# Patient Record
Sex: Male | Born: 1940 | Race: White | Hispanic: No | Marital: Married | Smoking: Former smoker
Health system: Southern US, Community
[De-identification: ages and names within clinical notes are randomized; demographics above are authoritative.]

## PROBLEM LIST (undated history)

## (undated) DIAGNOSIS — E669 Obesity, unspecified: Secondary | ICD-10-CM

## (undated) DIAGNOSIS — B0053 Herpesviral conjunctivitis: Secondary | ICD-10-CM

## (undated) DIAGNOSIS — R0600 Dyspnea, unspecified: Secondary | ICD-10-CM

## (undated) DIAGNOSIS — I2510 Atherosclerotic heart disease of native coronary artery without angina pectoris: Secondary | ICD-10-CM

## (undated) DIAGNOSIS — K5900 Constipation, unspecified: Secondary | ICD-10-CM

## (undated) DIAGNOSIS — K219 Gastro-esophageal reflux disease without esophagitis: Secondary | ICD-10-CM

## (undated) DIAGNOSIS — E871 Hypo-osmolality and hyponatremia: Secondary | ICD-10-CM

## (undated) DIAGNOSIS — R634 Abnormal weight loss: Secondary | ICD-10-CM

## (undated) DIAGNOSIS — R0609 Other forms of dyspnea: Secondary | ICD-10-CM

## (undated) DIAGNOSIS — E785 Hyperlipidemia, unspecified: Secondary | ICD-10-CM

## (undated) DIAGNOSIS — M25511 Pain in right shoulder: Principal | ICD-10-CM

## (undated) DIAGNOSIS — I10 Essential (primary) hypertension: Secondary | ICD-10-CM

## (undated) DIAGNOSIS — M1990 Unspecified osteoarthritis, unspecified site: Secondary | ICD-10-CM

## (undated) DIAGNOSIS — D539 Nutritional anemia, unspecified: Principal | ICD-10-CM

## (undated) DIAGNOSIS — E119 Type 2 diabetes mellitus without complications: Secondary | ICD-10-CM

## (undated) HISTORY — DX: Nutritional anemia, unspecified: D53.9

## (undated) HISTORY — DX: Other forms of dyspnea: R06.09

## (undated) HISTORY — DX: Essential (primary) hypertension: I10

## (undated) HISTORY — DX: Hyperlipidemia, unspecified: E78.5

## (undated) HISTORY — DX: Herpesviral conjunctivitis: B00.53

## (undated) HISTORY — PX: HERNIA REPAIR: SHX51

## (undated) HISTORY — DX: Gastro-esophageal reflux disease without esophagitis: K21.9

## (undated) HISTORY — DX: Atherosclerotic heart disease of native coronary artery without angina pectoris: I25.10

## (undated) HISTORY — DX: Unspecified osteoarthritis, unspecified site: M19.90

## (undated) HISTORY — DX: Hypo-osmolality and hyponatremia: E87.1

## (undated) HISTORY — DX: Obesity, unspecified: E66.9

## (undated) HISTORY — DX: Constipation, unspecified: K59.00

## (undated) HISTORY — DX: Abnormal weight loss: R63.4

## (undated) HISTORY — DX: Dyspnea, unspecified: R06.00

## (undated) HISTORY — DX: Type 2 diabetes mellitus without complications: E11.9

## (undated) HISTORY — DX: Pain in right shoulder: M25.511

---
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
## 1986-05-29 HISTORY — PX: BACK SURGERY: SHX140

## 2008-05-29 HISTORY — PX: CARDIAC CATHETERIZATION: SHX172

## 2009-03-29 HISTORY — PX: TRANSTHORACIC ECHOCARDIOGRAM: SHX275

## 2009-04-07 ENCOUNTER — Ambulatory Visit (HOSPITAL_COMMUNITY): Admission: RE | Admit: 2009-04-07 | Discharge: 2009-04-07 | Payer: Self-pay | Admitting: Cardiology

## 2010-08-31 LAB — GLUCOSE, CAPILLARY: Glucose-Capillary: 128 mg/dL — ABNORMAL HIGH (ref 70–99)

## 2011-06-08 DIAGNOSIS — E11311 Type 2 diabetes mellitus with unspecified diabetic retinopathy with macular edema: Secondary | ICD-10-CM | POA: Diagnosis not present

## 2011-06-08 DIAGNOSIS — E1139 Type 2 diabetes mellitus with other diabetic ophthalmic complication: Secondary | ICD-10-CM | POA: Diagnosis not present

## 2011-07-04 DIAGNOSIS — E7800 Pure hypercholesterolemia, unspecified: Secondary | ICD-10-CM | POA: Diagnosis not present

## 2011-07-04 DIAGNOSIS — E119 Type 2 diabetes mellitus without complications: Secondary | ICD-10-CM | POA: Diagnosis not present

## 2011-07-06 DIAGNOSIS — I10 Essential (primary) hypertension: Secondary | ICD-10-CM | POA: Diagnosis not present

## 2011-07-06 DIAGNOSIS — E7800 Pure hypercholesterolemia, unspecified: Secondary | ICD-10-CM | POA: Diagnosis not present

## 2011-07-12 DIAGNOSIS — L820 Inflamed seborrheic keratosis: Secondary | ICD-10-CM | POA: Diagnosis not present

## 2011-07-12 DIAGNOSIS — L570 Actinic keratosis: Secondary | ICD-10-CM | POA: Diagnosis not present

## 2011-07-13 DIAGNOSIS — E1139 Type 2 diabetes mellitus with other diabetic ophthalmic complication: Secondary | ICD-10-CM | POA: Diagnosis not present

## 2011-09-12 DIAGNOSIS — H179 Unspecified corneal scar and opacity: Secondary | ICD-10-CM | POA: Diagnosis not present

## 2011-09-12 DIAGNOSIS — N401 Enlarged prostate with lower urinary tract symptoms: Secondary | ICD-10-CM | POA: Diagnosis not present

## 2011-10-16 DIAGNOSIS — E7800 Pure hypercholesterolemia, unspecified: Secondary | ICD-10-CM | POA: Diagnosis not present

## 2011-10-19 DIAGNOSIS — E1139 Type 2 diabetes mellitus with other diabetic ophthalmic complication: Secondary | ICD-10-CM | POA: Diagnosis not present

## 2011-10-19 DIAGNOSIS — E11319 Type 2 diabetes mellitus with unspecified diabetic retinopathy without macular edema: Secondary | ICD-10-CM | POA: Diagnosis not present

## 2011-10-19 DIAGNOSIS — H2510 Age-related nuclear cataract, unspecified eye: Secondary | ICD-10-CM | POA: Diagnosis not present

## 2011-10-19 DIAGNOSIS — E11311 Type 2 diabetes mellitus with unspecified diabetic retinopathy with macular edema: Secondary | ICD-10-CM | POA: Diagnosis not present

## 2011-12-28 DIAGNOSIS — E11311 Type 2 diabetes mellitus with unspecified diabetic retinopathy with macular edema: Secondary | ICD-10-CM | POA: Diagnosis not present

## 2011-12-28 DIAGNOSIS — E7800 Pure hypercholesterolemia, unspecified: Secondary | ICD-10-CM | POA: Diagnosis not present

## 2011-12-28 DIAGNOSIS — E11319 Type 2 diabetes mellitus with unspecified diabetic retinopathy without macular edema: Secondary | ICD-10-CM | POA: Diagnosis not present

## 2011-12-28 DIAGNOSIS — Z79899 Other long term (current) drug therapy: Secondary | ICD-10-CM | POA: Diagnosis not present

## 2011-12-28 DIAGNOSIS — E1139 Type 2 diabetes mellitus with other diabetic ophthalmic complication: Secondary | ICD-10-CM | POA: Diagnosis not present

## 2012-01-04 DIAGNOSIS — E7800 Pure hypercholesterolemia, unspecified: Secondary | ICD-10-CM | POA: Diagnosis not present

## 2012-01-04 DIAGNOSIS — I10 Essential (primary) hypertension: Secondary | ICD-10-CM | POA: Diagnosis not present

## 2012-01-10 DIAGNOSIS — Z0000 Encounter for general adult medical examination without abnormal findings: Secondary | ICD-10-CM | POA: Diagnosis not present

## 2012-01-10 DIAGNOSIS — E7800 Pure hypercholesterolemia, unspecified: Secondary | ICD-10-CM | POA: Diagnosis not present

## 2012-01-15 DIAGNOSIS — H5700 Unspecified anomaly of pupillary function: Secondary | ICD-10-CM | POA: Diagnosis not present

## 2012-01-15 DIAGNOSIS — E1139 Type 2 diabetes mellitus with other diabetic ophthalmic complication: Secondary | ICD-10-CM | POA: Diagnosis not present

## 2012-01-15 DIAGNOSIS — H179 Unspecified corneal scar and opacity: Secondary | ICD-10-CM | POA: Diagnosis not present

## 2012-01-15 DIAGNOSIS — H259 Unspecified age-related cataract: Secondary | ICD-10-CM | POA: Diagnosis not present

## 2012-01-16 DIAGNOSIS — L570 Actinic keratosis: Secondary | ICD-10-CM | POA: Diagnosis not present

## 2012-01-16 DIAGNOSIS — N401 Enlarged prostate with lower urinary tract symptoms: Secondary | ICD-10-CM | POA: Diagnosis not present

## 2012-01-16 DIAGNOSIS — Z0000 Encounter for general adult medical examination without abnormal findings: Secondary | ICD-10-CM | POA: Diagnosis not present

## 2012-01-16 DIAGNOSIS — E7800 Pure hypercholesterolemia, unspecified: Secondary | ICD-10-CM | POA: Diagnosis not present

## 2012-01-16 DIAGNOSIS — I10 Essential (primary) hypertension: Secondary | ICD-10-CM | POA: Diagnosis not present

## 2012-02-27 HISTORY — PX: OTHER SURGICAL HISTORY: SHX169

## 2012-03-11 DIAGNOSIS — R0602 Shortness of breath: Secondary | ICD-10-CM | POA: Diagnosis not present

## 2012-03-11 DIAGNOSIS — E119 Type 2 diabetes mellitus without complications: Secondary | ICD-10-CM | POA: Diagnosis not present

## 2012-03-11 DIAGNOSIS — I10 Essential (primary) hypertension: Secondary | ICD-10-CM | POA: Diagnosis not present

## 2012-03-12 DIAGNOSIS — R0602 Shortness of breath: Secondary | ICD-10-CM | POA: Diagnosis not present

## 2012-03-14 DIAGNOSIS — Z23 Encounter for immunization: Secondary | ICD-10-CM | POA: Diagnosis not present

## 2012-04-03 DIAGNOSIS — R0609 Other forms of dyspnea: Secondary | ICD-10-CM | POA: Diagnosis not present

## 2012-04-03 DIAGNOSIS — R0989 Other specified symptoms and signs involving the circulatory and respiratory systems: Secondary | ICD-10-CM | POA: Diagnosis not present

## 2012-04-03 DIAGNOSIS — E782 Mixed hyperlipidemia: Secondary | ICD-10-CM | POA: Diagnosis not present

## 2012-04-03 DIAGNOSIS — I10 Essential (primary) hypertension: Secondary | ICD-10-CM | POA: Diagnosis not present

## 2012-04-04 DIAGNOSIS — E7800 Pure hypercholesterolemia, unspecified: Secondary | ICD-10-CM | POA: Diagnosis not present

## 2012-04-08 DIAGNOSIS — E7800 Pure hypercholesterolemia, unspecified: Secondary | ICD-10-CM | POA: Diagnosis not present

## 2012-04-08 DIAGNOSIS — I10 Essential (primary) hypertension: Secondary | ICD-10-CM | POA: Diagnosis not present

## 2012-07-05 DIAGNOSIS — E7800 Pure hypercholesterolemia, unspecified: Secondary | ICD-10-CM | POA: Diagnosis not present

## 2012-07-09 DIAGNOSIS — E1139 Type 2 diabetes mellitus with other diabetic ophthalmic complication: Secondary | ICD-10-CM | POA: Diagnosis not present

## 2012-07-09 DIAGNOSIS — E7800 Pure hypercholesterolemia, unspecified: Secondary | ICD-10-CM | POA: Diagnosis not present

## 2012-07-09 DIAGNOSIS — I10 Essential (primary) hypertension: Secondary | ICD-10-CM | POA: Diagnosis not present

## 2012-09-03 DIAGNOSIS — E1139 Type 2 diabetes mellitus with other diabetic ophthalmic complication: Secondary | ICD-10-CM | POA: Diagnosis not present

## 2012-09-03 DIAGNOSIS — E11319 Type 2 diabetes mellitus with unspecified diabetic retinopathy without macular edema: Secondary | ICD-10-CM | POA: Diagnosis not present

## 2012-09-03 DIAGNOSIS — E11311 Type 2 diabetes mellitus with unspecified diabetic retinopathy with macular edema: Secondary | ICD-10-CM | POA: Diagnosis not present

## 2012-09-10 DIAGNOSIS — E11319 Type 2 diabetes mellitus with unspecified diabetic retinopathy without macular edema: Secondary | ICD-10-CM | POA: Diagnosis not present

## 2012-09-10 DIAGNOSIS — E1139 Type 2 diabetes mellitus with other diabetic ophthalmic complication: Secondary | ICD-10-CM | POA: Diagnosis not present

## 2012-09-10 DIAGNOSIS — E11311 Type 2 diabetes mellitus with unspecified diabetic retinopathy with macular edema: Secondary | ICD-10-CM | POA: Diagnosis not present

## 2012-09-16 DIAGNOSIS — N401 Enlarged prostate with lower urinary tract symptoms: Secondary | ICD-10-CM | POA: Diagnosis not present

## 2012-10-08 DIAGNOSIS — E11311 Type 2 diabetes mellitus with unspecified diabetic retinopathy with macular edema: Secondary | ICD-10-CM | POA: Diagnosis not present

## 2012-10-08 DIAGNOSIS — E11319 Type 2 diabetes mellitus with unspecified diabetic retinopathy without macular edema: Secondary | ICD-10-CM | POA: Diagnosis not present

## 2012-10-08 DIAGNOSIS — E1139 Type 2 diabetes mellitus with other diabetic ophthalmic complication: Secondary | ICD-10-CM | POA: Diagnosis not present

## 2012-10-17 DIAGNOSIS — E7800 Pure hypercholesterolemia, unspecified: Secondary | ICD-10-CM | POA: Diagnosis not present

## 2012-10-22 DIAGNOSIS — I10 Essential (primary) hypertension: Secondary | ICD-10-CM | POA: Diagnosis not present

## 2012-10-22 DIAGNOSIS — E1139 Type 2 diabetes mellitus with other diabetic ophthalmic complication: Secondary | ICD-10-CM | POA: Diagnosis not present

## 2012-10-22 DIAGNOSIS — E7800 Pure hypercholesterolemia, unspecified: Secondary | ICD-10-CM | POA: Diagnosis not present

## 2012-10-31 DIAGNOSIS — E11319 Type 2 diabetes mellitus with unspecified diabetic retinopathy without macular edema: Secondary | ICD-10-CM | POA: Diagnosis not present

## 2012-10-31 DIAGNOSIS — E11311 Type 2 diabetes mellitus with unspecified diabetic retinopathy with macular edema: Secondary | ICD-10-CM | POA: Diagnosis not present

## 2012-10-31 DIAGNOSIS — E1139 Type 2 diabetes mellitus with other diabetic ophthalmic complication: Secondary | ICD-10-CM | POA: Diagnosis not present

## 2012-12-05 DIAGNOSIS — E11311 Type 2 diabetes mellitus with unspecified diabetic retinopathy with macular edema: Secondary | ICD-10-CM | POA: Diagnosis not present

## 2012-12-05 DIAGNOSIS — E11319 Type 2 diabetes mellitus with unspecified diabetic retinopathy without macular edema: Secondary | ICD-10-CM | POA: Diagnosis not present

## 2012-12-05 DIAGNOSIS — E1139 Type 2 diabetes mellitus with other diabetic ophthalmic complication: Secondary | ICD-10-CM | POA: Diagnosis not present

## 2012-12-11 DIAGNOSIS — R234 Changes in skin texture: Secondary | ICD-10-CM | POA: Diagnosis not present

## 2012-12-11 DIAGNOSIS — E1139 Type 2 diabetes mellitus with other diabetic ophthalmic complication: Secondary | ICD-10-CM | POA: Diagnosis not present

## 2012-12-11 DIAGNOSIS — W57XXXA Bitten or stung by nonvenomous insect and other nonvenomous arthropods, initial encounter: Secondary | ICD-10-CM | POA: Diagnosis not present

## 2012-12-11 DIAGNOSIS — J40 Bronchitis, not specified as acute or chronic: Secondary | ICD-10-CM | POA: Diagnosis not present

## 2012-12-11 DIAGNOSIS — Z006 Encounter for examination for normal comparison and control in clinical research program: Secondary | ICD-10-CM | POA: Diagnosis not present

## 2012-12-11 DIAGNOSIS — I10 Essential (primary) hypertension: Secondary | ICD-10-CM | POA: Diagnosis not present

## 2012-12-11 DIAGNOSIS — E7800 Pure hypercholesterolemia, unspecified: Secondary | ICD-10-CM | POA: Diagnosis not present

## 2012-12-11 DIAGNOSIS — S80869A Insect bite (nonvenomous), unspecified lower leg, initial encounter: Secondary | ICD-10-CM | POA: Diagnosis not present

## 2012-12-24 DIAGNOSIS — C44721 Squamous cell carcinoma of skin of unspecified lower limb, including hip: Secondary | ICD-10-CM | POA: Diagnosis not present

## 2012-12-24 DIAGNOSIS — L820 Inflamed seborrheic keratosis: Secondary | ICD-10-CM | POA: Diagnosis not present

## 2013-01-02 DIAGNOSIS — E1139 Type 2 diabetes mellitus with other diabetic ophthalmic complication: Secondary | ICD-10-CM | POA: Diagnosis not present

## 2013-01-02 DIAGNOSIS — E11319 Type 2 diabetes mellitus with unspecified diabetic retinopathy without macular edema: Secondary | ICD-10-CM | POA: Diagnosis not present

## 2013-01-02 DIAGNOSIS — E11359 Type 2 diabetes mellitus with proliferative diabetic retinopathy without macular edema: Secondary | ICD-10-CM | POA: Diagnosis not present

## 2013-01-02 DIAGNOSIS — E11311 Type 2 diabetes mellitus with unspecified diabetic retinopathy with macular edema: Secondary | ICD-10-CM | POA: Diagnosis not present

## 2013-01-08 DIAGNOSIS — H179 Unspecified corneal scar and opacity: Secondary | ICD-10-CM | POA: Diagnosis not present

## 2013-01-08 DIAGNOSIS — H5700 Unspecified anomaly of pupillary function: Secondary | ICD-10-CM | POA: Diagnosis not present

## 2013-01-08 DIAGNOSIS — E1139 Type 2 diabetes mellitus with other diabetic ophthalmic complication: Secondary | ICD-10-CM | POA: Diagnosis not present

## 2013-01-08 DIAGNOSIS — H259 Unspecified age-related cataract: Secondary | ICD-10-CM | POA: Diagnosis not present

## 2013-01-09 DIAGNOSIS — Z125 Encounter for screening for malignant neoplasm of prostate: Secondary | ICD-10-CM | POA: Diagnosis not present

## 2013-01-09 DIAGNOSIS — Z0000 Encounter for general adult medical examination without abnormal findings: Secondary | ICD-10-CM | POA: Diagnosis not present

## 2013-01-09 DIAGNOSIS — E7800 Pure hypercholesterolemia, unspecified: Secondary | ICD-10-CM | POA: Diagnosis not present

## 2013-01-09 DIAGNOSIS — I10 Essential (primary) hypertension: Secondary | ICD-10-CM | POA: Diagnosis not present

## 2013-01-10 DIAGNOSIS — Z85828 Personal history of other malignant neoplasm of skin: Secondary | ICD-10-CM | POA: Diagnosis not present

## 2013-01-10 DIAGNOSIS — C44721 Squamous cell carcinoma of skin of unspecified lower limb, including hip: Secondary | ICD-10-CM | POA: Diagnosis not present

## 2013-01-16 DIAGNOSIS — I2510 Atherosclerotic heart disease of native coronary artery without angina pectoris: Secondary | ICD-10-CM | POA: Diagnosis not present

## 2013-01-16 DIAGNOSIS — I10 Essential (primary) hypertension: Secondary | ICD-10-CM | POA: Diagnosis not present

## 2013-01-16 DIAGNOSIS — E7800 Pure hypercholesterolemia, unspecified: Secondary | ICD-10-CM | POA: Diagnosis not present

## 2013-01-16 DIAGNOSIS — E1139 Type 2 diabetes mellitus with other diabetic ophthalmic complication: Secondary | ICD-10-CM | POA: Diagnosis not present

## 2013-01-20 DIAGNOSIS — H259 Unspecified age-related cataract: Secondary | ICD-10-CM | POA: Diagnosis not present

## 2013-01-20 DIAGNOSIS — H1030 Unspecified acute conjunctivitis, unspecified eye: Secondary | ICD-10-CM | POA: Diagnosis not present

## 2013-01-30 DIAGNOSIS — E11311 Type 2 diabetes mellitus with unspecified diabetic retinopathy with macular edema: Secondary | ICD-10-CM | POA: Diagnosis not present

## 2013-01-30 DIAGNOSIS — E11319 Type 2 diabetes mellitus with unspecified diabetic retinopathy without macular edema: Secondary | ICD-10-CM | POA: Diagnosis not present

## 2013-01-30 DIAGNOSIS — E1139 Type 2 diabetes mellitus with other diabetic ophthalmic complication: Secondary | ICD-10-CM | POA: Diagnosis not present

## 2013-03-07 ENCOUNTER — Ambulatory Visit: Payer: Self-pay | Admitting: Internal Medicine

## 2013-03-19 DIAGNOSIS — Z23 Encounter for immunization: Secondary | ICD-10-CM | POA: Diagnosis not present

## 2013-03-20 DIAGNOSIS — E11319 Type 2 diabetes mellitus with unspecified diabetic retinopathy without macular edema: Secondary | ICD-10-CM | POA: Diagnosis not present

## 2013-03-20 DIAGNOSIS — E11311 Type 2 diabetes mellitus with unspecified diabetic retinopathy with macular edema: Secondary | ICD-10-CM | POA: Diagnosis not present

## 2013-03-20 DIAGNOSIS — E1139 Type 2 diabetes mellitus with other diabetic ophthalmic complication: Secondary | ICD-10-CM | POA: Diagnosis not present

## 2013-03-27 DIAGNOSIS — E7800 Pure hypercholesterolemia, unspecified: Secondary | ICD-10-CM | POA: Diagnosis not present

## 2013-03-27 DIAGNOSIS — E1139 Type 2 diabetes mellitus with other diabetic ophthalmic complication: Secondary | ICD-10-CM | POA: Diagnosis not present

## 2013-03-30 ENCOUNTER — Encounter: Payer: Self-pay | Admitting: *Deleted

## 2013-03-31 ENCOUNTER — Encounter: Payer: Self-pay | Admitting: Internal Medicine

## 2013-03-31 DIAGNOSIS — E875 Hyperkalemia: Secondary | ICD-10-CM | POA: Diagnosis not present

## 2013-03-31 DIAGNOSIS — I10 Essential (primary) hypertension: Secondary | ICD-10-CM | POA: Diagnosis not present

## 2013-03-31 DIAGNOSIS — I259 Chronic ischemic heart disease, unspecified: Secondary | ICD-10-CM | POA: Diagnosis not present

## 2013-04-01 ENCOUNTER — Encounter: Payer: Self-pay | Admitting: Internal Medicine

## 2013-04-01 ENCOUNTER — Ambulatory Visit (INDEPENDENT_AMBULATORY_CARE_PROVIDER_SITE_OTHER): Payer: Medicare Other | Admitting: Internal Medicine

## 2013-04-01 VITALS — BP 150/80 | HR 69 | Ht 67.0 in | Wt 192.9 lb

## 2013-04-01 DIAGNOSIS — E785 Hyperlipidemia, unspecified: Secondary | ICD-10-CM

## 2013-04-01 DIAGNOSIS — I2510 Atherosclerotic heart disease of native coronary artery without angina pectoris: Secondary | ICD-10-CM | POA: Diagnosis not present

## 2013-04-01 DIAGNOSIS — I10 Essential (primary) hypertension: Secondary | ICD-10-CM

## 2013-04-01 DIAGNOSIS — E119 Type 2 diabetes mellitus without complications: Secondary | ICD-10-CM

## 2013-04-01 NOTE — Progress Notes (Signed)
OFFICE NOTE  Chief Complaint:  Routine follow-up  Primary Care Physician: Londell Moh, MD  HPI:  Dustin Mccarthy  Is a 72 year old gentleman who has diabetes type 2, hypertension, dyslipidemia, and obesity. He is on insulin therapy and had a heart catheterization in 2010, which was negative, because of an abnormal stress test. He has also recently had some worsening shortness of breath and difficulty with weight loss. I recommended a metabolic test to further evaluate his shortness of breath. That was performed on March 12, 2012. It showed an RER of 1.06, a peak VO2 of 84% predicted. Heart rate was 87% predicted and the heart rate and VO2 curve showed a good agreement until anaerobic threshold with some flattening of his VO2 curve suggestive of an ischemic response. The VO2, however, is high enough and greater than 60, typically a cutoff for underlying coronary disease. This is a low-risk study and suggests small vessel ischemia. I recommended aerobic exercise and adding l-arginine 3 g p.o. t.i.d. to the diet. He actually did both of these things and reported some marked improvement in his shortness of breath. He did gardening for about 6 months and discontinued it. His exercise is ongoing he is managed to lose 6-8 pounds. He says he feels better and is not bothered by shortness of breath. He been followed closely by Demetrius Charity (PharmD) at Dr. Lauree Chandler office. He reports that his diabetes is fairly well controlled with an A1c of 6.9. He is cholesterol is also at goal.  PMHx:  Past Medical History  Diagnosis Date  . Type 2 diabetes mellitus   . Hypertension   . Hyperlipidemia   . Obesity   . CAD (coronary artery disease)     mild (cath 2010)  . DOE (dyspnea on exertion)     Past Surgical History  Procedure Laterality Date  . Cardiac catheterization  2010    after abnormal stress test (03/2009) - mild coronary disease  . Cardiometabolic testing  02/2012    RER of 1.06, peak  VO2 84% predicted; HR 87% predicted  . Back surgery  1988  . Hernia repair    . Skull fracture surgery  1945  . Transthoracic echocardiogram  03/2009    EF 45-50%, mild conc LVH, mod septal hypokinesis, mod apical wall hypokinesis; RV systolic function borderline reduced; trace MR/TR    FAMHx:  Family History  Problem Relation Age of Onset  . Hypertension Mother   . Diabetes Mother   . Heart attack Mother   . Stroke Mother   . Parkinson's disease Father   . Heart disease Father   . Diabetes Child     SOCHx:   reports that he quit smoking about 34 years ago. He has never used smokeless tobacco. He reports that he drinks about 1.5 ounces of alcohol per week. He reports that he does not use illicit drugs.  ALLERGIES:  Allergies  Allergen Reactions  . Bee Venom   . Iodine     ROS: A comprehensive review of systems was negative except for: Respiratory: positive for dyspnea on exertion  HOME MEDS: Current Outpatient Prescriptions  Medication Sig Dispense Refill  . acyclovir (ZOVIRAX) 400 MG tablet Take 400 mg by mouth 2 (two) times daily.      Marland Kitchen amLODipine (NORVASC) 5 MG tablet Take 5 mg by mouth daily.      Marland Kitchen aspirin 81 MG tablet Take 81 mg by mouth daily.      Marland Kitchen atorvastatin (LIPITOR) 40 MG  tablet Take 40 mg by mouth daily.      . finasteride (PROSCAR) 5 MG tablet Take 5 mg by mouth daily.      . insulin glargine (LANTUS) 100 UNIT/ML injection Inject 20 Units into the skin at bedtime.       . insulin glulisine (APIDRA) 100 UNIT/ML injection Inject 8 Units into the skin 2 (two) times daily with a meal.       . lisinopril (PRINIVIL,ZESTRIL) 10 MG tablet Take 10 mg by mouth daily.      . metFORMIN (GLUCOPHAGE) 1000 MG tablet Take 1,000 mg by mouth 2 (two) times daily with a meal.      . ranitidine (ZANTAC) 150 MG capsule Take 150 mg by mouth every evening.      . tamsulosin (FLOMAX) 0.4 MG CAPS capsule Take 0.4 mg by mouth daily.      . Thiamine HCl (VITAMIN B-1 PO) Take by  mouth daily.       No current facility-administered medications for this visit.    LABS/IMAGING: No results found for this or any previous visit (from the past 48 hour(s)). No results found.  VITALS: BP 150/80  Pulse 69  Ht 5\' 7"  (1.702 m)  Wt 192 lb 14.4 oz (87.499 kg)  BMI 30.21 kg/m2  EXAM: General appearance: alert and no distress Neck: no carotid bruit and no JVD Lungs: clear to auscultation bilaterally Heart: regular rate and rhythm, S1, S2 normal, no murmur, click, rub or gallop Abdomen: soft, non-tender; bowel sounds normal; no masses,  no organomegaly Extremities: extremities normal, atraumatic, no cyanosis or edema Pulses: 2+ and symmetric Skin: Skin color, texture, turgor normal. No rashes or lesions Neurologic: Grossly normal Psych: Mood, affect normal  EKG: Sinus rhythm with first degree AV block at 69  ASSESSMENT: 1. Hypertension-controlled (repeat bp was 128/80) 2. Dyslipidemia on atorvastatin-at goal 3.  Insulin-dependent diabetes-A1c 6.9 4. Dyspnea-improved 5. Mild coronary artery disease by cath in 2010  PLAN: 1.   [name] is doing quite well and his dyspnea has improved. He continued exercise and has lost some weight which I think is overall beneficial for him. His blood sugars are well controlled his cholesterol is at goal. His blood pressures also at control. Overall his are well continued encouraged exercise and healthy eating habits. Plan to see him back annually or sooner as necessary.  Chrystie Nose, MD, [facility] Attending Cardiologist [facility]  HILTY,Kenneth C 04/01/2013, 6:23 PM

## 2013-04-01 NOTE — Patient Instructions (Signed)
Your physician wants you to follow-up in: 1 year. You will receive a reminder letter in the mail two months in advance. If you don't receive a letter, please call our office to schedule the follow-up appointment.  

## 2013-05-05 DIAGNOSIS — E785 Hyperlipidemia, unspecified: Secondary | ICD-10-CM | POA: Diagnosis not present

## 2013-05-05 DIAGNOSIS — J069 Acute upper respiratory infection, unspecified: Secondary | ICD-10-CM | POA: Diagnosis not present

## 2013-05-05 DIAGNOSIS — I10 Essential (primary) hypertension: Secondary | ICD-10-CM | POA: Diagnosis not present

## 2013-05-05 DIAGNOSIS — E119 Type 2 diabetes mellitus without complications: Secondary | ICD-10-CM | POA: Diagnosis not present

## 2013-05-05 DIAGNOSIS — N400 Enlarged prostate without lower urinary tract symptoms: Secondary | ICD-10-CM | POA: Diagnosis not present

## 2013-06-19 DIAGNOSIS — E11319 Type 2 diabetes mellitus with unspecified diabetic retinopathy without macular edema: Secondary | ICD-10-CM | POA: Diagnosis not present

## 2013-06-19 DIAGNOSIS — H2510 Age-related nuclear cataract, unspecified eye: Secondary | ICD-10-CM | POA: Diagnosis not present

## 2013-06-19 DIAGNOSIS — E11311 Type 2 diabetes mellitus with unspecified diabetic retinopathy with macular edema: Secondary | ICD-10-CM | POA: Diagnosis not present

## 2013-06-19 DIAGNOSIS — E1139 Type 2 diabetes mellitus with other diabetic ophthalmic complication: Secondary | ICD-10-CM | POA: Diagnosis not present

## 2013-06-26 DIAGNOSIS — IMO0001 Reserved for inherently not codable concepts without codable children: Secondary | ICD-10-CM | POA: Diagnosis not present

## 2013-06-26 DIAGNOSIS — E7800 Pure hypercholesterolemia, unspecified: Secondary | ICD-10-CM | POA: Diagnosis not present

## 2013-07-01 DIAGNOSIS — I2510 Atherosclerotic heart disease of native coronary artery without angina pectoris: Secondary | ICD-10-CM | POA: Diagnosis not present

## 2013-07-01 DIAGNOSIS — IMO0001 Reserved for inherently not codable concepts without codable children: Secondary | ICD-10-CM | POA: Diagnosis not present

## 2013-07-01 DIAGNOSIS — E7800 Pure hypercholesterolemia, unspecified: Secondary | ICD-10-CM | POA: Diagnosis not present

## 2013-07-01 DIAGNOSIS — I10 Essential (primary) hypertension: Secondary | ICD-10-CM | POA: Diagnosis not present

## 2013-07-07 DIAGNOSIS — E1139 Type 2 diabetes mellitus with other diabetic ophthalmic complication: Secondary | ICD-10-CM | POA: Diagnosis not present

## 2013-07-07 DIAGNOSIS — H3581 Retinal edema: Secondary | ICD-10-CM | POA: Diagnosis not present

## 2013-07-07 DIAGNOSIS — E11319 Type 2 diabetes mellitus with unspecified diabetic retinopathy without macular edema: Secondary | ICD-10-CM | POA: Diagnosis not present

## 2013-07-07 DIAGNOSIS — H259 Unspecified age-related cataract: Secondary | ICD-10-CM | POA: Diagnosis not present

## 2013-09-18 DIAGNOSIS — N139 Obstructive and reflux uropathy, unspecified: Secondary | ICD-10-CM | POA: Diagnosis not present

## 2013-09-18 DIAGNOSIS — E11311 Type 2 diabetes mellitus with unspecified diabetic retinopathy with macular edema: Secondary | ICD-10-CM | POA: Diagnosis not present

## 2013-09-18 DIAGNOSIS — E1139 Type 2 diabetes mellitus with other diabetic ophthalmic complication: Secondary | ICD-10-CM | POA: Diagnosis not present

## 2013-09-18 DIAGNOSIS — H2510 Age-related nuclear cataract, unspecified eye: Secondary | ICD-10-CM | POA: Diagnosis not present

## 2013-09-18 DIAGNOSIS — E11319 Type 2 diabetes mellitus with unspecified diabetic retinopathy without macular edema: Secondary | ICD-10-CM | POA: Diagnosis not present

## 2013-09-18 DIAGNOSIS — N401 Enlarged prostate with lower urinary tract symptoms: Secondary | ICD-10-CM | POA: Diagnosis not present

## 2013-09-23 DIAGNOSIS — Z85828 Personal history of other malignant neoplasm of skin: Secondary | ICD-10-CM | POA: Diagnosis not present

## 2013-09-23 DIAGNOSIS — L821 Other seborrheic keratosis: Secondary | ICD-10-CM | POA: Diagnosis not present

## 2013-09-23 DIAGNOSIS — D1801 Hemangioma of skin and subcutaneous tissue: Secondary | ICD-10-CM | POA: Diagnosis not present

## 2013-09-23 DIAGNOSIS — L570 Actinic keratosis: Secondary | ICD-10-CM | POA: Diagnosis not present

## 2013-09-23 DIAGNOSIS — L819 Disorder of pigmentation, unspecified: Secondary | ICD-10-CM | POA: Diagnosis not present

## 2013-09-26 DIAGNOSIS — IMO0001 Reserved for inherently not codable concepts without codable children: Secondary | ICD-10-CM | POA: Diagnosis not present

## 2013-09-26 DIAGNOSIS — E7800 Pure hypercholesterolemia, unspecified: Secondary | ICD-10-CM | POA: Diagnosis not present

## 2013-10-02 DIAGNOSIS — E7800 Pure hypercholesterolemia, unspecified: Secondary | ICD-10-CM | POA: Diagnosis not present

## 2013-10-02 DIAGNOSIS — IMO0001 Reserved for inherently not codable concepts without codable children: Secondary | ICD-10-CM | POA: Diagnosis not present

## 2013-10-02 DIAGNOSIS — I10 Essential (primary) hypertension: Secondary | ICD-10-CM | POA: Diagnosis not present

## 2014-01-12 DIAGNOSIS — M549 Dorsalgia, unspecified: Secondary | ICD-10-CM | POA: Diagnosis not present

## 2014-01-13 DIAGNOSIS — M999 Biomechanical lesion, unspecified: Secondary | ICD-10-CM | POA: Diagnosis not present

## 2014-01-13 DIAGNOSIS — M5137 Other intervertebral disc degeneration, lumbosacral region: Secondary | ICD-10-CM | POA: Diagnosis not present

## 2014-01-13 DIAGNOSIS — S139XXA Sprain of joints and ligaments of unspecified parts of neck, initial encounter: Secondary | ICD-10-CM | POA: Diagnosis not present

## 2014-01-13 DIAGNOSIS — M9981 Other biomechanical lesions of cervical region: Secondary | ICD-10-CM | POA: Diagnosis not present

## 2014-01-13 DIAGNOSIS — M47817 Spondylosis without myelopathy or radiculopathy, lumbosacral region: Secondary | ICD-10-CM | POA: Diagnosis not present

## 2014-01-13 DIAGNOSIS — S239XXA Sprain of unspecified parts of thorax, initial encounter: Secondary | ICD-10-CM | POA: Diagnosis not present

## 2014-01-15 DIAGNOSIS — M5137 Other intervertebral disc degeneration, lumbosacral region: Secondary | ICD-10-CM | POA: Diagnosis not present

## 2014-01-15 DIAGNOSIS — S139XXA Sprain of joints and ligaments of unspecified parts of neck, initial encounter: Secondary | ICD-10-CM | POA: Diagnosis not present

## 2014-01-15 DIAGNOSIS — S239XXA Sprain of unspecified parts of thorax, initial encounter: Secondary | ICD-10-CM | POA: Diagnosis not present

## 2014-01-15 DIAGNOSIS — M9981 Other biomechanical lesions of cervical region: Secondary | ICD-10-CM | POA: Diagnosis not present

## 2014-01-15 DIAGNOSIS — M999 Biomechanical lesion, unspecified: Secondary | ICD-10-CM | POA: Diagnosis not present

## 2014-01-20 DIAGNOSIS — H2510 Age-related nuclear cataract, unspecified eye: Secondary | ICD-10-CM | POA: Diagnosis not present

## 2014-01-20 DIAGNOSIS — E1039 Type 1 diabetes mellitus with other diabetic ophthalmic complication: Secondary | ICD-10-CM | POA: Diagnosis not present

## 2014-01-20 DIAGNOSIS — E1139 Type 2 diabetes mellitus with other diabetic ophthalmic complication: Secondary | ICD-10-CM | POA: Diagnosis not present

## 2014-01-21 DIAGNOSIS — M542 Cervicalgia: Secondary | ICD-10-CM | POA: Diagnosis not present

## 2014-01-21 DIAGNOSIS — I10 Essential (primary) hypertension: Secondary | ICD-10-CM | POA: Diagnosis not present

## 2014-01-21 DIAGNOSIS — Z125 Encounter for screening for malignant neoplasm of prostate: Secondary | ICD-10-CM | POA: Diagnosis not present

## 2014-01-21 DIAGNOSIS — Z0000 Encounter for general adult medical examination without abnormal findings: Secondary | ICD-10-CM | POA: Diagnosis not present

## 2014-01-21 DIAGNOSIS — IMO0001 Reserved for inherently not codable concepts without codable children: Secondary | ICD-10-CM | POA: Diagnosis not present

## 2014-01-21 DIAGNOSIS — S239XXA Sprain of unspecified parts of thorax, initial encounter: Secondary | ICD-10-CM | POA: Diagnosis not present

## 2014-01-21 DIAGNOSIS — E7800 Pure hypercholesterolemia, unspecified: Secondary | ICD-10-CM | POA: Diagnosis not present

## 2014-01-21 DIAGNOSIS — M47812 Spondylosis without myelopathy or radiculopathy, cervical region: Secondary | ICD-10-CM | POA: Diagnosis not present

## 2014-01-21 DIAGNOSIS — M9981 Other biomechanical lesions of cervical region: Secondary | ICD-10-CM | POA: Diagnosis not present

## 2014-01-21 DIAGNOSIS — M5137 Other intervertebral disc degeneration, lumbosacral region: Secondary | ICD-10-CM | POA: Diagnosis not present

## 2014-01-21 DIAGNOSIS — S139XXA Sprain of joints and ligaments of unspecified parts of neck, initial encounter: Secondary | ICD-10-CM | POA: Diagnosis not present

## 2014-01-21 DIAGNOSIS — M999 Biomechanical lesion, unspecified: Secondary | ICD-10-CM | POA: Diagnosis not present

## 2014-01-23 DIAGNOSIS — M999 Biomechanical lesion, unspecified: Secondary | ICD-10-CM | POA: Diagnosis not present

## 2014-01-23 DIAGNOSIS — M5137 Other intervertebral disc degeneration, lumbosacral region: Secondary | ICD-10-CM | POA: Diagnosis not present

## 2014-01-23 DIAGNOSIS — M4712 Other spondylosis with myelopathy, cervical region: Secondary | ICD-10-CM | POA: Diagnosis not present

## 2014-01-23 DIAGNOSIS — M9981 Other biomechanical lesions of cervical region: Secondary | ICD-10-CM | POA: Diagnosis not present

## 2014-01-23 DIAGNOSIS — S239XXA Sprain of unspecified parts of thorax, initial encounter: Secondary | ICD-10-CM | POA: Diagnosis not present

## 2014-01-26 ENCOUNTER — Other Ambulatory Visit: Payer: Self-pay | Admitting: Internal Medicine

## 2014-01-26 DIAGNOSIS — I6529 Occlusion and stenosis of unspecified carotid artery: Secondary | ICD-10-CM

## 2014-01-27 DIAGNOSIS — M999 Biomechanical lesion, unspecified: Secondary | ICD-10-CM | POA: Diagnosis not present

## 2014-01-27 DIAGNOSIS — M5137 Other intervertebral disc degeneration, lumbosacral region: Secondary | ICD-10-CM | POA: Diagnosis not present

## 2014-01-27 DIAGNOSIS — M9981 Other biomechanical lesions of cervical region: Secondary | ICD-10-CM | POA: Diagnosis not present

## 2014-01-27 DIAGNOSIS — S239XXA Sprain of unspecified parts of thorax, initial encounter: Secondary | ICD-10-CM | POA: Diagnosis not present

## 2014-01-27 DIAGNOSIS — M4712 Other spondylosis with myelopathy, cervical region: Secondary | ICD-10-CM | POA: Diagnosis not present

## 2014-01-29 DIAGNOSIS — S239XXA Sprain of unspecified parts of thorax, initial encounter: Secondary | ICD-10-CM | POA: Diagnosis not present

## 2014-01-29 DIAGNOSIS — M999 Biomechanical lesion, unspecified: Secondary | ICD-10-CM | POA: Diagnosis not present

## 2014-01-29 DIAGNOSIS — M9981 Other biomechanical lesions of cervical region: Secondary | ICD-10-CM | POA: Diagnosis not present

## 2014-01-29 DIAGNOSIS — M5137 Other intervertebral disc degeneration, lumbosacral region: Secondary | ICD-10-CM | POA: Diagnosis not present

## 2014-01-29 DIAGNOSIS — M4712 Other spondylosis with myelopathy, cervical region: Secondary | ICD-10-CM | POA: Diagnosis not present

## 2014-01-30 ENCOUNTER — Ambulatory Visit
Admission: RE | Admit: 2014-01-30 | Discharge: 2014-01-30 | Disposition: A | Discharge status: Home / Self Care | Payer: Medicare Other | Source: Ambulatory Visit | Attending: Internal Medicine | Admitting: Internal Medicine

## 2014-01-30 DIAGNOSIS — I6529 Occlusion and stenosis of unspecified carotid artery: Secondary | ICD-10-CM

## 2014-01-30 DIAGNOSIS — E7800 Pure hypercholesterolemia, unspecified: Secondary | ICD-10-CM | POA: Diagnosis not present

## 2014-01-30 DIAGNOSIS — D649 Anemia, unspecified: Secondary | ICD-10-CM | POA: Diagnosis not present

## 2014-01-30 DIAGNOSIS — I658 Occlusion and stenosis of other precerebral arteries: Secondary | ICD-10-CM | POA: Diagnosis not present

## 2014-01-30 DIAGNOSIS — E871 Hypo-osmolality and hyponatremia: Secondary | ICD-10-CM | POA: Diagnosis not present

## 2014-02-03 DIAGNOSIS — M4712 Other spondylosis with myelopathy, cervical region: Secondary | ICD-10-CM | POA: Diagnosis not present

## 2014-02-03 DIAGNOSIS — M5137 Other intervertebral disc degeneration, lumbosacral region: Secondary | ICD-10-CM | POA: Diagnosis not present

## 2014-02-03 DIAGNOSIS — M9981 Other biomechanical lesions of cervical region: Secondary | ICD-10-CM | POA: Diagnosis not present

## 2014-02-03 DIAGNOSIS — M999 Biomechanical lesion, unspecified: Secondary | ICD-10-CM | POA: Diagnosis not present

## 2014-02-03 DIAGNOSIS — S239XXA Sprain of unspecified parts of thorax, initial encounter: Secondary | ICD-10-CM | POA: Diagnosis not present

## 2014-02-04 ENCOUNTER — Telehealth: Payer: Self-pay | Admitting: Hematology and Oncology

## 2014-02-04 NOTE — Telephone Encounter (Signed)
LEFT MESSAGE FOR PATIENT AND GAVE NP APPT FOR 09/21 @ 10:45 W/DR. [place]. CONTACT INFORMATION LEFT FOR PATIENT TO RETURN CALL TO CONFIRM APPT.

## 2014-02-05 DIAGNOSIS — IMO0001 Reserved for inherently not codable concepts without codable children: Secondary | ICD-10-CM | POA: Diagnosis not present

## 2014-02-05 DIAGNOSIS — D649 Anemia, unspecified: Secondary | ICD-10-CM | POA: Diagnosis not present

## 2014-02-05 DIAGNOSIS — D72829 Elevated white blood cell count, unspecified: Secondary | ICD-10-CM | POA: Diagnosis not present

## 2014-02-05 DIAGNOSIS — M2550 Pain in unspecified joint: Secondary | ICD-10-CM | POA: Diagnosis not present

## 2014-02-05 DIAGNOSIS — E871 Hypo-osmolality and hyponatremia: Secondary | ICD-10-CM | POA: Diagnosis not present

## 2014-02-09 DIAGNOSIS — I10 Essential (primary) hypertension: Secondary | ICD-10-CM | POA: Diagnosis not present

## 2014-02-09 DIAGNOSIS — IMO0001 Reserved for inherently not codable concepts without codable children: Secondary | ICD-10-CM | POA: Diagnosis not present

## 2014-02-09 DIAGNOSIS — E7800 Pure hypercholesterolemia, unspecified: Secondary | ICD-10-CM | POA: Diagnosis not present

## 2014-02-11 DIAGNOSIS — E236 Other disorders of pituitary gland: Secondary | ICD-10-CM | POA: Diagnosis not present

## 2014-02-11 DIAGNOSIS — R0609 Other forms of dyspnea: Secondary | ICD-10-CM | POA: Diagnosis not present

## 2014-02-11 DIAGNOSIS — R5381 Other malaise: Secondary | ICD-10-CM | POA: Diagnosis not present

## 2014-02-11 DIAGNOSIS — Z888 Allergy status to other drugs, medicaments and biological substances status: Secondary | ICD-10-CM | POA: Diagnosis not present

## 2014-02-11 DIAGNOSIS — M9981 Other biomechanical lesions of cervical region: Secondary | ICD-10-CM | POA: Diagnosis not present

## 2014-02-11 DIAGNOSIS — M999 Biomechanical lesion, unspecified: Secondary | ICD-10-CM | POA: Diagnosis not present

## 2014-02-11 DIAGNOSIS — S239XXA Sprain of unspecified parts of thorax, initial encounter: Secondary | ICD-10-CM | POA: Diagnosis not present

## 2014-02-11 DIAGNOSIS — E7800 Pure hypercholesterolemia, unspecified: Secondary | ICD-10-CM | POA: Diagnosis not present

## 2014-02-11 DIAGNOSIS — R5383 Other fatigue: Secondary | ICD-10-CM | POA: Diagnosis not present

## 2014-02-11 DIAGNOSIS — R609 Edema, unspecified: Secondary | ICD-10-CM | POA: Diagnosis not present

## 2014-02-11 DIAGNOSIS — M4712 Other spondylosis with myelopathy, cervical region: Secondary | ICD-10-CM | POA: Diagnosis not present

## 2014-02-11 DIAGNOSIS — M5137 Other intervertebral disc degeneration, lumbosacral region: Secondary | ICD-10-CM | POA: Diagnosis not present

## 2014-02-16 ENCOUNTER — Encounter: Payer: Self-pay | Admitting: Hematology and Oncology

## 2014-02-16 ENCOUNTER — Ambulatory Visit (HOSPITAL_BASED_OUTPATIENT_CLINIC_OR_DEPARTMENT_OTHER): Payer: Medicare Other | Admitting: Hematology and Oncology

## 2014-02-16 ENCOUNTER — Ambulatory Visit: Payer: Medicare Other

## 2014-02-16 ENCOUNTER — Encounter (INDEPENDENT_AMBULATORY_CARE_PROVIDER_SITE_OTHER): Payer: Self-pay

## 2014-02-16 ENCOUNTER — Ambulatory Visit (HOSPITAL_BASED_OUTPATIENT_CLINIC_OR_DEPARTMENT_OTHER): Payer: Medicare Other

## 2014-02-16 ENCOUNTER — Telehealth: Payer: Self-pay | Admitting: Hematology and Oncology

## 2014-02-16 VITALS — BP 143/65 | HR 83 | Temp 98.6°F | Resp 20 | Ht 65.0 in | Wt 184.2 lb

## 2014-02-16 DIAGNOSIS — Z1212 Encounter for screening for malignant neoplasm of rectum: Secondary | ICD-10-CM | POA: Diagnosis not present

## 2014-02-16 DIAGNOSIS — R634 Abnormal weight loss: Secondary | ICD-10-CM | POA: Diagnosis not present

## 2014-02-16 DIAGNOSIS — D539 Nutritional anemia, unspecified: Secondary | ICD-10-CM

## 2014-02-16 DIAGNOSIS — D72829 Elevated white blood cell count, unspecified: Secondary | ICD-10-CM | POA: Diagnosis not present

## 2014-02-16 HISTORY — DX: Nutritional anemia, unspecified: D53.9

## 2014-02-16 HISTORY — DX: Abnormal weight loss: R63.4

## 2014-02-16 LAB — TSH CHCC: TSH: 1.824 m(IU)/L (ref 0.320–4.118)

## 2014-02-16 LAB — COMPREHENSIVE METABOLIC PANEL (CC13)
ALBUMIN: 3.5 g/dL (ref 3.5–5.0)
ALT: 12 U/L (ref 0–55)
ANION GAP: 12 meq/L — AB (ref 3–11)
AST: 15 U/L (ref 5–34)
Alkaline Phosphatase: 63 U/L (ref 40–150)
BUN: 17.9 mg/dL (ref 7.0–26.0)
CALCIUM: 9.2 mg/dL (ref 8.4–10.4)
CHLORIDE: 100 meq/L (ref 98–109)
CO2: 20 meq/L — AB (ref 22–29)
Creatinine: 0.9 mg/dL (ref 0.7–1.3)
Glucose: 146 mg/dl — ABNORMAL HIGH (ref 70–140)
POTASSIUM: 4.6 meq/L (ref 3.5–5.1)
Sodium: 132 mEq/L — ABNORMAL LOW (ref 136–145)
Total Bilirubin: 0.38 mg/dL (ref 0.20–1.20)
Total Protein: 7.2 g/dL (ref 6.4–8.3)

## 2014-02-16 LAB — CBC & DIFF AND RETIC
BASO%: 0.1 % (ref 0.0–2.0)
BASOS ABS: 0 10*3/uL (ref 0.0–0.1)
EOS ABS: 0 10*3/uL (ref 0.0–0.5)
EOS%: 0.4 % (ref 0.0–7.0)
HEMATOCRIT: 34.2 % — AB (ref 38.4–49.9)
HEMOGLOBIN: 11.3 g/dL — AB (ref 13.0–17.1)
IMMATURE RETIC FRACT: 3.3 % (ref 3.00–10.60)
LYMPH#: 1.6 10*3/uL (ref 0.9–3.3)
LYMPH%: 13.8 % — ABNORMAL LOW (ref 14.0–49.0)
MCH: 28.5 pg (ref 27.2–33.4)
MCHC: 33 g/dL (ref 32.0–36.0)
MCV: 86.1 fL (ref 79.3–98.0)
MONO#: 1 10*3/uL — ABNORMAL HIGH (ref 0.1–0.9)
MONO%: 8.8 % (ref 0.0–14.0)
NEUT%: 76.9 % — AB (ref 39.0–75.0)
NEUTROS ABS: 8.7 10*3/uL — AB (ref 1.5–6.5)
Platelets: 421 10*3/uL — ABNORMAL HIGH (ref 140–400)
RBC: 3.97 10*6/uL — ABNORMAL LOW (ref 4.20–5.82)
RDW: 13.9 % (ref 11.0–14.6)
RETIC CT ABS: 50.42 10*3/uL (ref 34.80–93.90)
Retic %: 1.27 % (ref 0.80–1.80)
WBC: 11.3 10*3/uL — ABNORMAL HIGH (ref 4.0–10.3)

## 2014-02-16 LAB — IRON AND TIBC CHCC
%SAT: 12 % — AB (ref 20–55)
Iron: 34 ug/dL — ABNORMAL LOW (ref 42–163)
TIBC: 291 ug/dL (ref 202–409)
UIBC: 257 ug/dL (ref 117–376)

## 2014-02-16 LAB — CHCC SMEAR

## 2014-02-16 LAB — LACTATE DEHYDROGENASE (CC13): LDH: 158 U/L (ref 125–245)

## 2014-02-16 LAB — FERRITIN CHCC: FERRITIN: 109 ng/mL (ref 22–316)

## 2014-02-16 NOTE — Assessment & Plan Note (Signed)
This is likely anemia of chronic disease. The patient denies recent history of bleeding such as epistaxis, hematuria or hematochezia. He is asymptomatic from the anemia. We will observe for now.  He does not require transfusion now. I will order additional workup.

## 2014-02-16 NOTE — Assessment & Plan Note (Signed)
I suspect this is reactive in nature, likely due to some form of unknown infection. He is improving. I recommend observation only for now.

## 2014-02-16 NOTE — Progress Notes (Signed)
Checked in new patient with no financial issues prior to seeing the dr. He has primary and secondary insurance. He has not been out of the country and he has his appt crd.

## 2014-02-16 NOTE — Telephone Encounter (Signed)
gv adn printed appt sched and avs for pt for Sept...sent pt to lab

## 2014-02-16 NOTE — Assessment & Plan Note (Signed)
The cause is unknown. I will order an additional workup including thyroid function tests.

## 2014-02-16 NOTE — Progress Notes (Signed)
[place] NOTE  Patient Care Team: Horatio Pel, MD as PCP - General (Internal Medicine) Heath Lark, MD as Consulting Physician (Hematology and Oncology)  CHIEF COMPLAINTS/PURPOSE OF CONSULTATION:  Leukocytosis, thrombocytosis and anemia  HISTORY OF PRESENTING ILLNESS:  Dustin Mccarthy 73 y.o. male is here because of elevated WBC.  He was found to have abnormal CBC from recent routine blood work. The patient is an excellent historian. He traveled to [place] for most part of June 2015; both his wife and daughter contracted pneumonia. The patient has been complaining of feeling sick since July with profound fatigue, nonproductive cough and low-grade fever. He also developed anorexia with 10 pound weight loss. Since August 2015, he complained of severe bone pain throughout, worse in his wrists, hands, neck, shoulder and with severe right hip pain. There is not reported symptoms of sinus congestion, urinary frequency/urgency or dysuria, diarrhea, or abnormal skin rash.  He had no prior history or diagnosis of cancer. His age appropriate screening programs are up-to-date. The patient has no prior diagnosis of autoimmune disease and was not prescribed corticosteroids related products. He had a chest x-ray performed recently which came back negative for pneumonia  MEDICAL HISTORY:  Past Medical History  Diagnosis Date  . Type 2 diabetes mellitus   . Hypertension   . Hyperlipidemia   . Obesity   . CAD (coronary artery disease)     mild (cath 2010)  . DOE (dyspnea on exertion)   . Herpes simplex conjunctivitis   . Arthritis   . Unspecified deficiency anemia 02/16/2014  . Weight loss 02/16/2014    SURGICAL HISTORY: Past Surgical History  Procedure Laterality Date  . Cardiac catheterization  2010    after abnormal stress test (03/2009) - mild coronary disease  . Cardiometabolic testing  19/4174    RER of 1.06, peak VO2 84% predicted; HR 87% predicted  .  Back surgery  1988  . Hernia repair    . Skull fracture surgery  1945  . Transthoracic echocardiogram  03/2009    EF 45-50%, mild conc LVH, mod septal hypokinesis, mod apical wall hypokinesis; RV systolic function borderline reduced; trace MR/TR    SOCIAL HISTORY: History   Social History  . Marital Status: Married    Spouse Name: N/A    Number of Children: 2  . Years of Education: 12   Occupational History  . retired from [employer]  . Smoking status: Former Smoker    Quit date: 04/02/1979  . Smokeless tobacco: Never Used  . Alcohol Use: 1.5 - 2.0 oz/week    3-4 drink(s) per week  . Drug Use: No  . Sexual Activity: Not on file   Other Topics Concern  . Not on file   Social History Narrative  . No narrative on file    FAMILY HISTORY: Family History  Problem Relation Age of Onset  . Hypertension Mother   . Diabetes Mother   . Heart attack Mother   . Stroke Mother   . Parkinson's disease Father   . Heart disease Father   . Diabetes Child   . Cancer Paternal Uncle     throat ca    ALLERGIES:  is allergic to bee venom and iodine.  MEDICATIONS:  Current Outpatient Prescriptions  Medication Sig Dispense Refill  . acyclovir (ZOVIRAX) 400 MG tablet Take 400 mg by mouth 2 (two) times daily.      Marland Kitchen amLODipine (NORVASC) 5 MG  tablet Take 5 mg by mouth daily.      Marland Kitchen aspirin 81 MG tablet Take 81 mg by mouth daily.      Marland Kitchen atorvastatin (LIPITOR) 40 MG tablet Take 40 mg by mouth daily.      . finasteride (PROSCAR) 5 MG tablet Take 5 mg by mouth daily.      . insulin glargine (LANTUS) 100 UNIT/ML injection Inject 20 Units into the skin at bedtime.       . insulin glulisine (APIDRA) 100 UNIT/ML injection Inject 8 Units into the skin 2 (two) times daily with a meal.       . lisinopril (PRINIVIL,ZESTRIL) 10 MG tablet Take 10 mg by mouth daily.      . metFORMIN (GLUCOPHAGE) 1000 MG tablet Take 1,000 mg by mouth 2 (two) times daily with a  meal.      . pantoprazole (PROTONIX) 40 MG tablet Take 40 mg by mouth daily.      . tamsulosin (FLOMAX) 0.4 MG CAPS capsule Take 0.4 mg by mouth daily.      . Thiamine HCl (VITAMIN B-1 PO) Take by mouth daily.       No current facility-administered medications for this visit.    REVIEW OF SYSTEMS:   Constitutional: Denies fevers, chills or abnormal night sweats Eyes: Denies blurriness of vision, double vision or watery eyes Ears, nose, mouth, throat, and face: Denies mucositis or sore throat Cardiovascular: Denies palpitation, chest discomfort or lower extremity swelling Gastrointestinal:  Denies nausea, heartburn or change in bowel habits Skin: Denies abnormal skin rashes Lymphatics: Denies new lymphadenopathy or easy bruising Neurological:Denies numbness, tingling or new weaknesses Behavioral/Psych: Mood is stable, no new changes  All other systems were reviewed with the patient and are negative.  PHYSICAL EXAMINATION: ECOG PERFORMANCE STATUS: 1 - Symptomatic but completely ambulatory  Filed Vitals:   02/16/14 1054  BP: 143/65  Pulse: 83  Temp: 98.6 F (37 C)  Resp: 20   Filed Weights   02/16/14 1054  Weight: 184 lb 3.2 oz (83.553 kg)    GENERAL:alert, no distress and comfortable SKIN: skin color, texture, turgor are normal, no rashes or significant lesions EYES: normal, conjunctiva are pink and non-injected, sclera clear OROPHARYNX:no exudate, no erythema and lips, buccal mucosa, and tongue normal  NECK: supple, thyroid normal size, non-tender, without nodularity LYMPH:  no palpable lymphadenopathy in the cervical, axillary or inguinal LUNGS: clear to auscultation and percussion with normal breathing effort HEART: regular rate & rhythm and no murmurs and no lower extremity edema ABDOMEN:abdomen soft, non-tender and normal bowel sounds Musculoskeletal:no cyanosis of digits and no clubbing  PSYCH: alert & oriented x 3 with fluent speech NEURO: no focal motor/sensory  deficits  LABORATORY DATA:  I have reviewed the data as listed Recent Results (from the past 2160 hour(s))  CBC & DIFF AND RETIC     Status: Abnormal   Collection Time    02/16/14 11:40 AM      Result Value Ref Range   WBC 11.3 (*) 4.0 - 10.3 10e3/uL   NEUT# 8.7 (*) 1.5 - 6.5 10e3/uL   HGB 11.3 (*) 13.0 - 17.1 g/dL   HCT 34.2 (*) 38.4 - 49.9 %   Platelets 421 (*) 140 - 400 10e3/uL   MCV 86.1  79.3 - 98.0 fL   MCH 28.5  27.2 - 33.4 pg   MCHC 33.0  32.0 - 36.0 g/dL   RBC 3.97 (*) 4.20 - 5.82 10e6/uL   RDW 13.9  11.0 -  14.6 %   lymph# 1.6  0.9 - 3.3 10e3/uL   MONO# 1.0 (*) 0.1 - 0.9 10e3/uL   Eosinophils Absolute 0.0  0.0 - 0.5 10e3/uL   Basophils Absolute 0.0  0.0 - 0.1 10e3/uL   NEUT% 76.9 (*) 39.0 - 75.0 %   LYMPH% 13.8 (*) 14.0 - 49.0 %   MONO% 8.8  0.0 - 14.0 %   EOS% 0.4  0.0 - 7.0 %   BASO% 0.1  0.0 - 2.0 %   Retic % 1.27  0.80 - 1.80 %   Retic Ct Abs 50.42  34.80 - 93.90 10e3/uL   Immature Retic Fract 3.30  3.00 - 10.60 %  COMPREHENSIVE METABOLIC PANEL (QI34)     Status: Abnormal   Collection Time    02/16/14 11:40 AM      Result Value Ref Range   Sodium 132 (*) 136 - 145 mEq/L   Potassium 4.6  3.5 - 5.1 mEq/L   Chloride 100  98 - 109 mEq/L   CO2 20 (*) 22 - 29 mEq/L   Glucose 146 (*) 70 - 140 mg/dl   BUN 17.9  7.0 - 26.0 mg/dL   Creatinine 0.9  0.7 - 1.3 mg/dL   Total Bilirubin 0.38  0.20 - 1.20 mg/dL   Alkaline Phosphatase 63  40 - 150 U/L   AST 15  5 - 34 U/L   ALT 12  0 - 55 U/L   Total Protein 7.2  6.4 - 8.3 g/dL   Albumin 3.5  3.5 - 5.0 g/dL   Calcium 9.2  8.4 - 10.4 mg/dL   Anion Gap 12 (*) 3 - 11 mEq/L  LACTATE DEHYDROGENASE (CC13)     Status: None   Collection Time    02/16/14 11:40 AM      Result Value Ref Range   LDH 158  125 - 245 U/L  [facility] SMEAR     Status: None   Collection Time    02/16/14 11:40 AM      Result Value Ref Range   Smear Result Smear Available    IRON AND TIBC [facility]     Status: Abnormal   Collection Time    02/16/14 11:40  AM      Result Value Ref Range   Iron 34 (*) 42 - 163 ug/dL   TIBC 291  202 - 409 ug/dL   UIBC 257  117 - 376 ug/dL   %SAT 12 (*) 20 - 55 %  FERRITIN [facility]     Status: None   Collection Time    02/16/14 11:40 AM      Result Value Ref Range   Ferritin 109  22 - 316 ng/ml  RHEUMATOID FACTOR     Status: None   Collection Time    02/16/14 11:40 AM      Result Value Ref Range   Rheumatoid Factor <10  <=14 IU/mL   Comment:                           Interpretive Table                    Low Positive: 15 - 41 IU/mL                    High Positive:  >= 42 IU/mL  In addition to the RF result, and clinical symptoms including joint involvement, the 2010 ACR Classification  Criteria for scoring/diagnosing Rheumatoid Arthritis include the results of the following tests:  CRP (81103), ESR (15010), and CCP (APCA) (15945). www.rheumatology.org/practice/clinical/classification/ra/ra_2010.asp  TSH [facility]     Status: None   Collection Time    02/16/14 11:40 AM      Result Value Ref Range   TSH 1.824  0.320 - 4.118 m(IU)/L  T4, FREE     Status: None   Collection Time    02/16/14 11:40 AM      Result Value Ref Range   Free T4 1.07  0.80 - 1.80 ng/dL   ASSESSMENT & PLAN Leukocytosis, unspecified I suspect this is reactive in nature, likely due to some form of unknown infection. He is improving. I recommend observation only for now.  Unspecified deficiency anemia This is likely anemia of chronic disease. The patient denies recent history of bleeding such as epistaxis, hematuria or hematochezia. He is asymptomatic from the anemia. We will observe for now.  He does not require transfusion now. I will order additional workup.   Weight loss The cause is unknown. I will order an additional workup including thyroid function tests.

## 2014-02-17 LAB — T4, FREE: FREE T4: 1.07 ng/dL (ref 0.80–1.80)

## 2014-02-17 LAB — ANA: Anti Nuclear Antibody(ANA): NEGATIVE

## 2014-02-17 LAB — RHEUMATOID FACTOR: Rhuematoid fact SerPl-aCnc: <10 IU/mL (ref <=14)

## 2014-02-17 LAB — SEDIMENTATION RATE: Sed Rate: 18 mm/hr — ABNORMAL HIGH (ref 0–16)

## 2014-02-18 DIAGNOSIS — R05 Cough: Secondary | ICD-10-CM | POA: Diagnosis not present

## 2014-02-18 DIAGNOSIS — F458 Other somatoform disorders: Secondary | ICD-10-CM | POA: Diagnosis not present

## 2014-02-18 DIAGNOSIS — M5137 Other intervertebral disc degeneration, lumbosacral region: Secondary | ICD-10-CM | POA: Diagnosis not present

## 2014-02-18 DIAGNOSIS — M4712 Other spondylosis with myelopathy, cervical region: Secondary | ICD-10-CM | POA: Diagnosis not present

## 2014-02-18 DIAGNOSIS — M9981 Other biomechanical lesions of cervical region: Secondary | ICD-10-CM | POA: Diagnosis not present

## 2014-02-18 DIAGNOSIS — M999 Biomechanical lesion, unspecified: Secondary | ICD-10-CM | POA: Diagnosis not present

## 2014-02-18 DIAGNOSIS — H9190 Unspecified hearing loss, unspecified ear: Secondary | ICD-10-CM | POA: Diagnosis not present

## 2014-02-18 DIAGNOSIS — R490 Dysphonia: Secondary | ICD-10-CM | POA: Diagnosis not present

## 2014-02-18 DIAGNOSIS — S239XXA Sprain of unspecified parts of thorax, initial encounter: Secondary | ICD-10-CM | POA: Diagnosis not present

## 2014-02-18 DIAGNOSIS — J342 Deviated nasal septum: Secondary | ICD-10-CM | POA: Diagnosis not present

## 2014-02-18 DIAGNOSIS — R059 Cough, unspecified: Secondary | ICD-10-CM | POA: Diagnosis not present

## 2014-02-24 ENCOUNTER — Ambulatory Visit (HOSPITAL_BASED_OUTPATIENT_CLINIC_OR_DEPARTMENT_OTHER): Payer: Medicare Other | Admitting: Hematology and Oncology

## 2014-02-24 ENCOUNTER — Encounter: Payer: Self-pay | Admitting: Hematology and Oncology

## 2014-02-24 VITALS — BP 133/66 | HR 66 | Temp 98.5°F | Resp 18 | Ht 65.0 in | Wt 186.7 lb

## 2014-02-24 DIAGNOSIS — M25519 Pain in unspecified shoulder: Secondary | ICD-10-CM

## 2014-02-24 DIAGNOSIS — R799 Abnormal finding of blood chemistry, unspecified: Secondary | ICD-10-CM | POA: Diagnosis not present

## 2014-02-24 DIAGNOSIS — R634 Abnormal weight loss: Secondary | ICD-10-CM

## 2014-02-24 DIAGNOSIS — M25511 Pain in right shoulder: Secondary | ICD-10-CM

## 2014-02-24 DIAGNOSIS — E871 Hypo-osmolality and hyponatremia: Secondary | ICD-10-CM | POA: Diagnosis not present

## 2014-02-24 DIAGNOSIS — D75838 Other thrombocytosis: Secondary | ICD-10-CM | POA: Insufficient documentation

## 2014-02-24 DIAGNOSIS — D539 Nutritional anemia, unspecified: Secondary | ICD-10-CM

## 2014-02-24 DIAGNOSIS — D72829 Elevated white blood cell count, unspecified: Secondary | ICD-10-CM

## 2014-02-24 DIAGNOSIS — K5909 Other constipation: Secondary | ICD-10-CM

## 2014-02-24 DIAGNOSIS — R7989 Other specified abnormal findings of blood chemistry: Secondary | ICD-10-CM

## 2014-02-24 DIAGNOSIS — K5900 Constipation, unspecified: Secondary | ICD-10-CM | POA: Diagnosis not present

## 2014-02-24 HISTORY — DX: Hypo-osmolality and hyponatremia: E87.1

## 2014-02-24 HISTORY — DX: Pain in right shoulder: M25.511

## 2014-02-24 HISTORY — DX: Constipation, unspecified: K59.00

## 2014-02-24 MED ORDER — SENNOSIDES-DOCUSATE SODIUM 8.6-50 MG PO TABS: 1.0000 | ORAL_TABLET | Freq: Two times a day (BID) | ORAL | Status: DC

## 2014-02-24 MED ORDER — OXYCODONE HCL 5 MG PO TABS: 5.0000 mg | ORAL_TABLET | ORAL | Status: DC | PRN

## 2014-02-24 NOTE — Assessment & Plan Note (Signed)
Cause is unknown. He is currently on free fluid restriction. I will recheck next month. Chest x-ray was reviewed and it was normal.

## 2014-02-24 NOTE — Assessment & Plan Note (Signed)
I recommend prescription laxatives.

## 2014-02-24 NOTE — Assessment & Plan Note (Signed)
Leukocytosis is improving. I do not feel strongly he needs to be placed on antimicrobial therapy. I recommend recheck blood work in a month. If he is not better, I would refer him to infectious disease for further workup.

## 2014-02-24 NOTE — Assessment & Plan Note (Signed)
There is a component of anemia of chronic disease. I recommend observation only for now.

## 2014-02-24 NOTE — Assessment & Plan Note (Addendum)
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 Screening tests for autoimmune conditions were negative. I recommend prescription pain medicine to treat this in a short term.

## 2014-02-24 NOTE — Progress Notes (Signed)
[facility] OFFICE PROGRESS NOTE  Dustin Pel, MD SUMMARY OF HEMATOLOGIC HISTORY:  He was found to have abnormal CBC from recent routine blood work. The patient is an excellent historian. He traveled to [place] for most part of June 2015; both his wife and daughter contracted pneumonia. The patient has been complaining of feeling sick since July with profound fatigue, nonproductive cough and low-grade fever. He also developed anorexia with 10 pound weight loss. Since August 2015, he complained of severe bone pain throughout, worse in his wrists, hands, neck, shoulder and with severe right hip pain. There is not reported symptoms of sinus congestion, urinary frequency/urgency or dysuria, diarrhea, or abnormal skin rash.  He had no prior history or diagnosis of cancer. His age appropriate screening programs are up-to-date. The patient has no prior diagnosis of autoimmune disease and was not prescribed corticosteroids related products. He had a chest x-ray performed recently which came back negative for pneumonia INTERVAL HISTORY: Dustin Mccarthy 73 y.o. male returns for further followup. He complained of severe pain on his right shoulder, interfering with his sleep. He complained of fatigue. Denies recent cough. The patient denies any recent signs or symptoms of bleeding such as spontaneous epistaxis, hematuria or hematochezia.  I have reviewed the past medical history, past surgical history, social history and family history with the patient and they are unchanged from previous note.  ALLERGIES:  is allergic to bee venom and iodine.  MEDICATIONS:  Current Outpatient Prescriptions  Medication Sig Dispense Refill  . acyclovir (ZOVIRAX) 400 MG tablet Take 400 mg by mouth 2 (two) times daily.      Marland Kitchen amLODipine (NORVASC) 5 MG tablet Take 5 mg by mouth daily.      Marland Kitchen aspirin 81 MG tablet Take 81 mg by mouth daily.      Marland Kitchen atorvastatin (LIPITOR) 40 MG tablet Take 40 mg by  mouth daily.      . finasteride (PROSCAR) 5 MG tablet Take 5 mg by mouth daily.      . insulin glargine (LANTUS) 100 UNIT/ML injection Inject 20 Units into the skin at bedtime.       . insulin glulisine (APIDRA) 100 UNIT/ML injection Inject 8 Units into the skin 2 (two) times daily with a meal.       . lisinopril (PRINIVIL,ZESTRIL) 10 MG tablet Take 10 mg by mouth daily.      . metFORMIN (GLUCOPHAGE) 1000 MG tablet Take 1,000 mg by mouth 2 (two) times daily with a meal.      . pantoprazole (PROTONIX) 40 MG tablet Take 40 mg by mouth daily.      . tamsulosin (FLOMAX) 0.4 MG CAPS capsule Take 0.4 mg by mouth daily.      . Thiamine HCl (VITAMIN B-1 PO) Take by mouth daily.      Marland Kitchen oxyCODONE (OXY IR/ROXICODONE) 5 MG immediate release tablet Take 1 tablet (5 mg total) by mouth every 4 (four) hours as needed for severe pain.  60 tablet  0  . senna-docusate (SENOKOT-S) 8.6-50 MG per tablet Take 1 tablet by mouth 2 (two) times daily.  60 tablet  3   No current facility-administered medications for this visit.     REVIEW OF SYSTEMS:   Constitutional: Denies fevers, chills or night sweats Eyes: Denies blurriness of vision Ears, nose, mouth, throat, and face: Denies mucositis or sore throat Respiratory: Denies cough, dyspnea or wheezes Cardiovascular: Denies palpitation, chest discomfort or lower extremity swelling Gastrointestinal:  Denies nausea, heartburn or  change in bowel habits Skin: Denies abnormal skin rashes Lymphatics: Denies new lymphadenopathy or easy bruising Neurological:Denies numbness, tingling or new weaknesses Behavioral/Psych: Mood is stable, no new changes  All other systems were reviewed with the patient and are negative.  PHYSICAL EXAMINATION: ECOG PERFORMANCE STATUS: 1 - Symptomatic but completely ambulatory  Filed Vitals:   02/24/14 1527  BP: 133/66  Pulse: 66  Temp: 98.5 F (36.9 C)  Resp: 18   Filed Weights   02/24/14 1527  Weight: 186 lb 11.2 oz (84.687 kg)     GENERAL:alert, no distress and comfortable SKIN: skin color, texture, turgor are normal, no rashes or significant lesions EYES: normal, Conjunctiva are pink and non-injected, sclera clear Musculoskeletal:no cyanosis of digits and no clubbing  NEURO: alert & oriented x 3 with fluent speech, no focal motor/sensory deficits  LABORATORY DATA:  I have reviewed the data as listed No results found for this or any previous visit (from the past 48 hour(s)).  Lab Results  Component Value Date   WBC 11.3* 02/16/2014   HGB 11.3* 02/16/2014   HCT 34.2* 02/16/2014   MCV 86.1 02/16/2014   PLT 421* 02/16/2014    RADIOGRAPHIC STUDIES: I reviewed his outside chest x-ray. I have personally reviewed the radiological images as listed and agreed with the findings in the report. ASSESSMENT & PLAN:  Leukocytosis, unspecified Leukocytosis is improving. I do not feel strongly he needs to be placed on antimicrobial therapy. I recommend recheck blood work in a month. If he is not better, I would refer him to infectious disease for further workup.  Unspecified deficiency anemia  There is a component of anemia of chronic disease. I recommend observation only for now.  Weight loss Thyroid function test is normal. I reassured the patient.  Hyponatremia Cause is unknown. He is currently on free fluid restriction. I will recheck next month. Chest x-ray was reviewed and it was normal.  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 Screening tests for autoimmune conditions were negative. I recommend prescription pain medicine to treat this in a short term.  Constipation I recommend prescription laxatives.  Reactive thrombocytosis This is reactive in nature and it is improving.     All questions were answered. The patient knows to call the clinic with any problems, questions or concerns. No barriers to learning was detected.  I spent 25 minutes  counseling the patient face to face. The total time spent in the appointment was 30 minutes and more than 50% was on counseling.     [facility], [place], MD 02/24/2014 3:55 PM

## 2014-02-24 NOTE — Assessment & Plan Note (Signed)
This is reactive in nature and it is improving.

## 2014-02-24 NOTE — Assessment & Plan Note (Signed)
Thyroid function test is normal. I reassured the patient.

## 2014-02-25 DIAGNOSIS — M999 Biomechanical lesion, unspecified: Secondary | ICD-10-CM | POA: Diagnosis not present

## 2014-02-25 DIAGNOSIS — M4712 Other spondylosis with myelopathy, cervical region: Secondary | ICD-10-CM | POA: Diagnosis not present

## 2014-02-25 DIAGNOSIS — M9981 Other biomechanical lesions of cervical region: Secondary | ICD-10-CM | POA: Diagnosis not present

## 2014-02-25 DIAGNOSIS — S239XXA Sprain of unspecified parts of thorax, initial encounter: Secondary | ICD-10-CM | POA: Diagnosis not present

## 2014-02-25 DIAGNOSIS — M5137 Other intervertebral disc degeneration, lumbosacral region: Secondary | ICD-10-CM | POA: Diagnosis not present

## 2014-03-31 ENCOUNTER — Telehealth: Payer: Self-pay | Admitting: Hematology and Oncology

## 2014-03-31 ENCOUNTER — Other Ambulatory Visit (HOSPITAL_BASED_OUTPATIENT_CLINIC_OR_DEPARTMENT_OTHER): Payer: Medicare Other

## 2014-03-31 ENCOUNTER — Ambulatory Visit (HOSPITAL_BASED_OUTPATIENT_CLINIC_OR_DEPARTMENT_OTHER): Payer: Medicare Other | Admitting: Hematology and Oncology

## 2014-03-31 ENCOUNTER — Encounter: Payer: Self-pay | Admitting: Hematology and Oncology

## 2014-03-31 VITALS — BP 130/74 | HR 79 | Temp 98.4°F | Resp 18 | Ht 65.0 in | Wt 186.9 lb

## 2014-03-31 DIAGNOSIS — M25511 Pain in right shoulder: Secondary | ICD-10-CM | POA: Diagnosis not present

## 2014-03-31 DIAGNOSIS — D72829 Elevated white blood cell count, unspecified: Secondary | ICD-10-CM

## 2014-03-31 DIAGNOSIS — D649 Anemia, unspecified: Secondary | ICD-10-CM

## 2014-03-31 DIAGNOSIS — R7989 Other specified abnormal findings of blood chemistry: Secondary | ICD-10-CM

## 2014-03-31 DIAGNOSIS — E871 Hypo-osmolality and hyponatremia: Secondary | ICD-10-CM

## 2014-03-31 DIAGNOSIS — D539 Nutritional anemia, unspecified: Secondary | ICD-10-CM

## 2014-03-31 DIAGNOSIS — D75838 Other thrombocytosis: Secondary | ICD-10-CM

## 2014-03-31 LAB — CBC & DIFF AND RETIC
BASO%: 0.3 % (ref 0.0–2.0)
Basophils Absolute: 0 10*3/uL (ref 0.0–0.1)
EOS ABS: 0.1 10*3/uL (ref 0.0–0.5)
EOS%: 1.1 % (ref 0.0–7.0)
HCT: 35.7 % — ABNORMAL LOW (ref 38.4–49.9)
HGB: 11.7 g/dL — ABNORMAL LOW (ref 13.0–17.1)
Immature Retic Fract: 9.1 % (ref 3.00–10.60)
LYMPH%: 31.2 % (ref 14.0–49.0)
MCH: 28.3 pg (ref 27.2–33.4)
MCHC: 32.8 g/dL (ref 32.0–36.0)
MCV: 86.4 fL (ref 79.3–98.0)
MONO#: 1 10*3/uL — AB (ref 0.1–0.9)
MONO%: 8.2 % (ref 0.0–14.0)
NEUT%: 59.2 % (ref 39.0–75.0)
NEUTROS ABS: 7 10*3/uL — AB (ref 1.5–6.5)
Platelets: 419 10*3/uL — ABNORMAL HIGH (ref 140–400)
RBC: 4.13 10*6/uL — AB (ref 4.20–5.82)
RDW: 15.5 % — ABNORMAL HIGH (ref 11.0–14.6)
RETIC %: 1.1 % (ref 0.80–1.80)
Retic Ct Abs: 45.43 10*3/uL (ref 34.80–93.90)
WBC: 11.9 10*3/uL — ABNORMAL HIGH (ref 4.0–10.3)
lymph#: 3.7 10*3/uL — ABNORMAL HIGH (ref 0.9–3.3)

## 2014-03-31 LAB — BASIC METABOLIC PANEL (CC13)
ANION GAP: 10 meq/L (ref 3–11)
BUN: 17.2 mg/dL (ref 7.0–26.0)
CALCIUM: 9.5 mg/dL (ref 8.4–10.4)
CO2: 23 mEq/L (ref 22–29)
CREATININE: 1 mg/dL (ref 0.7–1.3)
Chloride: 101 mEq/L (ref 98–109)
GLUCOSE: 167 mg/dL — AB (ref 70–140)
Potassium: 4.6 mEq/L (ref 3.5–5.1)
Sodium: 134 mEq/L — ABNORMAL LOW (ref 136–145)

## 2014-03-31 LAB — MORPHOLOGY: PLT EST: INCREASED

## 2014-03-31 LAB — CHCC SMEAR

## 2014-03-31 NOTE — Assessment & Plan Note (Signed)
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 Screening tests for autoimmune conditions were negative. I recommend prescription pain medicine to treat this in a short term. However, the patient has intolerance to oxycodone. I recommend rheumatology referral and he agreed to proceed.

## 2014-03-31 NOTE — Assessment & Plan Note (Signed)
This is reactive in nature and it is stable. I would not recommend bone marrow biopsy. Recommend infectious disease workup.

## 2014-03-31 NOTE — Progress Notes (Signed)
[facility] OFFICE PROGRESS NOTE  Dustin Pel, MD SUMMARY OF HEMATOLOGIC HISTORY: This patient was referred here because he was found to have abnormal CBC from recent routine blood work. The patient is an excellent historian. He traveled to [place] for most part of June 2015; both his wife and daughter contracted pneumonia. The patient has been complaining of feeling sick since July with profound fatigue, nonproductive cough and low-grade fever. He also developed anorexia with 10 pound weight loss. Since August 2015, he complained of severe bone pain throughout, worse in his wrists, hands, neck, shoulder and with severe right hip pain. There is not reported symptoms of sinus congestion, urinary frequency/urgency or dysuria, diarrhea, or abnormal skin rash.  The patient has no prior diagnosis of autoimmune disease and was not prescribed corticosteroids related products. He had a chest x-ray performed recently which came back negative for pneumonia  INTERVAL HISTORY: Dustin Mccarthy 73 y.o. male returns for futher follow-up. He is not feeling better. 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 Previously I give him prescription oxycodone and he only took 1 tablet. He developed some nausea and declined to take further pain medicine.  I have reviewed the past medical history, past surgical history, social history and family history with the patient and they are unchanged from previous note.  ALLERGIES:  is allergic to bee venom; iodine; and oxycodone.  MEDICATIONS:  Current Outpatient Prescriptions  Medication Sig Dispense Refill  . acyclovir (ZOVIRAX) 400 MG tablet Take 400 mg by mouth 2 (two) times daily.    Marland Kitchen amLODipine (NORVASC) 5 MG tablet Take 5 mg by mouth daily.    Marland Kitchen aspirin 81 MG tablet Take 81 mg by mouth daily.    Marland Kitchen atorvastatin (LIPITOR) 40 MG tablet Take 40 mg by mouth daily.    . finasteride (PROSCAR) 5 MG tablet Take 5 mg by mouth daily.     . insulin glargine (LANTUS) 100 UNIT/ML injection Inject 20 Units into the skin at bedtime.     . insulin glulisine (APIDRA) 100 UNIT/ML injection Inject 8 Units into the skin 2 (two) times daily with a meal.     . lisinopril (PRINIVIL,ZESTRIL) 10 MG tablet Take 10 mg by mouth daily.    . metFORMIN (GLUCOPHAGE) 1000 MG tablet Take 1,000 mg by mouth 2 (two) times daily with a meal.    . pantoprazole (PROTONIX) 40 MG tablet Take 40 mg by mouth daily.    Marland Kitchen senna-docusate (SENOKOT-S) 8.6-50 MG per tablet Take 1 tablet by mouth 2 (two) times daily. 60 tablet 3  . tamsulosin (FLOMAX) 0.4 MG CAPS capsule Take 0.4 mg by mouth daily.    . Thiamine HCl (VITAMIN B-1 PO) Take by mouth daily.     No current facility-administered medications for this visit.     REVIEW OF SYSTEMS:   Constitutional: Denies fevers, chills. He has occasional night sweats Eyes: Denies blurriness of vision Ears, nose, mouth, throat, and face: Denies mucositis or sore throat Respiratory: Denies cough, dyspnea or wheezes Cardiovascular: Denies palpitation, chest discomfort or lower extremity swelling Gastrointestinal:  Denies nausea, heartburn or change in bowel habits Skin: Denies abnormal skin rashes Lymphatics: Denies new lymphadenopathy or easy bruising Neurological:Denies numbness, tingling or new weaknesses Behavioral/Psych: Mood is stable, no new changes  All other systems were reviewed with the patient and are negative.  PHYSICAL EXAMINATION: ECOG PERFORMANCE STATUS: 1 - Symptomatic but completely ambulatory  Filed Vitals:   03/31/14 1441  BP: 130/74  Pulse: 79  Temp: 98.4 F (36.9 C)  Resp: 18   Filed Weights   03/31/14 1441  Weight: 186 lb 14.4 oz (84.777 kg)    GENERAL:alert, no distress and comfortable SKIN: skin color, texture, turgor are normal, no rashes or significant lesions EYES: normal, Conjunctiva are pink and non-injected, sclera clear Musculoskeletal:no cyanosis of digits and no clubbing   NEURO: alert & oriented x 3 with fluent speech, no focal motor/sensory deficits  LABORATORY DATA:  I have reviewed the data as listed Results for orders placed or performed in visit on 03/31/14 (from the past 48 hour(s))  CBC & Diff and Retic     Status: Abnormal   Collection Time: 03/31/14  2:32 PM  Result Value Ref Range   WBC 11.9 (H) 4.0 - 10.3 10e3/uL   NEUT# 7.0 (H) 1.5 - 6.5 10e3/uL   HGB 11.7 (L) 13.0 - 17.1 g/dL   HCT 35.7 (L) 38.4 - 49.9 %   Platelets 419 (H) 140 - 400 10e3/uL   MCV 86.4 79.3 - 98.0 fL   MCH 28.3 27.2 - 33.4 pg   MCHC 32.8 32.0 - 36.0 g/dL   RBC 4.13 (L) 4.20 - 5.82 10e6/uL   RDW 15.5 (H) 11.0 - 14.6 %   lymph# 3.7 (H) 0.9 - 3.3 10e3/uL   MONO# 1.0 (H) 0.1 - 0.9 10e3/uL   Eosinophils Absolute 0.1 0.0 - 0.5 10e3/uL   Basophils Absolute 0.0 0.0 - 0.1 10e3/uL   NEUT% 59.2 39.0 - 75.0 %   LYMPH% 31.2 14.0 - 49.0 %   MONO% 8.2 0.0 - 14.0 %   EOS% 1.1 0.0 - 7.0 %   BASO% 0.3 0.0 - 2.0 %   Retic % 1.10 0.80 - 1.80 %   Retic Ct Abs 45.43 34.80 - 93.90 10e3/uL   Immature Retic Fract 9.10 3.00 - 10.60 %  Smear     Status: None   Collection Time: 03/31/14  2:32 PM  Result Value Ref Range   Smear Result Smear Available   Morphology     Status: None   Collection Time: 03/31/14  2:32 PM  Result Value Ref Range   Ovalocytes Few Negative   Burr Cells Few Negative   Helmet Cell Few Negative   White Cell Comments C/W auto diff    PLT EST Increased Adequate  Basic metabolic panel     Status: Abnormal   Collection Time: 03/31/14  2:32 PM  Result Value Ref Range   Sodium 134 (L) 136 - 145 mEq/L   Potassium 4.6 3.5 - 5.1 mEq/L   Chloride 101 98 - 109 mEq/L   CO2 23 22 - 29 mEq/L   Glucose 167 (H) 70 - 140 mg/dl   BUN 17.2 7.0 - 26.0 mg/dL   Creatinine 1.0 0.7 - 1.3 mg/dL   Calcium 9.5 8.4 - 10.4 mg/dL   Anion Gap 10 3 - 11 mEq/L    Lab Results  Component Value Date   WBC 11.9* 03/31/2014   HGB 11.7* 03/31/2014   HCT 35.7* 03/31/2014   MCV 86.4  03/31/2014   PLT 419* 03/31/2014    ASSESSMENT & PLAN:  Leukocytosis Leukocytosis is stable. Overall, his clinical picture is consistent with reactive leukocytosis. I would not recommend bone marrow biopsy. I do not feel strongly he needs to be placed on antimicrobial therapy. I recommend referral to infectious disease for further workup.    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 I would  not recommend bone marrow biopsy. Recommend infectious disease workup.   Deficiency anemia There is a component of anemia of chronic disease. I recommend observation only for now. We complain of fatigue. With current level of hemoglobin, there is no indication to treat him with erythropoietin stimulating agent.    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 Screening tests for autoimmune conditions were negative. I recommend prescription pain medicine to treat this in a short term. However, the patient has intolerance to oxycodone. I recommend rheumatology referral and he agreed to proceed.     All questions were answered. The patient knows to call the clinic with any problems, questions or concerns. No barriers to learning was detected.  I spent 25 minutes counseling the patient face to face. The total time spent in the appointment was 30 minutes and more than 50% was on counseling.     [facility], [address], MD 03/31/2014 4:49 PM

## 2014-03-31 NOTE — Telephone Encounter (Signed)
Gave avs & cal for May 2016. ID will call PT with appt d/t. LVM w/ Dr. Yehuda Mao office to set up referral for Rheumatology @ 3:37pm 03/31/14.

## 2014-03-31 NOTE — Assessment & Plan Note (Signed)
There is a component of anemia of chronic disease. I recommend observation only for now. We complain of fatigue. With current level of hemoglobin, there is no indication to treat him with erythropoietin stimulating agent.

## 2014-03-31 NOTE — Assessment & Plan Note (Signed)
Leukocytosis is stable. Overall, his clinical picture is consistent with reactive leukocytosis. I would not recommend bone marrow biopsy. I do not feel strongly he needs to be placed on antimicrobial therapy. I recommend referral to infectious disease for further workup.

## 2014-04-01 DIAGNOSIS — Z23 Encounter for immunization: Secondary | ICD-10-CM | POA: Diagnosis not present

## 2014-04-01 LAB — SEDIMENTATION RATE: SED RATE: 14 mm/h (ref 0–16)

## 2014-04-03 DIAGNOSIS — H1789 Other corneal scars and opacities: Secondary | ICD-10-CM | POA: Diagnosis not present

## 2014-04-03 DIAGNOSIS — H2513 Age-related nuclear cataract, bilateral: Secondary | ICD-10-CM | POA: Diagnosis not present

## 2014-04-03 DIAGNOSIS — H01001 Unspecified blepharitis right upper eyelid: Secondary | ICD-10-CM | POA: Diagnosis not present

## 2014-04-03 DIAGNOSIS — E11331 Type 2 diabetes mellitus with moderate nonproliferative diabetic retinopathy with macular edema: Secondary | ICD-10-CM | POA: Diagnosis not present

## 2014-04-07 ENCOUNTER — Ambulatory Visit (INDEPENDENT_AMBULATORY_CARE_PROVIDER_SITE_OTHER): Payer: Medicare Other | Admitting: Internal Medicine

## 2014-04-07 ENCOUNTER — Telehealth: Payer: Self-pay | Admitting: Hematology and Oncology

## 2014-04-07 ENCOUNTER — Encounter: Payer: Self-pay | Admitting: Internal Medicine

## 2014-04-07 VITALS — BP 112/60 | HR 63 | Ht 67.0 in | Wt 186.5 lb

## 2014-04-07 DIAGNOSIS — I2583 Coronary atherosclerosis due to lipid rich plaque: Principal | ICD-10-CM

## 2014-04-07 DIAGNOSIS — E119 Type 2 diabetes mellitus without complications: Secondary | ICD-10-CM | POA: Diagnosis not present

## 2014-04-07 DIAGNOSIS — I10 Essential (primary) hypertension: Secondary | ICD-10-CM

## 2014-04-07 DIAGNOSIS — I2510 Atherosclerotic heart disease of native coronary artery without angina pectoris: Secondary | ICD-10-CM

## 2014-04-07 DIAGNOSIS — E785 Hyperlipidemia, unspecified: Secondary | ICD-10-CM

## 2014-04-07 DIAGNOSIS — I6529 Occlusion and stenosis of unspecified carotid artery: Secondary | ICD-10-CM | POA: Diagnosis not present

## 2014-04-07 NOTE — Progress Notes (Signed)
OFFICE NOTE  Chief Complaint:  Routine follow-up  Primary Care Physician: Horatio Pel, MD  HPI:  Dustin Mccarthy  Is a 73 year old gentleman who has diabetes type 2, hypertension, dyslipidemia, and obesity. He is on insulin therapy and had a heart catheterization in 2010, which was negative, because of an abnormal stress test. He has also recently had some worsening shortness of breath and difficulty with weight loss. I recommended a metabolic test to further evaluate his shortness of breath. That was performed on March 12, 2012. It showed an RER of 1.06, a peak VO2 of 84% predicted. Heart rate was 87% predicted and the heart rate and VO2 curve showed a good agreement until anaerobic threshold with some flattening of his VO2 curve suggestive of an ischemic response. The VO2, however, is high enough and greater than 60, typically a cutoff for underlying coronary disease. This is a low-risk study and suggests small vessel ischemia. I recommended aerobic exercise and adding l-arginine 3 g p.o. t.i.d. to the diet. He actually did both of these things and reported some marked improvement in his shortness of breath. He did gardening for about 6 months and discontinued it. His exercise is ongoing he is managed to lose 6-8 pounds. He says he feels better and is not bothered by shortness of breath. He been followed closely by Sandi Mariscal (PharmD) at Dr. Peterson [place] office. He reports that his diabetes is fairly well controlled with an A1c of 6.9. He is cholesterol is also at goal.  Dustin Mccarthy returns today for follow-up. Recently he's been having some issues after he returned from a long visit in [place]. He has been noted to have high platelets, and elevated white blood cell count and anemia. He's been seen by hematology and they are fairly convinced he does not have a hematologic malignancy. He is also being seen by infectious diseases for possible exposure related abnormalities in his blood work.  He denies any chest pain or worsening shortness of breath but does have significant fatigue. He denies any fevers, chills or constitutional symptoms. He did have a lipid profile in September which showed total cholesterol of 96, triglycerides 57, HDL 35 and LDL of 50.  PMHx:  Past Medical History  Diagnosis Date  . Type 2 diabetes mellitus   . Hypertension   . Hyperlipidemia   . Obesity   . CAD (coronary artery disease)     mild (cath 2010)  . DOE (dyspnea on exertion)   . Herpes simplex conjunctivitis   . Arthritis   . Unspecified deficiency anemia 02/16/2014  . Weight loss 02/16/2014  . Right shoulder pain 02/24/2014  . Constipation 02/24/2014  . Hyponatremia 02/24/2014    Past Surgical History  Procedure Laterality Date  . Cardiac catheterization  2010    after abnormal stress test (03/2009) - mild coronary disease  . Cardiometabolic testing  87/6811    RER of 1.06, peak VO2 84% predicted; HR 87% predicted  . Back surgery  1988  . Hernia repair    . Skull fracture surgery  1945  . Transthoracic echocardiogram  03/2009    EF 45-50%, mild conc LVH, mod septal hypokinesis, mod apical wall hypokinesis; RV systolic function borderline reduced; trace MR/TR    FAMHx:  Family History  Problem Relation Age of Onset  . Hypertension Mother   . Diabetes Mother   . Heart attack Mother   . Stroke Mother   . Parkinson's disease Father   . Heart disease Father   .  Diabetes Child   . Cancer Paternal Uncle     throat ca    SOCHx:   reports that he quit smoking about 35 years ago. He has never used smokeless tobacco. He reports that he drinks about 1.5 - 2.0 oz of alcohol per week. He reports that he does not use illicit drugs.  ALLERGIES:  Allergies  Allergen Reactions  . Bee Venom   . Iodine   . Oxycodone Nausea And Vomiting    Cold sweats, n/v and "out of it."    ROS: A comprehensive review of systems was negative except for: Constitutional: positive for  fatigue Hematologic/lymphatic: positive for hematologic abnormalities  HOME MEDS: Current Outpatient Prescriptions  Medication Sig Dispense Refill  . acyclovir (ZOVIRAX) 400 MG tablet Take 400 mg by mouth 2 (two) times daily.    Marland Kitchen amLODipine (NORVASC) 5 MG tablet Take 5 mg by mouth daily.    Marland Kitchen aspirin 81 MG tablet Take 81 mg by mouth daily.    Marland Kitchen atorvastatin (LIPITOR) 40 MG tablet Take 40 mg by mouth daily.    . finasteride (PROSCAR) 5 MG tablet Take 5 mg by mouth daily.    . insulin glargine (LANTUS) 100 UNIT/ML injection Inject 20 Units into the skin at bedtime.     . insulin glulisine (APIDRA) 100 UNIT/ML injection Inject 8 Units into the skin 2 (two) times daily with a meal.     . lisinopril (PRINIVIL,ZESTRIL) 10 MG tablet Take 10 mg by mouth daily.    . metFORMIN (GLUCOPHAGE) 1000 MG tablet Take 1,000 mg by mouth 2 (two) times daily with a meal.    . pantoprazole (PROTONIX) 40 MG tablet Take 40 mg by mouth daily.    . tamsulosin (FLOMAX) 0.4 MG CAPS capsule Take 0.4 mg by mouth daily.    . Thiamine HCl (VITAMIN B-1 PO) Take by mouth daily.     No current facility-administered medications for this visit.    LABS/IMAGING: No results found for this or any previous visit (from the past 48 hour(s)). No results found.  VITALS: BP 112/60 mmHg  Pulse 63  Ht 5\' 7"  (1.702 m)  Wt 186 lb 8 oz (84.596 kg)  BMI 29.20 kg/m2  EXAM: General appearance: alert and no distress Neck: no carotid bruit and no JVD Lungs: clear to auscultation bilaterally Heart: regular rate and rhythm, S1, S2 normal, no murmur, click, rub or gallop Abdomen: soft, non-tender; bowel sounds normal; no masses,  no organomegaly Extremities: extremities normal, atraumatic, no cyanosis or edema Pulses: 2+ and symmetric Skin: Skin color, texture, turgor normal. No rashes or lesions Neurologic: Grossly normal Psych: Mood, affect normal  EKG: Sinus rhythm with first degree AV block at  63  ASSESSMENT: 1. Hypertension-controlled 2. Dyslipidemia on atorvastatin-at goal 3. Insulin-dependent diabetes-A1c 6.7 4. Fatigue 5. Elevated platelets, anemia, leukocytosis 6. Mild coronary artery disease by cath in 2010  PLAN: 1.   Dustin Mccarthy is feeling somewhat fatigued but perhaps slowly improving. He's been seen by a number specialists recently for abnormal CBC but has no definitive diagnosis to this point. From a cardiac standpoint blood pressures well controlled, his cholesterol is at goal on current dose of Lipitor. He denies any cardiac symptoms. A1c is improved at 6.7. I would recommend continue his current medications and he will need continued workup of his abnormal CBC.  Pixie Casino, MD, [facility] Attending Cardiologist [facility]  HILTY,Kenneth C 04/07/2014, 2:01 PM

## 2014-04-07 NOTE — Telephone Encounter (Signed)
Faxed pt medical records to Dr. Lenna Gilford

## 2014-04-07 NOTE — Patient Instructions (Signed)
Your physician wants you to follow-up in: 1 year with Dr. Hilty. You will receive a reminder letter in the mail two months in advance. If you don't receive a letter, please call our office to schedule the follow-up appointment.  

## 2014-04-14 ENCOUNTER — Telehealth: Payer: Self-pay | Admitting: *Deleted

## 2014-04-14 NOTE — Telephone Encounter (Signed)
Received call from [place] at [facility] ph [phone], ext. [phone].  She asks about reason for referral to Rheumatology.  I called back and left VM informing that it is for right shoulder pain according to MD notes. Please call us back if any other questions or problems w/ referral.

## 2014-04-22 ENCOUNTER — Ambulatory Visit (INDEPENDENT_AMBULATORY_CARE_PROVIDER_SITE_OTHER): Payer: Medicare Other | Admitting: Infectious Disease

## 2014-04-22 ENCOUNTER — Encounter: Payer: Self-pay | Admitting: Infectious Disease

## 2014-04-22 ENCOUNTER — Other Ambulatory Visit: Payer: Self-pay | Admitting: Infectious Disease

## 2014-04-22 VITALS — BP 145/79 | HR 66 | Temp 98.4°F | Wt 187.0 lb

## 2014-04-22 DIAGNOSIS — E871 Hypo-osmolality and hyponatremia: Secondary | ICD-10-CM | POA: Diagnosis not present

## 2014-04-22 DIAGNOSIS — R1011 Right upper quadrant pain: Secondary | ICD-10-CM | POA: Diagnosis not present

## 2014-04-22 DIAGNOSIS — R7989 Other specified abnormal findings of blood chemistry: Secondary | ICD-10-CM | POA: Diagnosis not present

## 2014-04-22 DIAGNOSIS — M25519 Pain in unspecified shoulder: Secondary | ICD-10-CM | POA: Diagnosis not present

## 2014-04-22 DIAGNOSIS — I6529 Occlusion and stenosis of unspecified carotid artery: Secondary | ICD-10-CM

## 2014-04-22 DIAGNOSIS — R634 Abnormal weight loss: Secondary | ICD-10-CM

## 2014-04-22 DIAGNOSIS — D72829 Elevated white blood cell count, unspecified: Secondary | ICD-10-CM | POA: Diagnosis not present

## 2014-04-22 DIAGNOSIS — R799 Abnormal finding of blood chemistry, unspecified: Secondary | ICD-10-CM | POA: Diagnosis not present

## 2014-04-22 DIAGNOSIS — D75838 Other thrombocytosis: Secondary | ICD-10-CM

## 2014-04-22 DIAGNOSIS — R509 Fever, unspecified: Secondary | ICD-10-CM

## 2014-04-22 DIAGNOSIS — M25511 Pain in right shoulder: Secondary | ICD-10-CM | POA: Diagnosis not present

## 2014-04-22 DIAGNOSIS — R945 Abnormal results of liver function studies: Secondary | ICD-10-CM

## 2014-04-22 NOTE — Progress Notes (Signed)
Subjective:    Patient ID: Dustin Mccarthy, male    DOB: 09/30/1940, 73 y.o.   MRN: [id number]  HPI  73 year old man with PMHX significant for CAD, DM who began to feel poorly in June in 2015. He and his wife were [place] in [place]. Wife and daughter were already suffering from lung symptoms after having been diagnosed with Pneumonia and were worked up for [employer] Fever." The patient started feeling bad, malaise, weak, exhausted, poor appetite. Does not recall a fever. Minimal cough.   In August made mis-step and fell and saw a Restaurant manager, fast food. Xrays done by Dr Shelia Media of [facility], shoulders. Post chiropractor manipulation has had severe shoulder pain.   On routine blood work in September found to have low sodium along with high wbc and high platelets. He was referred to Dr. Simeon Craft such with hematology oncology has repeated blood work. The patient has continue to have slightly elevated white blood cell count of 11,000 311,900. The predominant cell type is neutrophils with an absolute neutrophil count of 8700 on first blood work done at count and 7000 on subsequent blood work. Is also mildly anemic with a hemoglobin of 11.3-11.7 sedimentation rate has ranged pending 14 and 18 in the normal range. Chemistry panel was pertinent for mild hyponatremia but otherwise normal labs.  He reportedly had a chest x-ray with his primary care physician which was normal. Other not his other recent imaging includes an ultrasound of his carotid Dopplers as well as imaging of his spine done by the chiropractor.  We are consulted to workup the patient's elevated white blood cell count and platelet count which were thought to be reactive in nature. He does not have a measurable fever but we are treating this much as a fever of unknown origin for diagnostic purposes it is essential a leukocytosis of unknown origin.  He is a [place] who spent most of his time was [place] but also traveled to [employer] and his  travel to [place] including [place] he has never tested positive for tuberculosis to his knowledge   Review of Systems  Constitutional: Positive for activity change, appetite change, fatigue and unexpected weight change. Negative for fever, chills and diaphoresis.  HENT: Negative for congestion, rhinorrhea, sinus pressure, sneezing, sore throat and trouble swallowing.   Eyes: Negative for photophobia and visual disturbance.  Respiratory: Negative for cough, chest tightness, shortness of breath, wheezing and stridor.   Cardiovascular: Negative for chest pain, palpitations and leg swelling.  Gastrointestinal: Negative for nausea, vomiting, abdominal pain, diarrhea, constipation, blood in stool and abdominal distention.  Genitourinary: Negative for dysuria, hematuria, flank pain and difficulty urinating.  Musculoskeletal: Positive for arthralgias. Negative for myalgias, back pain, joint swelling and gait problem.  Skin: Negative for color change, pallor, rash and wound.  Neurological: Negative for dizziness, tremors, weakness and light-headedness.  Hematological: Negative for adenopathy. Does not bruise/bleed easily.  Psychiatric/Behavioral: Negative for behavioral problems, confusion, sleep disturbance, dysphoric mood, decreased concentration and agitation.       Objective:   Physical Exam  Constitutional: He is oriented to person, place, and time. He appears well-developed and well-nourished. No distress.  HENT:  Head: Normocephalic and atraumatic.  Mouth/Throat: Oropharynx is clear and moist. No oropharyngeal exudate.  Eyes: Conjunctivae and EOM are normal. Pupils are equal, round, and reactive to light. No scleral icterus.  Neck: Normal range of motion. Neck supple. No JVD present.  Cardiovascular: Normal rate, regular rhythm and normal heart  sounds.  Exam reveals no gallop and no friction rub.   No murmur heard. Pulmonary/Chest: Effort normal and breath sounds normal. No  respiratory distress. He has no wheezes. He has no rales. He exhibits no tenderness.  Abdominal: He exhibits no distension and no mass. There is no tenderness. There is no rebound and no guarding.  Musculoskeletal: He exhibits no edema.       Right shoulder: He exhibits decreased range of motion and tenderness.       Arms: Lymphadenopathy:    He has no cervical adenopathy.  Neurological: He is alert and oriented to person, place, and time. He exhibits normal muscle tone. Coordination normal.  Skin: Skin is warm and dry. He is not diaphoretic. No erythema. No pallor.  Psychiatric: He has a normal mood and affect. His behavior is normal. Judgment and thought content normal.  Nursing note and vitals reviewed.         Assessment & Plan:   Leukocytosis and thrombophilia of unknown origin:   I feel the shoulder may be potentially a clue and we will check an MRI with and without contrast of the right shoulder.  We will get further blood work today including an HIV test hepatitis panel Epstein-Barr virus and cytomegalovirus serologies along with blood cultures 2 sites, QFeron gold, SPEP. Serum antibodies for Coccidioides by Kandice Hams fixation an immune diffusion as well as antigen for Coccidioides in the urine and histoplasma and Blastomyces antigens in the urine.   IF the above workup is unrevealing can contemplate more aggressive workup for this condition with a CT of the chest abdomen pelvis with IV and oral contrast.  It will be very helpful to get labs on the primary care physician to see how far his current slightly abnormal white blood cell count with abnormal hemoglobin and platelets vary from his baseline labs.  I spent greater than 60 minutes with the patient including greater than 50% of time in face to face counsel of the patient and in coordination of their care.  Right shoulder pain: MRI as above

## 2014-04-23 LAB — HIV ANTIBODY (ROUTINE TESTING W REFLEX): HIV 1&2 Ab, 4th Generation: NONREACTIVE

## 2014-04-23 LAB — CK: CK TOTAL: 77 U/L (ref 7–232)

## 2014-04-23 LAB — HEPATITIS PANEL, ACUTE
HCV AB: NEGATIVE
HEP B S AG: NEGATIVE
Hep A IgM: NONREACTIVE
Hep B C IgM: NONREACTIVE

## 2014-04-23 LAB — FERRITIN: Ferritin: 52 ng/mL (ref 22–322)

## 2014-04-24 LAB — EPSTEIN-BARR VIRUS NUCLEAR ANTIGEN ANTIBODY, IGG: EBV NA IgG: 587 U/mL — ABNORMAL HIGH (ref <18.0)

## 2014-04-24 LAB — CYTOMEGALOVIRUS ANTIBODY, IGG: Cytomegalovirus Ab-IgG: 1.7 U/mL — ABNORMAL HIGH (ref <0.60)

## 2014-04-24 LAB — CMV IGM

## 2014-04-25 LAB — QUANTIFERON TB GOLD ASSAY (BLOOD)
INTERFERON GAMMA RELEASE ASSAY: NEGATIVE
Quantiferon Nil Value: 0.02 IU/mL
Quantiferon Tb Ag Minus Nil Value: 0 IU/mL
TB Ag value: 0.02 IU/mL

## 2014-04-27 LAB — PROTEIN ELECTROPHORESIS, SERUM
Albumin ELP: 56 % (ref 55.8–66.1)
Alpha-1-Globulin: 4.9 % (ref 2.9–4.9)
Alpha-2-Globulin: 12.8 % — ABNORMAL HIGH (ref 7.1–11.8)
BETA 2: 5.1 % (ref 3.2–6.5)
BETA GLOBULIN: 6.8 % (ref 4.7–7.2)
Gamma Globulin: 14.4 % (ref 11.1–18.8)
Total Protein, Serum Electrophoresis: 6.8 g/dL (ref 6.0–8.3)

## 2014-04-27 LAB — COCCIDIOIDES ANTIBODIES: Coccidioides Ab CF: <1:2 {titer}

## 2014-04-27 LAB — HISTOPLASMA ANTIGEN, URINE

## 2014-04-28 LAB — MVISTA BLASTOMYCES QNT AG, URINE

## 2014-04-28 LAB — CULTURE, BLOOD (SINGLE)
ORGANISM ID, BACTERIA: NO GROWTH
Organism ID, Bacteria: NO GROWTH

## 2014-04-30 DIAGNOSIS — E11329 Type 2 diabetes mellitus with mild nonproliferative diabetic retinopathy without macular edema: Secondary | ICD-10-CM | POA: Diagnosis not present

## 2014-04-30 DIAGNOSIS — E11321 Type 2 diabetes mellitus with mild nonproliferative diabetic retinopathy with macular edema: Secondary | ICD-10-CM | POA: Diagnosis not present

## 2014-05-06 LAB — OTHER SOLSTAS TEST

## 2014-05-08 ENCOUNTER — Other Ambulatory Visit: Payer: Self-pay | Admitting: Infectious Disease

## 2014-05-08 ENCOUNTER — Ambulatory Visit (HOSPITAL_COMMUNITY)
Admission: RE | Admit: 2014-05-08 | Discharge: 2014-05-08 | Disposition: A | Discharge status: Home / Self Care | Payer: Medicare Other | Source: Ambulatory Visit | Attending: Infectious Disease | Admitting: Infectious Disease

## 2014-05-08 DIAGNOSIS — M25511 Pain in right shoulder: Secondary | ICD-10-CM | POA: Diagnosis not present

## 2014-05-08 DIAGNOSIS — E119 Type 2 diabetes mellitus without complications: Secondary | ICD-10-CM | POA: Insufficient documentation

## 2014-05-08 DIAGNOSIS — M19011 Primary osteoarthritis, right shoulder: Secondary | ICD-10-CM | POA: Diagnosis not present

## 2014-05-08 DIAGNOSIS — X58XXXA Exposure to other specified factors, initial encounter: Secondary | ICD-10-CM | POA: Diagnosis not present

## 2014-05-08 DIAGNOSIS — M659 Synovitis and tenosynovitis, unspecified: Secondary | ICD-10-CM | POA: Insufficient documentation

## 2014-05-08 DIAGNOSIS — S46011A Strain of muscle(s) and tendon(s) of the rotator cuff of right shoulder, initial encounter: Secondary | ICD-10-CM | POA: Diagnosis not present

## 2014-05-08 DIAGNOSIS — Z77018 Contact with and (suspected) exposure to other hazardous metals: Secondary | ICD-10-CM | POA: Diagnosis not present

## 2014-05-08 DIAGNOSIS — Z9889 Other specified postprocedural states: Secondary | ICD-10-CM

## 2014-05-08 DIAGNOSIS — R531 Weakness: Secondary | ICD-10-CM | POA: Diagnosis not present

## 2014-05-08 DIAGNOSIS — M25411 Effusion, right shoulder: Secondary | ICD-10-CM | POA: Diagnosis not present

## 2014-05-08 MED ORDER — GADOBENATE DIMEGLUMINE 529 MG/ML IV SOLN
20.0000 mL | Freq: Once | INTRAVENOUS | Status: AC | PRN
  Administered 2014-05-08: 17 mL via INTRAVENOUS

## 2014-05-11 ENCOUNTER — Telehealth: Payer: Self-pay | Admitting: *Deleted

## 2014-05-11 NOTE — Telephone Encounter (Signed)
Just spoke with the patient and I will also touch base with Dr. Mardelle Matte to give him heads up on this guy as well

## 2014-05-11 NOTE — Telephone Encounter (Signed)
Pt has an appointment with Dr. Mardelle Matte 12/15 Tuesday, 3:00.  WIll notify patient after Dr. Tommy Medal speaks with him.  Office notes, labs, image results, demographics, insurance faxed to Dr. Luanna Cole office. Landis Gandy, RN

## 2014-05-12 DIAGNOSIS — M19011 Primary osteoarthritis, right shoulder: Secondary | ICD-10-CM | POA: Diagnosis not present

## 2014-05-18 ENCOUNTER — Ambulatory Visit (INDEPENDENT_AMBULATORY_CARE_PROVIDER_SITE_OTHER): Payer: Medicare Other | Admitting: Infectious Disease

## 2014-05-18 ENCOUNTER — Encounter: Payer: Self-pay | Admitting: Infectious Disease

## 2014-05-18 VITALS — BP 106/68 | HR 73 | Temp 98.1°F | Wt 182.5 lb

## 2014-05-18 DIAGNOSIS — R7989 Other specified abnormal findings of blood chemistry: Secondary | ICD-10-CM

## 2014-05-18 DIAGNOSIS — D72829 Elevated white blood cell count, unspecified: Secondary | ICD-10-CM | POA: Diagnosis not present

## 2014-05-18 DIAGNOSIS — M25411 Effusion, right shoulder: Secondary | ICD-10-CM | POA: Diagnosis not present

## 2014-05-18 DIAGNOSIS — D75838 Other thrombocytosis: Secondary | ICD-10-CM

## 2014-05-18 DIAGNOSIS — I6529 Occlusion and stenosis of unspecified carotid artery: Secondary | ICD-10-CM

## 2014-05-18 DIAGNOSIS — M25511 Pain in right shoulder: Secondary | ICD-10-CM | POA: Diagnosis not present

## 2014-05-18 LAB — CBC WITH DIFFERENTIAL/PLATELET
BASOS PCT: 0 % (ref 0–1)
Basophils Absolute: 0 10*3/uL (ref 0.0–0.1)
EOS ABS: 0.1 10*3/uL (ref 0.0–0.7)
Eosinophils Relative: 1 % (ref 0–5)
HCT: 35.5 % — ABNORMAL LOW (ref 39.0–52.0)
HEMOGLOBIN: 11.4 g/dL — AB (ref 13.0–17.0)
Lymphocytes Relative: 26 % (ref 12–46)
Lymphs Abs: 3.2 10*3/uL (ref 0.7–4.0)
MCH: 28.6 pg (ref 26.0–34.0)
MCHC: 32.1 g/dL (ref 30.0–36.0)
MCV: 89.2 fL (ref 78.0–100.0)
MPV: 9.6 fL (ref 9.4–12.4)
Monocytes Absolute: 1 10*3/uL (ref 0.1–1.0)
Monocytes Relative: 8 % (ref 3–12)
NEUTROS ABS: 7.9 10*3/uL — AB (ref 1.7–7.7)
NEUTROS PCT: 65 % (ref 43–77)
Platelets: 400 10*3/uL (ref 150–400)
RBC: 3.98 MIL/uL — ABNORMAL LOW (ref 4.22–5.81)
RDW: 15.2 % (ref 11.5–15.5)
WBC: 12.2 10*3/uL — ABNORMAL HIGH (ref 4.0–10.5)

## 2014-05-18 NOTE — Progress Notes (Signed)
Subjective:    Patient ID: Dustin Mccarthy, male    DOB: 12/25/1940, 73 y.o.   MRN: [id number]  HPI   73 year old man with PMHX significant for CAD, DM who began to feel poorly in June in 2015. He and his wife were [place] in [place]. Wife and daughter were already suffering from lung symptoms after having been diagnosed with Pneumonia and were worked up for Blue[employer]x Fever." The patient started feeling bad, malaise, weak, exhausted, poor appetite. Does not recall a fever. Minimal cough.   In August made mis-step and fell and saw a Restaurant manager, fast food. Xrays done by Dr Shelia Media of [facility], shoulders. Post chiropractor manipulation has had severe shoulder pain.   On routine blood work in September found to have low sodium along with high wbc and high platelets. He was referred to Dr. Simeon Craft such with hematology oncology has repeated blood work. The patient has continue to have slightly elevated white blood cell count of 11,000 311,900. The predominant cell type is neutrophils with an absolute neutrophil count of 8700 on first blood work done at count and 7000 on subsequent blood work. Is also mildly anemic with a hemoglobin of 11.3-11.7 sedimentation rate has ranged pending 14 and 18 in the normal range. Chemistry panel was pertinent for mild hyponatremia but otherwise normal labs.  He reportedly had a chest x-ray with his primary care physician which was normal. Other not his other recent imaging includes an ultrasound of his carotid Dopplers as well as imaging of his spine done by the chiropractor.  We are consulted to workup the patient's elevated white blood cell count and platelet count which were thought to be reactive in nature. He does not have a measurable fever but we are treating this much as a fever of unknown origin for diagnostic purposes it is essential a leukocytosis of unknown origin.  He is a [place] who spent most of his time was [place] but also traveled to [employer] [place] and  his travel to [place] including [place] he has never tested positive for tuberculosis to his knowledge  We saw him in clinic in late November and tested him for HIV, viral hepatides, QF gold, histoplasma ag, Coccidioides antibodies by IF and CF and these were all negative. SPEP negative. CK negative.  We obtained MRI of the shoulder which showed:  IMPRESSION: 1. Moderate size complex joint effusion with diffuse synovial enhancement consistent with nonspecific synovitis. Enhancement is also demonstrated within the subacromial -subdeltoid bursa and the acromioclavicular joint. No specific signs of infection are identified, although joint aspiration should be considered if that is a clinical concern. 2. Diffuse rotator cuff and bicipital tendinosis with minimal intrasubstance insertional tearing of the infraspinous tendon. No full-thickness tendon tear. 3. Moderately advanced glenohumeral degenerative changes with diffuse labral tearing.   I arranged for him to see Dr. Mardelle Matte who saw him on 05/12/14. Dr. 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 He attempted aspiration but could not retrieve sufficient fluid for studies. He then performed injection with marcaine and depomedrol to see if this would improve pts pain. He gave rx for mobic but pt never began taking it as he and his wife were terrified of warnings that came with it at pharmacy.  Patient himself continues to have low energy and malaise but is not having weight loss, or fevers, night sweats or other things I would typically expect with acute infection.  I am  endeavoring to obtain his chronic labs from Dr. Pennie Banter office. So far I can find WBC 12.1 in August 26th, 2015, Plateletes 568 at that time. Followup labs in September 4th, 10th, showed WBC of 12.3 then 13.8 and platelets of 523 and 537. Patient had monocyte predominance of WBC. I do not yet have older labs.     His shoulder pain initially improved after injection but now with some pain again.   Review of Systems  Constitutional: Positive for activity change, appetite change, fatigue and unexpected weight change. Negative for fever, chills and diaphoresis.  HENT: Negative for congestion, rhinorrhea, sinus pressure, sneezing, sore throat and trouble swallowing.   Eyes: Negative for photophobia and visual disturbance.  Respiratory: Negative for cough, chest tightness, shortness of breath, wheezing and stridor.   Cardiovascular: Negative for chest pain, palpitations and leg swelling.  Gastrointestinal: Negative for nausea, vomiting, abdominal pain, diarrhea, constipation, blood in stool and abdominal distention.  Genitourinary: Negative for dysuria, hematuria, flank pain and difficulty urinating.  Musculoskeletal: Positive for arthralgias. Negative for myalgias, back pain, joint swelling and gait problem.  Skin: Negative for color change, pallor, rash and wound.  Neurological: Negative for dizziness, tremors, weakness and light-headedness.  Hematological: Negative for adenopathy. Does not bruise/bleed easily.  Psychiatric/Behavioral: Negative for behavioral problems, confusion, sleep disturbance, dysphoric mood, decreased concentration and agitation.       Objective:   Physical Exam  Constitutional: He is oriented to person, place, and time. He appears well-developed and well-nourished. No distress.  HENT:  Head: Normocephalic and atraumatic.  Mouth/Throat: Oropharynx is clear and moist. No oropharyngeal exudate.  Eyes: Conjunctivae and EOM are normal. Pupils are equal, round, and reactive to light. No scleral icterus.  Neck: Normal range of motion. Neck supple. No JVD present.  Cardiovascular: Normal rate, regular rhythm and normal heart sounds.  Exam reveals no gallop and no friction rub.   No murmur heard. Pulmonary/Chest: Effort normal and breath sounds normal. No respiratory distress. He  has no wheezes. He has no rales. He exhibits no tenderness.  Abdominal: He exhibits no distension and no mass. There is no tenderness. There is no rebound and no guarding.  Musculoskeletal: He exhibits no edema.       Right shoulder: He exhibits decreased range of motion and tenderness.       Arms: Lymphadenopathy:    He has no cervical adenopathy.  Neurological: He is alert and oriented to person, place, and time. He exhibits normal muscle tone. Coordination normal.  Skin: Skin is warm and dry. Rash noted. He is not diaphoretic. No erythema. No pallor.  Psychiatric: He has a normal mood and affect. His behavior is normal. Judgment and thought content normal.  Nursing note and vitals reviewed.         Assessment & Plan:   Leukocytosis and thrombophilia of unknown origin:  At present plan is to get old labs from Dr Shelia Media. Perhaps this patient has baseline leukocytosis and elevated WBC but not to the level that he had this fall. Will recheck labs today keeping in mind the steroids in his shoulder could effect them  Will have him followup with Dr. Mardelle Matte in mid January and then with me. At that time will reconsider ? Have IR aspirate the shoulder for cell count and diff, GS and culture, crystals, fungal cx, afb culture  I spent greater than 25 minutes with the patient including greater than 50% of time in face to face counsel of the patient and in coordination  of their care.

## 2014-05-21 ENCOUNTER — Encounter: Payer: Self-pay | Admitting: Infectious Disease

## 2014-06-09 DIAGNOSIS — M19011 Primary osteoarthritis, right shoulder: Secondary | ICD-10-CM | POA: Diagnosis not present

## 2014-06-09 DIAGNOSIS — I10 Essential (primary) hypertension: Secondary | ICD-10-CM | POA: Diagnosis not present

## 2014-06-09 DIAGNOSIS — E780 Pure hypercholesterolemia: Secondary | ICD-10-CM | POA: Diagnosis not present

## 2014-06-09 DIAGNOSIS — E118 Type 2 diabetes mellitus with unspecified complications: Secondary | ICD-10-CM | POA: Diagnosis not present

## 2014-06-11 DIAGNOSIS — E119 Type 2 diabetes mellitus without complications: Secondary | ICD-10-CM | POA: Diagnosis not present

## 2014-06-11 DIAGNOSIS — I10 Essential (primary) hypertension: Secondary | ICD-10-CM | POA: Diagnosis not present

## 2014-06-11 DIAGNOSIS — E785 Hyperlipidemia, unspecified: Secondary | ICD-10-CM | POA: Diagnosis not present

## 2014-06-16 ENCOUNTER — Encounter: Payer: Self-pay | Admitting: Infectious Disease

## 2014-06-16 ENCOUNTER — Ambulatory Visit (INDEPENDENT_AMBULATORY_CARE_PROVIDER_SITE_OTHER): Payer: Medicare Other | Admitting: Infectious Disease

## 2014-06-16 VITALS — BP 148/80 | HR 65 | Temp 98.0°F | Wt 185.0 lb

## 2014-06-16 DIAGNOSIS — D72829 Elevated white blood cell count, unspecified: Secondary | ICD-10-CM | POA: Diagnosis not present

## 2014-06-16 DIAGNOSIS — M25511 Pain in right shoulder: Secondary | ICD-10-CM

## 2014-06-16 DIAGNOSIS — D6859 Other primary thrombophilia: Secondary | ICD-10-CM | POA: Diagnosis not present

## 2014-06-16 NOTE — Progress Notes (Signed)
Subjective:    Patient ID: Dustin Mccarthy, male    DOB: Feb 12, 1941, 74 y.o.   MRN: [id number]  HPI   74 year old man with PMHX significant for CAD, DM who began to feel poorly in June in 2015. He and his wife were [place] in [place]. Wife and daughter were already suffering from lung symptoms after having been diagnosed with Pneumonia and were worked up for Blue[employer]x Fever." The patient started feeling bad, malaise, weak, exhausted, poor appetite. Does not recall a fever. Minimal cough.   In August made mis-step and fell and saw a Restaurant manager, fast food. Xrays done by Dr Shelia Media of [facility], shoulders. Post chiropractor manipulation has had severe shoulder pain.   On routine blood work in September found to have low sodium along with high wbc and high platelets. He was referred to Dr. Simeon Craft such with hematology oncology has repeated blood work. The patient has continue to have slightly elevated white blood cell count of 11,000 311,900. The predominant cell type is neutrophils with an absolute neutrophil count of 8700 on first blood work done at count and 7000 on subsequent blood work. Is also mildly anemic with a hemoglobin of 11.3-11.7 sedimentation rate has ranged pending 14 and 18 in the normal range. Chemistry panel was pertinent for mild hyponatremia but otherwise normal labs.  He reportedly had a chest x-ray with his primary care physician which was normal. Other not his other recent imaging includes an ultrasound of his carotid Dopplers as well as imaging of his spine done by the chiropractor.  We are consulted to workup the patient's elevated white blood cell count and platelet count which were thought to be reactive in nature. He does not have a measurable fever but we are treating this much as a fever of unknown origin for diagnostic purposes it is essential a leukocytosis of unknown origin.  He is a [place] who spent most of his time was [place] but also traveled to [place] and  his travel to [place] including [place] he has never tested positive for tuberculosis to his knowledge  We saw him in clinic in late November and tested him for HIV, viral hepatides, QF gold, histoplasma ag, Coccidioides antibodies by IF and CF and these were all negative. SPEP negative. CK negative.  We obtained MRI of the shoulder which showed:  IMPRESSION: 1. Moderate size complex joint effusion with diffuse synovial enhancement consistent with nonspecific synovitis. Enhancement is also demonstrated within the subacromial -subdeltoid bursa and the acromioclavicular joint. No specific signs of infection are identified, although joint aspiration should be considered if that is a clinical concern. 2. Diffuse rotator cuff and bicipital tendinosis with minimal intrasubstance insertional tearing of the infraspinous tendon. No full-thickness tendon tear. 3. Moderately advanced glenohumeral degenerative changes with diffuse labral tearing.   I arranged for him to see Dr. Mardelle Matte who saw him on 05/12/14. Dr. 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 He attempted aspiration but could not retrieve sufficient fluid for studies. He then performed injection with marcaine and depomedrol to see if this would improve pts pain. He gave rx for mobic but pt never began taking it as he and his wife were terrified of warnings that came with it at pharmacy.  Patient himself AT THAT TIME continued to have low energy and malaise but is not having weight loss, or fevers, night sweats or other things I would typically expect with acute infection.  I am endeavoring to obtain his chronic labs from Dr. Pennie Banter office. So far I can find WBC 12.1 in August 26th, 2015, Plateletes 568 at that time. Followup labs in September 4th, 10th, showed WBC of 12.3 then 13.8 and platelets of 523 and 537. Patient had monocyte predominance of WBC. I do not yet have  older labs.   His shoulder pain initially improved after injection but now with some pain again.  He returned today for followup and saw Dr. Mardelle Matte who was very happy with his progress.  He is without fevers and he is regaining energy and feels much better   Review of Systems  Constitutional: Positive for fatigue. Negative for fever, chills, diaphoresis, activity change, appetite change and unexpected weight change.  HENT: Negative for congestion, rhinorrhea, sinus pressure, sneezing, sore throat and trouble swallowing.   Eyes: Negative for photophobia and visual disturbance.  Respiratory: Negative for cough, chest tightness, shortness of breath, wheezing and stridor.   Cardiovascular: Negative for chest pain, palpitations and leg swelling.  Gastrointestinal: Negative for nausea, vomiting, abdominal pain, diarrhea, constipation, blood in stool and abdominal distention.  Genitourinary: Negative for dysuria, hematuria, flank pain and difficulty urinating.  Musculoskeletal: Negative for myalgias, back pain, joint swelling, arthralgias and gait problem.  Skin: Negative for color change, pallor, rash and wound.  Neurological: Negative for dizziness, tremors, weakness and light-headedness.  Hematological: Negative for adenopathy. Does not bruise/bleed easily.  Psychiatric/Behavioral: Negative for behavioral problems, confusion, sleep disturbance, dysphoric mood, decreased concentration and agitation.       Objective:   Physical Exam  Constitutional: He is oriented to person, place, and time. He appears well-developed and well-nourished. No distress.  HENT:  Head: Normocephalic and atraumatic.  Mouth/Throat: Oropharynx is clear and moist. No oropharyngeal exudate.  Eyes: Conjunctivae and EOM are normal. Pupils are equal, round, and reactive to light. No scleral icterus.  Neck: Normal range of motion. Neck supple. No JVD present.  Cardiovascular: Normal rate, regular rhythm and normal heart  sounds.  Exam reveals no gallop and no friction rub.   No murmur heard. Pulmonary/Chest: Effort normal and breath sounds normal. No respiratory distress. He has no wheezes. He has no rales. He exhibits no tenderness.  Abdominal: He exhibits no distension and no mass. There is no tenderness. There is no rebound and no guarding.  Musculoskeletal: He exhibits no edema.       Right shoulder: He exhibits decreased range of motion and tenderness.       Arms: Lymphadenopathy:    He has no cervical adenopathy.  Neurological: He is alert and oriented to person, place, and time. He exhibits normal muscle tone. Coordination normal.  Skin: Skin is warm and dry. Rash noted. He is not diaphoretic. No erythema. No pallor.  Psychiatric: He has a normal mood and affect. His behavior is normal. Judgment and thought content normal.  Nursing note and vitals reviewed.         Assessment & Plan:   Leukocytosis and thrombophilia of unknown origin: I wonder if this patient has baseline leukocytosis and elevated WBC but not to the level that he had this fall  He clinically seems to be doing well and I DO NOT SEE NEED to subject him to further lab work or radiographic evaluation at this time.  I have encouraged him and his wife to obtain old records from Dr. Pennie Banter office (wife says she already has many) and bring all CBC's from past  5-10 years to the office so  they can be reviewed with her  We will see him back as needed.  I spent greater than 25 minutes with the patient including greater than 50% of time in face to face counsel of the patient and in coordination of their care.

## 2014-08-27 DIAGNOSIS — E11329 Type 2 diabetes mellitus with mild nonproliferative diabetic retinopathy without macular edema: Secondary | ICD-10-CM | POA: Diagnosis not present

## 2014-08-27 DIAGNOSIS — E11321 Type 2 diabetes mellitus with mild nonproliferative diabetic retinopathy with macular edema: Secondary | ICD-10-CM | POA: Diagnosis not present

## 2014-08-31 DIAGNOSIS — S29012A Strain of muscle and tendon of back wall of thorax, initial encounter: Secondary | ICD-10-CM | POA: Diagnosis not present

## 2014-08-31 DIAGNOSIS — M9905 Segmental and somatic dysfunction of pelvic region: Secondary | ICD-10-CM | POA: Diagnosis not present

## 2014-08-31 DIAGNOSIS — M5432 Sciatica, left side: Secondary | ICD-10-CM | POA: Diagnosis not present

## 2014-08-31 DIAGNOSIS — M9903 Segmental and somatic dysfunction of lumbar region: Secondary | ICD-10-CM | POA: Diagnosis not present

## 2014-08-31 DIAGNOSIS — S39012A Strain of muscle, fascia and tendon of lower back, initial encounter: Secondary | ICD-10-CM | POA: Diagnosis not present

## 2014-08-31 DIAGNOSIS — M9902 Segmental and somatic dysfunction of thoracic region: Secondary | ICD-10-CM | POA: Diagnosis not present

## 2014-09-03 DIAGNOSIS — M9902 Segmental and somatic dysfunction of thoracic region: Secondary | ICD-10-CM | POA: Diagnosis not present

## 2014-09-03 DIAGNOSIS — S39012A Strain of muscle, fascia and tendon of lower back, initial encounter: Secondary | ICD-10-CM | POA: Diagnosis not present

## 2014-09-03 DIAGNOSIS — M9905 Segmental and somatic dysfunction of pelvic region: Secondary | ICD-10-CM | POA: Diagnosis not present

## 2014-09-03 DIAGNOSIS — M5432 Sciatica, left side: Secondary | ICD-10-CM | POA: Diagnosis not present

## 2014-09-03 DIAGNOSIS — S29012A Strain of muscle and tendon of back wall of thorax, initial encounter: Secondary | ICD-10-CM | POA: Diagnosis not present

## 2014-09-03 DIAGNOSIS — M9903 Segmental and somatic dysfunction of lumbar region: Secondary | ICD-10-CM | POA: Diagnosis not present

## 2014-09-08 DIAGNOSIS — M5432 Sciatica, left side: Secondary | ICD-10-CM | POA: Diagnosis not present

## 2014-09-08 DIAGNOSIS — E119 Type 2 diabetes mellitus without complications: Secondary | ICD-10-CM | POA: Diagnosis not present

## 2014-09-08 DIAGNOSIS — S39012A Strain of muscle, fascia and tendon of lower back, initial encounter: Secondary | ICD-10-CM | POA: Diagnosis not present

## 2014-09-08 DIAGNOSIS — M9903 Segmental and somatic dysfunction of lumbar region: Secondary | ICD-10-CM | POA: Diagnosis not present

## 2014-09-08 DIAGNOSIS — E785 Hyperlipidemia, unspecified: Secondary | ICD-10-CM | POA: Diagnosis not present

## 2014-09-08 DIAGNOSIS — M9902 Segmental and somatic dysfunction of thoracic region: Secondary | ICD-10-CM | POA: Diagnosis not present

## 2014-09-08 DIAGNOSIS — I10 Essential (primary) hypertension: Secondary | ICD-10-CM | POA: Diagnosis not present

## 2014-09-08 DIAGNOSIS — S29012A Strain of muscle and tendon of back wall of thorax, initial encounter: Secondary | ICD-10-CM | POA: Diagnosis not present

## 2014-09-08 DIAGNOSIS — M9905 Segmental and somatic dysfunction of pelvic region: Secondary | ICD-10-CM | POA: Diagnosis not present

## 2014-09-10 DIAGNOSIS — I10 Essential (primary) hypertension: Secondary | ICD-10-CM | POA: Diagnosis not present

## 2014-09-10 DIAGNOSIS — S39012A Strain of muscle, fascia and tendon of lower back, initial encounter: Secondary | ICD-10-CM | POA: Diagnosis not present

## 2014-09-10 DIAGNOSIS — E785 Hyperlipidemia, unspecified: Secondary | ICD-10-CM | POA: Diagnosis not present

## 2014-09-10 DIAGNOSIS — M9905 Segmental and somatic dysfunction of pelvic region: Secondary | ICD-10-CM | POA: Diagnosis not present

## 2014-09-10 DIAGNOSIS — E119 Type 2 diabetes mellitus without complications: Secondary | ICD-10-CM | POA: Diagnosis not present

## 2014-09-10 DIAGNOSIS — M5432 Sciatica, left side: Secondary | ICD-10-CM | POA: Diagnosis not present

## 2014-09-10 DIAGNOSIS — S29012A Strain of muscle and tendon of back wall of thorax, initial encounter: Secondary | ICD-10-CM | POA: Diagnosis not present

## 2014-09-10 DIAGNOSIS — M9903 Segmental and somatic dysfunction of lumbar region: Secondary | ICD-10-CM | POA: Diagnosis not present

## 2014-09-10 DIAGNOSIS — M9902 Segmental and somatic dysfunction of thoracic region: Secondary | ICD-10-CM | POA: Diagnosis not present

## 2014-09-17 ENCOUNTER — Telehealth: Payer: Self-pay | Admitting: Hematology and Oncology

## 2014-09-17 DIAGNOSIS — M9903 Segmental and somatic dysfunction of lumbar region: Secondary | ICD-10-CM | POA: Diagnosis not present

## 2014-09-17 DIAGNOSIS — S39012A Strain of muscle, fascia and tendon of lower back, initial encounter: Secondary | ICD-10-CM | POA: Diagnosis not present

## 2014-09-17 DIAGNOSIS — M5432 Sciatica, left side: Secondary | ICD-10-CM | POA: Diagnosis not present

## 2014-09-17 DIAGNOSIS — S29012A Strain of muscle and tendon of back wall of thorax, initial encounter: Secondary | ICD-10-CM | POA: Diagnosis not present

## 2014-09-17 DIAGNOSIS — M9902 Segmental and somatic dysfunction of thoracic region: Secondary | ICD-10-CM | POA: Diagnosis not present

## 2014-09-17 DIAGNOSIS — M9905 Segmental and somatic dysfunction of pelvic region: Secondary | ICD-10-CM | POA: Diagnosis not present

## 2014-09-17 NOTE — Telephone Encounter (Signed)
Patient wife called and said they are going out of town and canceled 05/03 appointment. Will call back once in town unsure when returning.

## 2014-09-21 DIAGNOSIS — R351 Nocturia: Secondary | ICD-10-CM | POA: Diagnosis not present

## 2014-09-21 DIAGNOSIS — N401 Enlarged prostate with lower urinary tract symptoms: Secondary | ICD-10-CM | POA: Diagnosis not present

## 2014-09-22 DIAGNOSIS — L570 Actinic keratosis: Secondary | ICD-10-CM | POA: Diagnosis not present

## 2014-09-22 DIAGNOSIS — L821 Other seborrheic keratosis: Secondary | ICD-10-CM | POA: Diagnosis not present

## 2014-09-22 DIAGNOSIS — L853 Xerosis cutis: Secondary | ICD-10-CM | POA: Diagnosis not present

## 2014-09-22 DIAGNOSIS — Z85828 Personal history of other malignant neoplasm of skin: Secondary | ICD-10-CM | POA: Diagnosis not present

## 2014-09-22 DIAGNOSIS — D485 Neoplasm of uncertain behavior of skin: Secondary | ICD-10-CM | POA: Diagnosis not present

## 2014-09-22 DIAGNOSIS — L812 Freckles: Secondary | ICD-10-CM | POA: Diagnosis not present

## 2014-09-22 DIAGNOSIS — L820 Inflamed seborrheic keratosis: Secondary | ICD-10-CM | POA: Diagnosis not present

## 2014-09-29 ENCOUNTER — Ambulatory Visit: Payer: Medicare Other | Admitting: Hematology and Oncology

## 2014-09-29 ENCOUNTER — Other Ambulatory Visit: Payer: Medicare Other

## 2014-10-01 DIAGNOSIS — S29012A Strain of muscle and tendon of back wall of thorax, initial encounter: Secondary | ICD-10-CM | POA: Diagnosis not present

## 2014-10-01 DIAGNOSIS — S39012A Strain of muscle, fascia and tendon of lower back, initial encounter: Secondary | ICD-10-CM | POA: Diagnosis not present

## 2014-10-01 DIAGNOSIS — M9905 Segmental and somatic dysfunction of pelvic region: Secondary | ICD-10-CM | POA: Diagnosis not present

## 2014-10-01 DIAGNOSIS — M5432 Sciatica, left side: Secondary | ICD-10-CM | POA: Diagnosis not present

## 2014-10-01 DIAGNOSIS — M9902 Segmental and somatic dysfunction of thoracic region: Secondary | ICD-10-CM | POA: Diagnosis not present

## 2014-10-01 DIAGNOSIS — M9903 Segmental and somatic dysfunction of lumbar region: Secondary | ICD-10-CM | POA: Diagnosis not present

## 2014-10-29 DIAGNOSIS — M5432 Sciatica, left side: Secondary | ICD-10-CM | POA: Diagnosis not present

## 2014-10-29 DIAGNOSIS — M9903 Segmental and somatic dysfunction of lumbar region: Secondary | ICD-10-CM | POA: Diagnosis not present

## 2014-10-29 DIAGNOSIS — S39012A Strain of muscle, fascia and tendon of lower back, initial encounter: Secondary | ICD-10-CM | POA: Diagnosis not present

## 2014-10-29 DIAGNOSIS — M9905 Segmental and somatic dysfunction of pelvic region: Secondary | ICD-10-CM | POA: Diagnosis not present

## 2014-10-29 DIAGNOSIS — M9902 Segmental and somatic dysfunction of thoracic region: Secondary | ICD-10-CM | POA: Diagnosis not present

## 2014-10-29 DIAGNOSIS — S29012A Strain of muscle and tendon of back wall of thorax, initial encounter: Secondary | ICD-10-CM | POA: Diagnosis not present

## 2014-11-13 DIAGNOSIS — E119 Type 2 diabetes mellitus without complications: Secondary | ICD-10-CM | POA: Diagnosis not present

## 2014-11-13 DIAGNOSIS — E785 Hyperlipidemia, unspecified: Secondary | ICD-10-CM | POA: Diagnosis not present

## 2014-11-13 DIAGNOSIS — Z1389 Encounter for screening for other disorder: Secondary | ICD-10-CM | POA: Diagnosis not present

## 2014-11-13 DIAGNOSIS — I10 Essential (primary) hypertension: Secondary | ICD-10-CM | POA: Diagnosis not present

## 2014-12-09 DIAGNOSIS — H25813 Combined forms of age-related cataract, bilateral: Secondary | ICD-10-CM | POA: Diagnosis not present

## 2014-12-09 DIAGNOSIS — H01001 Unspecified blepharitis right upper eyelid: Secondary | ICD-10-CM | POA: Diagnosis not present

## 2014-12-09 DIAGNOSIS — E11331 Type 2 diabetes mellitus with moderate nonproliferative diabetic retinopathy with macular edema: Secondary | ICD-10-CM | POA: Diagnosis not present

## 2015-01-07 DIAGNOSIS — E11329 Type 2 diabetes mellitus with mild nonproliferative diabetic retinopathy without macular edema: Secondary | ICD-10-CM | POA: Diagnosis not present

## 2015-01-07 DIAGNOSIS — E11321 Type 2 diabetes mellitus with mild nonproliferative diabetic retinopathy with macular edema: Secondary | ICD-10-CM | POA: Diagnosis not present

## 2015-01-12 DIAGNOSIS — E119 Type 2 diabetes mellitus without complications: Secondary | ICD-10-CM | POA: Diagnosis not present

## 2015-01-12 DIAGNOSIS — E785 Hyperlipidemia, unspecified: Secondary | ICD-10-CM | POA: Diagnosis not present

## 2015-01-12 DIAGNOSIS — I10 Essential (primary) hypertension: Secondary | ICD-10-CM | POA: Diagnosis not present

## 2015-01-14 DIAGNOSIS — E785 Hyperlipidemia, unspecified: Secondary | ICD-10-CM | POA: Diagnosis not present

## 2015-01-14 DIAGNOSIS — I10 Essential (primary) hypertension: Secondary | ICD-10-CM | POA: Diagnosis not present

## 2015-01-14 DIAGNOSIS — E119 Type 2 diabetes mellitus without complications: Secondary | ICD-10-CM | POA: Diagnosis not present

## 2015-01-19 DIAGNOSIS — H179 Unspecified corneal scar and opacity: Secondary | ICD-10-CM | POA: Diagnosis not present

## 2015-01-19 DIAGNOSIS — H25031 Anterior subcapsular polar age-related cataract, right eye: Secondary | ICD-10-CM | POA: Diagnosis not present

## 2015-01-19 DIAGNOSIS — H25811 Combined forms of age-related cataract, right eye: Secondary | ICD-10-CM | POA: Diagnosis not present

## 2015-01-19 DIAGNOSIS — E11311 Type 2 diabetes mellitus with unspecified diabetic retinopathy with macular edema: Secondary | ICD-10-CM | POA: Diagnosis not present

## 2015-01-19 DIAGNOSIS — H2511 Age-related nuclear cataract, right eye: Secondary | ICD-10-CM | POA: Diagnosis not present

## 2015-01-19 DIAGNOSIS — H21561 Pupillary abnormality, right eye: Secondary | ICD-10-CM | POA: Diagnosis not present

## 2015-01-19 DIAGNOSIS — H2181 Floppy iris syndrome: Secondary | ICD-10-CM | POA: Diagnosis not present

## 2015-01-19 DIAGNOSIS — H25011 Cortical age-related cataract, right eye: Secondary | ICD-10-CM | POA: Diagnosis not present

## 2015-02-18 DIAGNOSIS — E11321 Type 2 diabetes mellitus with mild nonproliferative diabetic retinopathy with macular edema: Secondary | ICD-10-CM | POA: Diagnosis not present

## 2015-03-18 DIAGNOSIS — I10 Essential (primary) hypertension: Secondary | ICD-10-CM | POA: Diagnosis not present

## 2015-03-18 DIAGNOSIS — K219 Gastro-esophageal reflux disease without esophagitis: Secondary | ICD-10-CM | POA: Diagnosis not present

## 2015-03-18 DIAGNOSIS — Z0000 Encounter for general adult medical examination without abnormal findings: Secondary | ICD-10-CM | POA: Diagnosis not present

## 2015-03-18 DIAGNOSIS — Z125 Encounter for screening for malignant neoplasm of prostate: Secondary | ICD-10-CM | POA: Diagnosis not present

## 2015-03-18 DIAGNOSIS — Z23 Encounter for immunization: Secondary | ICD-10-CM | POA: Diagnosis not present

## 2015-03-23 DIAGNOSIS — E785 Hyperlipidemia, unspecified: Secondary | ICD-10-CM | POA: Diagnosis not present

## 2015-03-23 DIAGNOSIS — I10 Essential (primary) hypertension: Secondary | ICD-10-CM | POA: Diagnosis not present

## 2015-03-23 DIAGNOSIS — I6529 Occlusion and stenosis of unspecified carotid artery: Secondary | ICD-10-CM | POA: Diagnosis not present

## 2015-03-23 DIAGNOSIS — E119 Type 2 diabetes mellitus without complications: Secondary | ICD-10-CM | POA: Diagnosis not present

## 2015-03-25 DIAGNOSIS — E113311 Type 2 diabetes mellitus with moderate nonproliferative diabetic retinopathy with macular edema, right eye: Secondary | ICD-10-CM | POA: Diagnosis not present

## 2015-04-01 DIAGNOSIS — E119 Type 2 diabetes mellitus without complications: Secondary | ICD-10-CM | POA: Diagnosis not present

## 2015-04-01 DIAGNOSIS — I10 Essential (primary) hypertension: Secondary | ICD-10-CM | POA: Diagnosis not present

## 2015-04-01 DIAGNOSIS — E785 Hyperlipidemia, unspecified: Secondary | ICD-10-CM | POA: Diagnosis not present

## 2015-04-05 DIAGNOSIS — I10 Essential (primary) hypertension: Secondary | ICD-10-CM | POA: Diagnosis not present

## 2015-04-05 DIAGNOSIS — E785 Hyperlipidemia, unspecified: Secondary | ICD-10-CM | POA: Diagnosis not present

## 2015-04-05 DIAGNOSIS — E119 Type 2 diabetes mellitus without complications: Secondary | ICD-10-CM | POA: Diagnosis not present

## 2015-04-07 ENCOUNTER — Encounter: Payer: Self-pay | Admitting: Internal Medicine

## 2015-04-12 ENCOUNTER — Ambulatory Visit (INDEPENDENT_AMBULATORY_CARE_PROVIDER_SITE_OTHER): Payer: Medicare Other | Admitting: Internal Medicine

## 2015-04-12 ENCOUNTER — Encounter: Payer: Self-pay | Admitting: Internal Medicine

## 2015-04-12 VITALS — BP 144/66 | HR 67 | Ht 66.5 in | Wt 187.5 lb

## 2015-04-12 DIAGNOSIS — I2510 Atherosclerotic heart disease of native coronary artery without angina pectoris: Secondary | ICD-10-CM

## 2015-04-12 DIAGNOSIS — Z794 Long term (current) use of insulin: Secondary | ICD-10-CM

## 2015-04-12 DIAGNOSIS — I2583 Coronary atherosclerosis due to lipid rich plaque: Secondary | ICD-10-CM

## 2015-04-12 DIAGNOSIS — E785 Hyperlipidemia, unspecified: Secondary | ICD-10-CM | POA: Diagnosis not present

## 2015-04-12 DIAGNOSIS — I10 Essential (primary) hypertension: Secondary | ICD-10-CM

## 2015-04-12 DIAGNOSIS — E119 Type 2 diabetes mellitus without complications: Secondary | ICD-10-CM | POA: Diagnosis not present

## 2015-04-12 DIAGNOSIS — IMO0001 Reserved for inherently not codable concepts without codable children: Secondary | ICD-10-CM

## 2015-04-12 NOTE — Progress Notes (Signed)
OFFICE NOTE  Chief Complaint:  Routine follow-up  Primary Care Physician: Horatio Pel, MD  HPI:  Dustin Mccarthy  Is a 74 year old gentleman who has diabetes type 2, hypertension, dyslipidemia, and obesity. He is on insulin therapy and had a heart catheterization in 2010, which was negative, because of an abnormal stress test. He has also recently had some worsening shortness of breath and difficulty with weight loss. I recommended a metabolic test to further evaluate his shortness of breath. That was performed on March 12, 2012. It showed an RER of 1.06, a peak VO2 of 84% predicted. Heart rate was 87% predicted and the heart rate and VO2 curve showed a good agreement until anaerobic threshold with some flattening of his VO2 curve suggestive of an ischemic response. The VO2, however, is high enough and greater than 60, typically a cutoff for underlying coronary disease. This is a low-risk study and suggests small vessel ischemia. I recommended aerobic exercise and adding l-arginine 3 g p.o. t.i.d. to the diet. He actually did both of these things and reported some marked improvement in his shortness of breath. He did gardening for about 6 months and discontinued it. His exercise is ongoing he is managed to lose 6-8 pounds. He says he feels better and is not bothered by shortness of breath. He been followed closely by Sandi Mariscal (PharmD) at Dr. Peterson [place] office. He reports that his diabetes is fairly well controlled with an A1c of 6.9. He is cholesterol is also at goal.  Dustin Mccarthy returns today for follow-up. Recently he's been having some issues after he returned from a long visit in [place]. He has been noted to have high platelets, and elevated white blood cell count and anemia. He's been seen by hematology and they are fairly convinced he does not have a hematologic malignancy. He is also being seen by infectious diseases for possible exposure related abnormalities in his blood work.  He denies any chest pain or worsening shortness of breath but does have significant fatigue. He denies any fevers, chills or constitutional symptoms. He did have a lipid profile in September which showed total cholesterol of 96, triglycerides 57, HDL 35 and LDL of 50.  Dustin Mccarthy returns today for follow-up. Overall he is doing well denies any chest pain or worsening shortness of breath. Blood pressure was mildly elevated at 144/66 however recheck was 132/64. He says recently his blood pressure may be running slightly higher. I've asked him to follow this at home and talk with his primary care provider when he sees him in follow-up about it. A1c is 6.9 and he maintains on insulin and metformin. Cholesterol was recently checked and at goal. He denies any chest pain or worsening shortness of breath.  PMHx:  Past Medical History  Diagnosis Date  . Type 2 diabetes mellitus ([place])   . Hypertension   . Hyperlipidemia   . Obesity   . CAD (coronary artery disease)     mild (cath 2010)  . DOE (dyspnea on exertion)   . Herpes simplex conjunctivitis   . Arthritis   . Unspecified deficiency anemia 02/16/2014  . Weight loss 02/16/2014  . Right shoulder pain 02/24/2014  . Constipation 02/24/2014  . Hyponatremia 02/24/2014    Past Surgical History  Procedure Laterality Date  . Cardiac catheterization  2010    after abnormal stress test (03/2009) - mild coronary disease  . Cardiometabolic testing  0000000    RER of 1.06, peak VO2 84% predicted; HR 87%  predicted  . Back surgery  1988  . Hernia repair    . Skull fracture surgery  1945  . Transthoracic echocardiogram  03/2009    EF 45-50%, mild conc LVH, mod septal hypokinesis, mod apical wall hypokinesis; RV systolic function borderline reduced; trace MR/TR    FAMHx:  Family History  Problem Relation Age of Onset  . Hypertension Mother   . Diabetes Mother   . Heart attack Mother   . Stroke Mother   . Parkinson's disease Father   . Heart disease  Father   . Diabetes Child   . Cancer Paternal Uncle     throat ca    SOCHx:   reports that he quit smoking about 36 years ago. He has never used smokeless tobacco. He reports that he drinks about 1.5 - 2.0 oz of alcohol per week. He reports that he does not use illicit drugs.  ALLERGIES:  Allergies  Allergen Reactions  . Bee Venom   . Iodine   . Oxycodone Nausea And Vomiting    Cold sweats, n/v and "out of it."    ROS: A comprehensive review of systems was negative.  HOME MEDS: Current Outpatient Prescriptions  Medication Sig Dispense Refill  . ACCU-CHEK AVIVA PLUS test strip 1 strip by Other route 2 (two) times daily. Use 1 strip to check glucose twice a day  0  . acyclovir (ZOVIRAX) 400 MG tablet Take 400 mg by mouth 2 (two) times daily.    Marland Kitchen amLODipine (NORVASC) 5 MG tablet Take 5 mg by mouth daily.    Marland Kitchen aspirin 81 MG tablet Take 81 mg by mouth daily.    Marland Kitchen atorvastatin (LIPITOR) 40 MG tablet Take 40 mg by mouth daily.    . insulin glargine (LANTUS) 100 UNIT/ML injection Inject 20 Units into the skin at bedtime.     . insulin glulisine (APIDRA) 100 UNIT/ML injection Inject 8 Units into the skin 2 (two) times daily with a meal.     . losartan (COZAAR) 50 MG tablet Take 1 tablet by mouth daily. Take 1 tab daily    . metFORMIN (GLUCOPHAGE) 1000 MG tablet Take 1,000 mg by mouth 2 (two) times daily with a meal.    . pantoprazole (PROTONIX) 40 MG tablet Take 40 mg by mouth daily.    . tamsulosin (FLOMAX) 0.4 MG CAPS capsule Take 0.4 mg by mouth daily.    . Thiamine HCl (VITAMIN B-1 PO) Take by mouth daily.     No current facility-administered medications for this visit.    LABS/IMAGING: No results found for this or any previous visit (from the past 48 hour(s)). No results found.  VITALS: BP 144/66 mmHg  Pulse 67  Ht 5' 6.5" (1.689 m)  Wt 187 lb 8 oz (85.049 kg)  BMI 29.81 kg/m2  EXAM: General appearance: alert and no distress Neck: no carotid bruit and no JVD Lungs:  clear to auscultation bilaterally Heart: regular rate and rhythm, S1, S2 normal, no murmur, click, rub or gallop Abdomen: soft, non-tender; bowel sounds normal; no masses,  no organomegaly Extremities: extremities normal, atraumatic, no cyanosis or edema Pulses: 2+ and symmetric Skin: Skin color, texture, turgor normal. No rashes or lesions Neurologic: Grossly normal Psych: Mood, affect normal  EKG: Normal sinus rhythm at 67  ASSESSMENT: 1. Hypertension likely at goal 2. Dyslipidemia on atorvastatin-at goal 3. Insulin-dependent diabetes-A1c 6.9 4. Fatigue 5. Mild coronary artery disease by cath in 2010  PLAN: 1.   Dustin Mccarthy seems to have  recovered from whatever abnormality caused changes in his complete blood count. He had an extensive workup by infectious diseases and his primary care provider with no clear etiology. Possible viral syndrome is suspected. He has no symptoms concerning for coronary artery disease at this time. Blood pressure is likely at goal although needs to be monitored. Cholesterol is at goal and his diabetes is fairly well-controlled. No changes on his medications at this time we'll plan to see him back annually or sooner as necessary.  Pixie Casino, MD, [facility] Attending Cardiologist [place] 04/12/2015, 1:15 PM

## 2015-04-12 NOTE — Patient Instructions (Signed)
Your physician wants you to follow-up in: 1 year with Dr. Hilty. You will receive a reminder letter in the mail two months in advance. If you don't receive a letter, please call our office to schedule the follow-up appointment.  

## 2015-04-29 DIAGNOSIS — R490 Dysphonia: Secondary | ICD-10-CM | POA: Diagnosis not present

## 2015-04-29 DIAGNOSIS — I10 Essential (primary) hypertension: Secondary | ICD-10-CM | POA: Diagnosis not present

## 2015-07-05 DIAGNOSIS — E785 Hyperlipidemia, unspecified: Secondary | ICD-10-CM | POA: Diagnosis not present

## 2015-07-05 DIAGNOSIS — E119 Type 2 diabetes mellitus without complications: Secondary | ICD-10-CM | POA: Diagnosis not present

## 2015-07-05 DIAGNOSIS — I10 Essential (primary) hypertension: Secondary | ICD-10-CM | POA: Diagnosis not present

## 2015-07-08 DIAGNOSIS — E119 Type 2 diabetes mellitus without complications: Secondary | ICD-10-CM | POA: Diagnosis not present

## 2015-07-08 DIAGNOSIS — E113211 Type 2 diabetes mellitus with mild nonproliferative diabetic retinopathy with macular edema, right eye: Secondary | ICD-10-CM | POA: Diagnosis not present

## 2015-07-08 DIAGNOSIS — E113292 Type 2 diabetes mellitus with mild nonproliferative diabetic retinopathy without macular edema, left eye: Secondary | ICD-10-CM | POA: Diagnosis not present

## 2015-07-08 DIAGNOSIS — E785 Hyperlipidemia, unspecified: Secondary | ICD-10-CM | POA: Diagnosis not present

## 2015-07-08 DIAGNOSIS — I10 Essential (primary) hypertension: Secondary | ICD-10-CM | POA: Diagnosis not present

## 2015-09-23 DIAGNOSIS — Z125 Encounter for screening for malignant neoplasm of prostate: Secondary | ICD-10-CM | POA: Diagnosis not present

## 2015-09-23 DIAGNOSIS — Z0000 Encounter for general adult medical examination without abnormal findings: Secondary | ICD-10-CM | POA: Diagnosis not present

## 2015-09-23 DIAGNOSIS — L812 Freckles: Secondary | ICD-10-CM | POA: Diagnosis not present

## 2015-09-23 DIAGNOSIS — Z85828 Personal history of other malignant neoplasm of skin: Secondary | ICD-10-CM | POA: Diagnosis not present

## 2015-09-23 DIAGNOSIS — L565 Disseminated superficial actinic porokeratosis (DSAP): Secondary | ICD-10-CM | POA: Diagnosis not present

## 2015-09-23 DIAGNOSIS — L821 Other seborrheic keratosis: Secondary | ICD-10-CM | POA: Diagnosis not present

## 2015-09-23 DIAGNOSIS — R351 Nocturia: Secondary | ICD-10-CM | POA: Diagnosis not present

## 2015-09-23 DIAGNOSIS — L570 Actinic keratosis: Secondary | ICD-10-CM | POA: Diagnosis not present

## 2015-09-23 DIAGNOSIS — N401 Enlarged prostate with lower urinary tract symptoms: Secondary | ICD-10-CM | POA: Diagnosis not present

## 2015-09-23 DIAGNOSIS — N5201 Erectile dysfunction due to arterial insufficiency: Secondary | ICD-10-CM | POA: Diagnosis not present

## 2015-09-23 DIAGNOSIS — D1801 Hemangioma of skin and subcutaneous tissue: Secondary | ICD-10-CM | POA: Diagnosis not present

## 2015-09-28 DIAGNOSIS — E113211 Type 2 diabetes mellitus with mild nonproliferative diabetic retinopathy with macular edema, right eye: Secondary | ICD-10-CM | POA: Diagnosis not present

## 2015-10-05 DIAGNOSIS — E118 Type 2 diabetes mellitus with unspecified complications: Secondary | ICD-10-CM | POA: Diagnosis not present

## 2015-10-05 DIAGNOSIS — E785 Hyperlipidemia, unspecified: Secondary | ICD-10-CM | POA: Diagnosis not present

## 2015-10-07 DIAGNOSIS — E119 Type 2 diabetes mellitus without complications: Secondary | ICD-10-CM | POA: Diagnosis not present

## 2015-10-07 DIAGNOSIS — E785 Hyperlipidemia, unspecified: Secondary | ICD-10-CM | POA: Diagnosis not present

## 2015-10-07 DIAGNOSIS — I10 Essential (primary) hypertension: Secondary | ICD-10-CM | POA: Diagnosis not present

## 2015-10-07 DIAGNOSIS — R490 Dysphonia: Secondary | ICD-10-CM | POA: Diagnosis not present

## 2015-10-11 DIAGNOSIS — E119 Type 2 diabetes mellitus without complications: Secondary | ICD-10-CM | POA: Diagnosis not present

## 2015-10-15 DIAGNOSIS — Z0100 Encounter for examination of eyes and vision without abnormal findings: Secondary | ICD-10-CM | POA: Diagnosis not present

## 2015-10-15 DIAGNOSIS — E113311 Type 2 diabetes mellitus with moderate nonproliferative diabetic retinopathy with macular edema, right eye: Secondary | ICD-10-CM | POA: Diagnosis not present

## 2015-10-15 DIAGNOSIS — H26491 Other secondary cataract, right eye: Secondary | ICD-10-CM | POA: Diagnosis not present

## 2015-10-15 DIAGNOSIS — H25812 Combined forms of age-related cataract, left eye: Secondary | ICD-10-CM | POA: Diagnosis not present

## 2015-10-18 DIAGNOSIS — E119 Type 2 diabetes mellitus without complications: Secondary | ICD-10-CM | POA: Diagnosis not present

## 2015-10-28 DIAGNOSIS — E119 Type 2 diabetes mellitus without complications: Secondary | ICD-10-CM | POA: Diagnosis not present

## 2015-11-09 IMAGING — US US CAROTID DUPLEX BILAT
1 series · 13 of 24 positions shown · non-contrast
Comparison: None.

CLINICAL DATA: Carotid artery stenosis.

EXAM:
BILATERAL CAROTID DUPLEX ULTRASOUND
TECHNIQUE: Gray scale imaging, color Doppler and duplex ultrasound were
performed of bilateral carotid and vertebral arteries in the neck.

[Series 1: us carotid duplex bilat · 0.07mm/px · 13 of 66 slices shown]
[im 1/66]
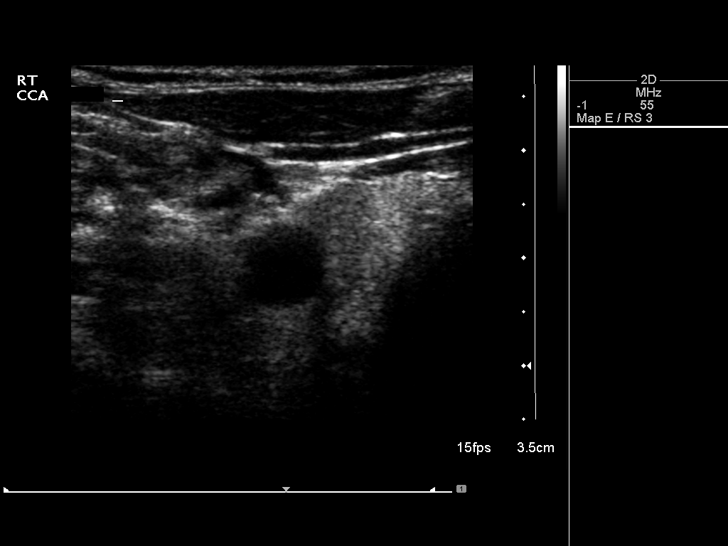
[im 6/66]
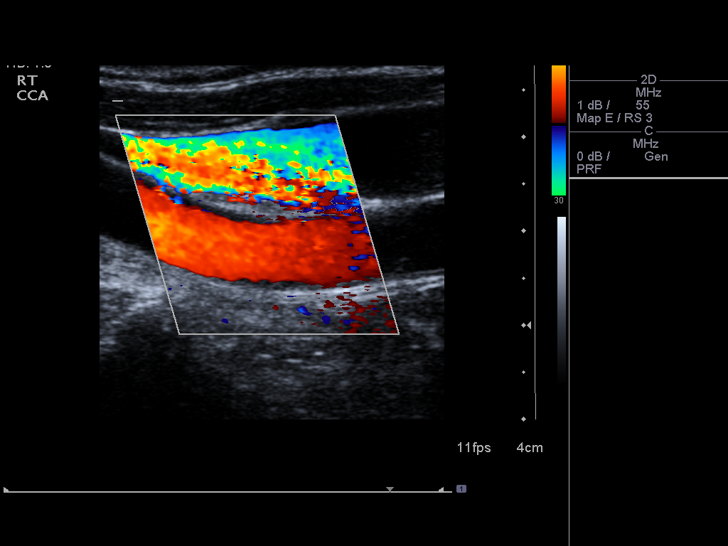
[im 12/66]
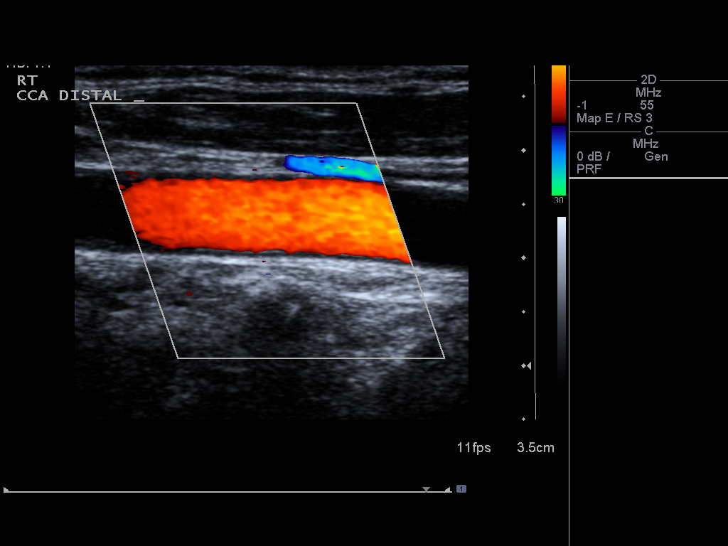
[im 17/66]
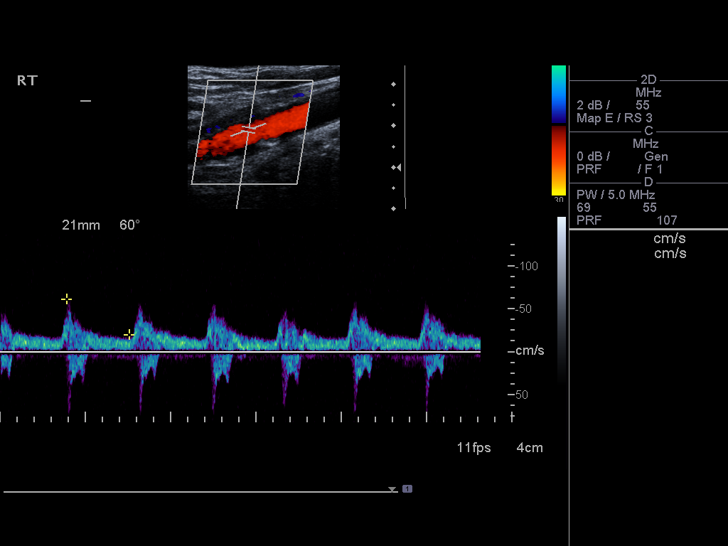
[im 23/66]
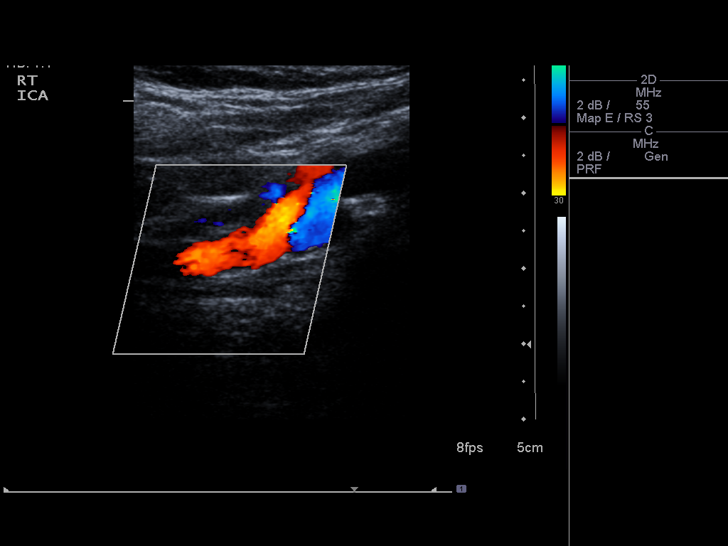
[im 29/66]
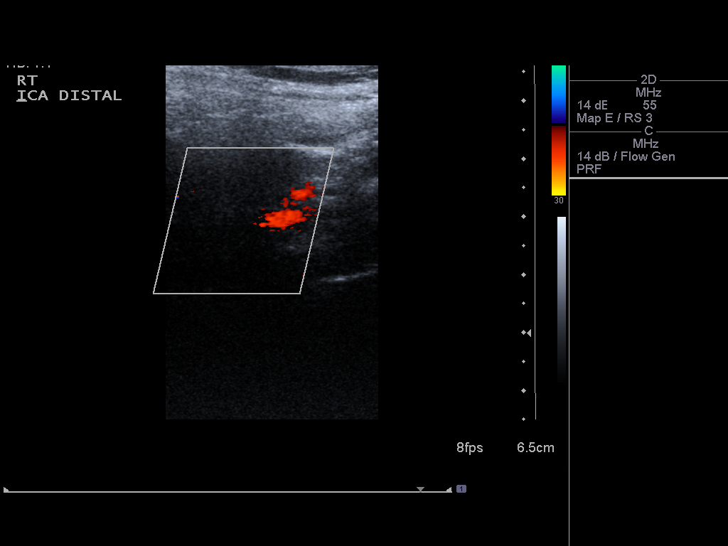
[im 34/66]
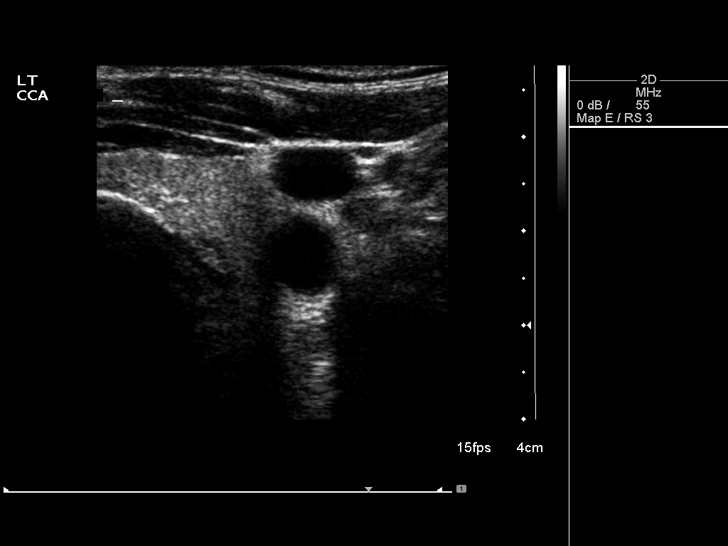
[im 37/66]
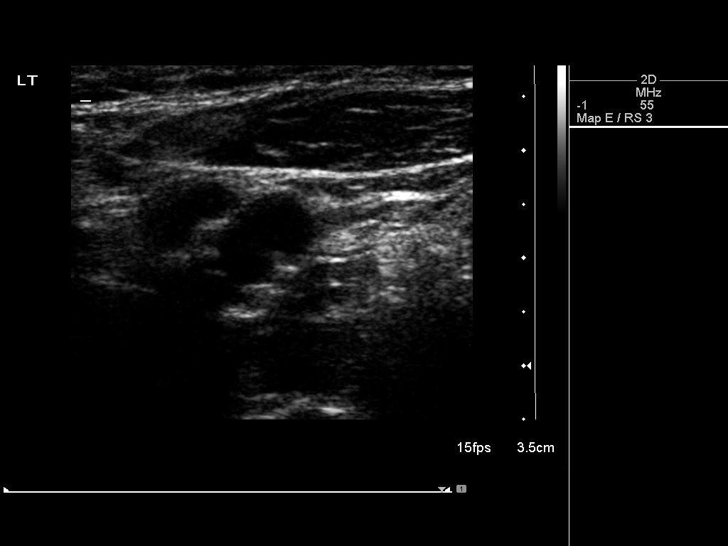
[im 43/66]
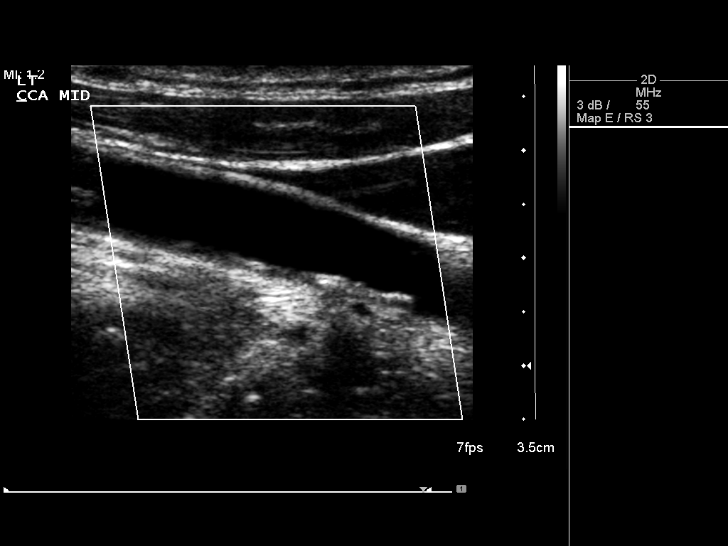
[im 49/66]
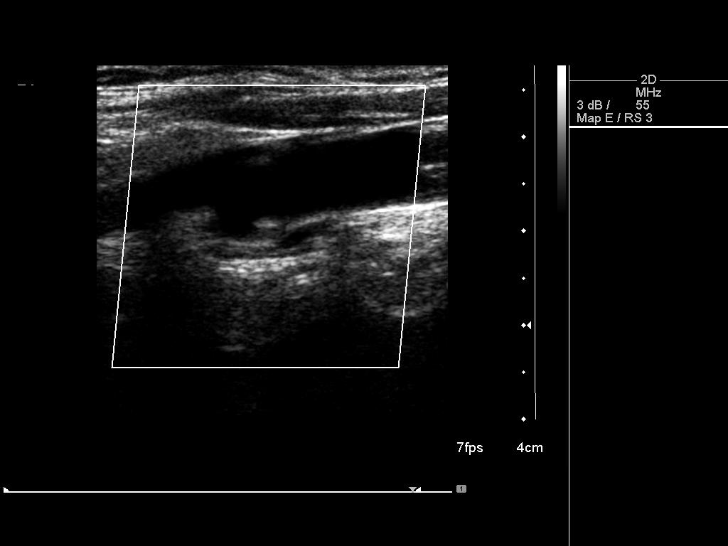
[im 54/66]
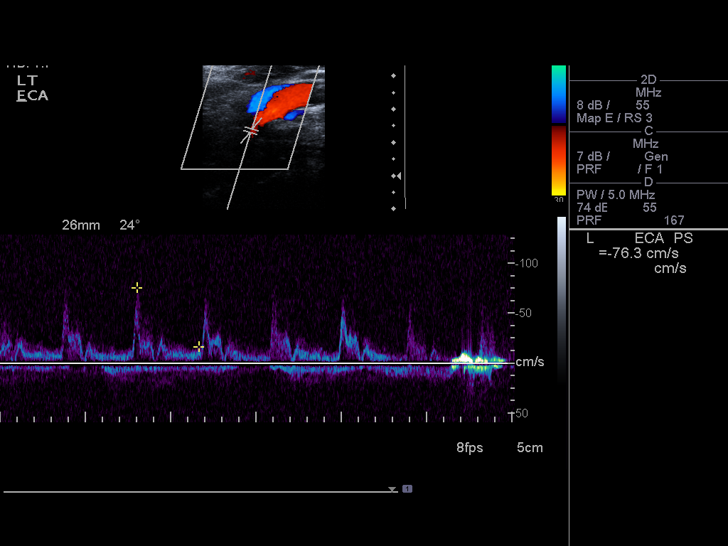
[im 60/66]
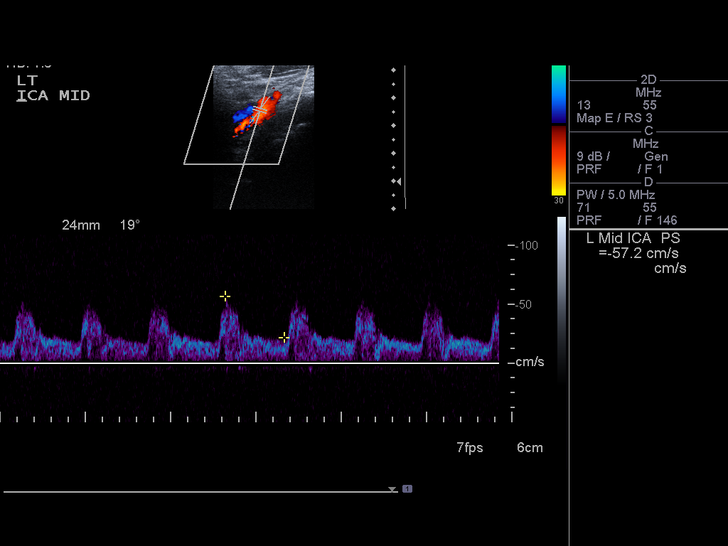
[im 66/66]
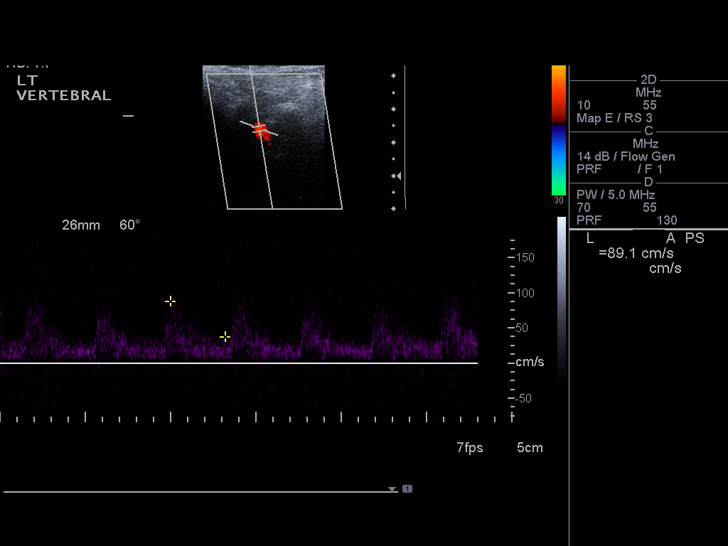

[13 of 24 positions shown; findings below may reference images not displayed]

FINDINGS: Criteria: Quantification of carotid stenosis is based on velocity
parameters that correlate the residual internal carotid diameter
with NASCET-based stenosis levels, using the diameter of the distal
internal carotid lumen as the denominator for stenosis measurement.

The following velocity measurements were obtained:

RIGHT

ICA:  81/26 cm/sec

CCA:  98/22 cm/sec

SYSTOLIC ICA/CCA RATIO:

DIASTOLIC ICA/CCA RATIO:

ECA:  114 cm/sec

LEFT

ICA:  74/23 cm/sec

CCA:  98/30 cm/sec

SYSTOLIC ICA/CCA RATIO:

DIASTOLIC ICA/CCA RATIO:

ECA:  81 cm/sec

RIGHT CAROTID ARTERY: Mild calcified plaque is noted in the right
carotid bulb and proximal internal carotid artery consistent with
less than 50% diameter stenosis based on ultrasound and Doppler
criteria.

RIGHT VERTEBRAL ARTERY:  Antegrade flow is noted.

LEFT CAROTID ARTERY: Mild calcified plaque is noted in the left
common carotid artery as well as the left carotid bulb and proximal
internal carotid artery consistent with less than 50% diameter
stenosis based on ultrasound and Doppler criteria.

LEFT VERTEBRAL ARTERY:  Antegrade flow is noted.
IMPRESSION: Mild calcified plaque is noted in the proximal portions of both
internal carotid arteries consistent with less than 50% diameter
stenosis.

## 2015-12-31 DIAGNOSIS — E119 Type 2 diabetes mellitus without complications: Secondary | ICD-10-CM | POA: Diagnosis not present

## 2015-12-31 DIAGNOSIS — E785 Hyperlipidemia, unspecified: Secondary | ICD-10-CM | POA: Diagnosis not present

## 2015-12-31 DIAGNOSIS — I10 Essential (primary) hypertension: Secondary | ICD-10-CM | POA: Diagnosis not present

## 2016-01-04 DIAGNOSIS — E119 Type 2 diabetes mellitus without complications: Secondary | ICD-10-CM | POA: Diagnosis not present

## 2016-01-18 DIAGNOSIS — E119 Type 2 diabetes mellitus without complications: Secondary | ICD-10-CM | POA: Diagnosis not present

## 2016-01-26 DIAGNOSIS — E113211 Type 2 diabetes mellitus with mild nonproliferative diabetic retinopathy with macular edema, right eye: Secondary | ICD-10-CM | POA: Diagnosis not present

## 2016-01-28 ENCOUNTER — Other Ambulatory Visit: Payer: Self-pay

## 2016-02-15 IMAGING — CR DG ORBITS FOR FOREIGN BODY
2 series · 2 of 2 positions shown · non-contrast
Comparison: None.

CLINICAL DATA: Metal working/exposure; clearance prior to MRI.
History of having metal removed from left eye.

EXAM:
ORBITS FOR FOREIGN BODY - 2 VIEW

[w waters (1 of 2)]
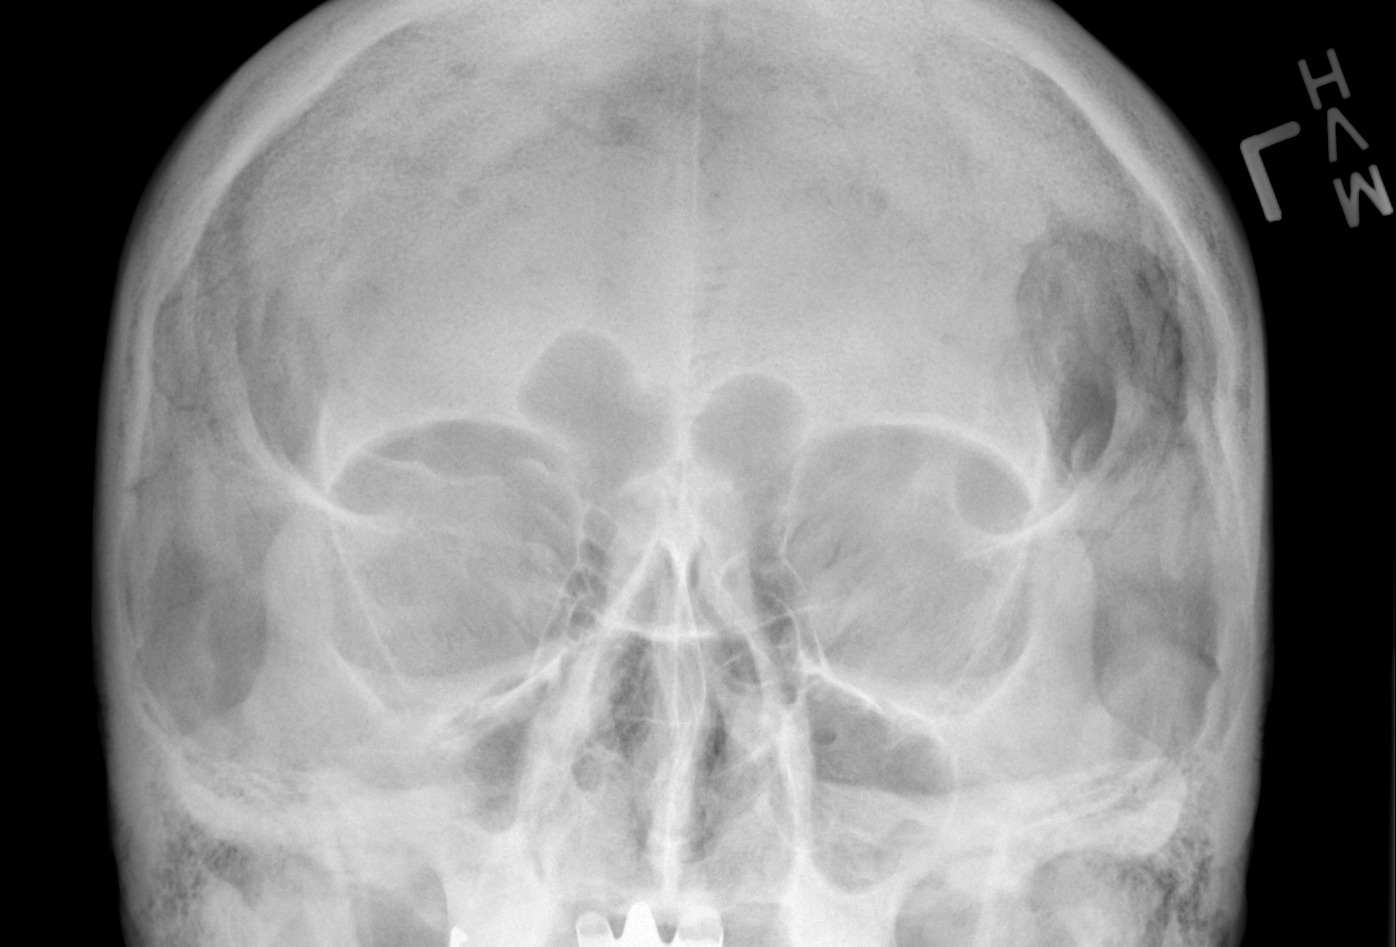

[w waters (2 of 2)]
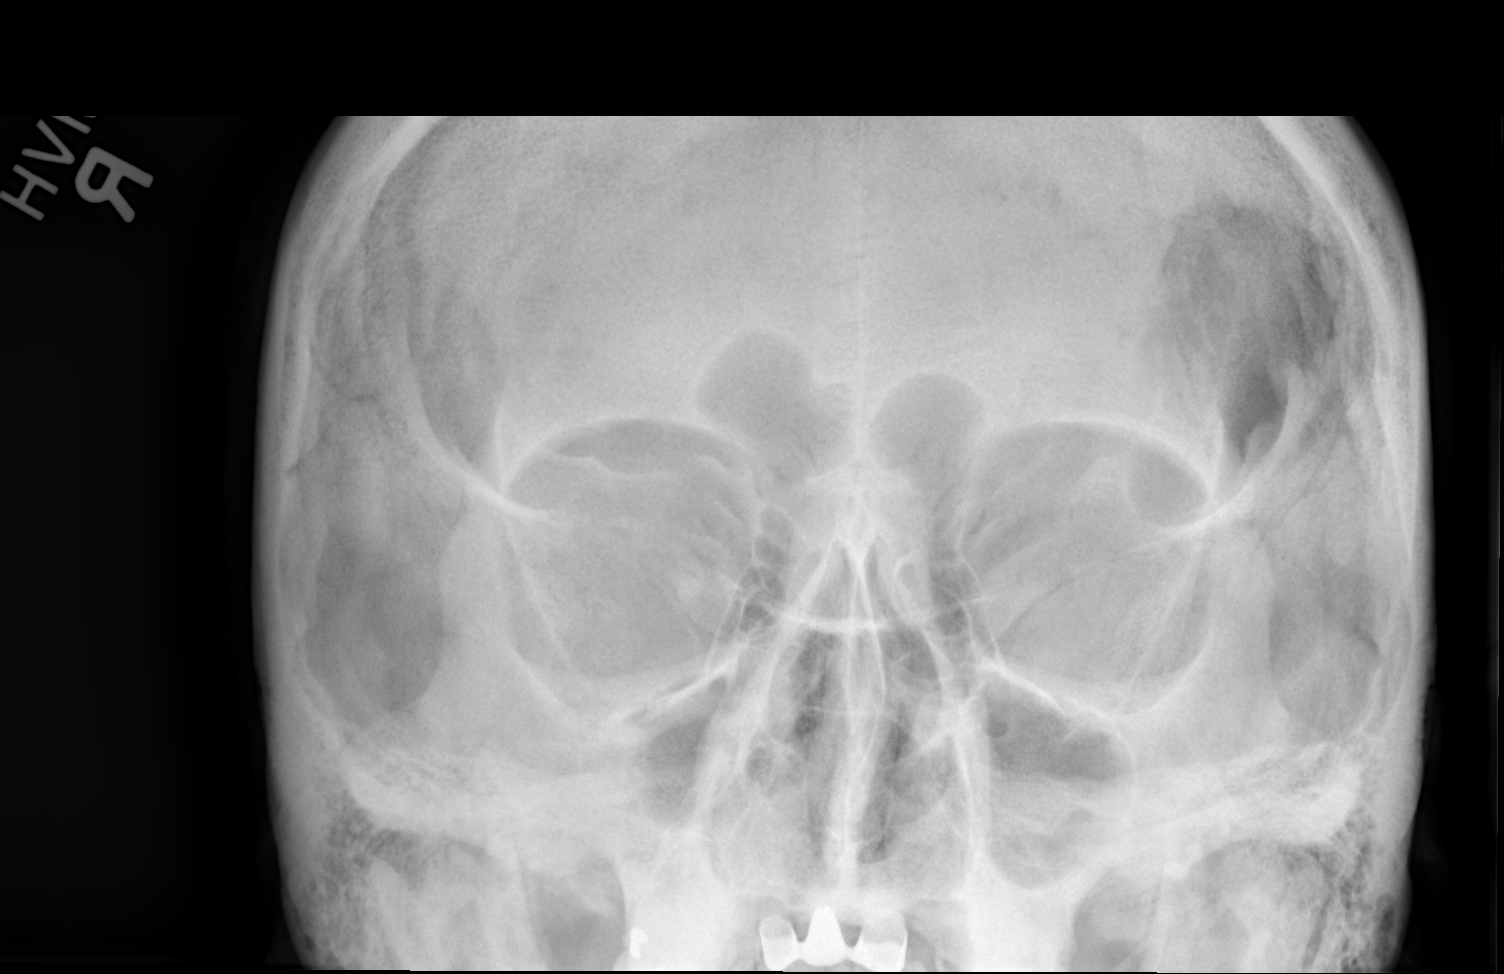

[2 of 2 positions shown; findings below may reference images not displayed]

FINDINGS: There is no evidence of metallic foreign body within the orbits. No
significant bone abnormality identified.
IMPRESSION: No evidence of metallic foreign body within the orbits.

## 2016-02-29 DIAGNOSIS — E785 Hyperlipidemia, unspecified: Secondary | ICD-10-CM | POA: Diagnosis not present

## 2016-02-29 DIAGNOSIS — I10 Essential (primary) hypertension: Secondary | ICD-10-CM | POA: Diagnosis not present

## 2016-02-29 DIAGNOSIS — Z23 Encounter for immunization: Secondary | ICD-10-CM | POA: Diagnosis not present

## 2016-02-29 DIAGNOSIS — E119 Type 2 diabetes mellitus without complications: Secondary | ICD-10-CM | POA: Diagnosis not present

## 2016-03-21 DIAGNOSIS — E119 Type 2 diabetes mellitus without complications: Secondary | ICD-10-CM | POA: Diagnosis not present

## 2016-03-21 DIAGNOSIS — E7800 Pure hypercholesterolemia, unspecified: Secondary | ICD-10-CM | POA: Diagnosis not present

## 2016-03-21 DIAGNOSIS — Z0000 Encounter for general adult medical examination without abnormal findings: Secondary | ICD-10-CM | POA: Diagnosis not present

## 2016-03-21 DIAGNOSIS — Z125 Encounter for screening for malignant neoplasm of prostate: Secondary | ICD-10-CM | POA: Diagnosis not present

## 2016-03-21 DIAGNOSIS — I10 Essential (primary) hypertension: Secondary | ICD-10-CM | POA: Diagnosis not present

## 2016-03-21 DIAGNOSIS — R972 Elevated prostate specific antigen [PSA]: Secondary | ICD-10-CM | POA: Diagnosis not present

## 2016-03-28 DIAGNOSIS — I2510 Atherosclerotic heart disease of native coronary artery without angina pectoris: Secondary | ICD-10-CM | POA: Diagnosis not present

## 2016-03-28 DIAGNOSIS — E1139 Type 2 diabetes mellitus with other diabetic ophthalmic complication: Secondary | ICD-10-CM | POA: Diagnosis not present

## 2016-03-28 DIAGNOSIS — E1165 Type 2 diabetes mellitus with hyperglycemia: Secondary | ICD-10-CM | POA: Diagnosis not present

## 2016-03-28 DIAGNOSIS — E7800 Pure hypercholesterolemia, unspecified: Secondary | ICD-10-CM | POA: Diagnosis not present

## 2016-04-03 DIAGNOSIS — Z0100 Encounter for examination of eyes and vision without abnormal findings: Secondary | ICD-10-CM | POA: Diagnosis not present

## 2016-04-03 DIAGNOSIS — E113211 Type 2 diabetes mellitus with mild nonproliferative diabetic retinopathy with macular edema, right eye: Secondary | ICD-10-CM | POA: Diagnosis not present

## 2016-04-03 DIAGNOSIS — H25812 Combined forms of age-related cataract, left eye: Secondary | ICD-10-CM | POA: Diagnosis not present

## 2016-04-03 DIAGNOSIS — H26491 Other secondary cataract, right eye: Secondary | ICD-10-CM | POA: Diagnosis not present

## 2016-04-04 DIAGNOSIS — E119 Type 2 diabetes mellitus without complications: Secondary | ICD-10-CM | POA: Diagnosis not present

## 2016-05-10 DIAGNOSIS — R490 Dysphonia: Secondary | ICD-10-CM | POA: Diagnosis not present

## 2016-05-12 ENCOUNTER — Encounter: Payer: Self-pay | Admitting: Internal Medicine

## 2016-05-12 ENCOUNTER — Ambulatory Visit (INDEPENDENT_AMBULATORY_CARE_PROVIDER_SITE_OTHER): Payer: Medicare Other | Admitting: Internal Medicine

## 2016-05-12 VITALS — BP 120/68 | HR 59 | Ht 66.5 in | Wt 170.0 lb

## 2016-05-12 DIAGNOSIS — I10 Essential (primary) hypertension: Secondary | ICD-10-CM

## 2016-05-12 DIAGNOSIS — E785 Hyperlipidemia, unspecified: Secondary | ICD-10-CM

## 2016-05-12 DIAGNOSIS — IMO0001 Reserved for inherently not codable concepts without codable children: Secondary | ICD-10-CM

## 2016-05-12 DIAGNOSIS — I2583 Coronary atherosclerosis due to lipid rich plaque: Secondary | ICD-10-CM

## 2016-05-12 DIAGNOSIS — Z794 Long term (current) use of insulin: Secondary | ICD-10-CM

## 2016-05-12 DIAGNOSIS — I2510 Atherosclerotic heart disease of native coronary artery without angina pectoris: Secondary | ICD-10-CM | POA: Diagnosis not present

## 2016-05-12 DIAGNOSIS — E119 Type 2 diabetes mellitus without complications: Secondary | ICD-10-CM

## 2016-05-12 NOTE — Progress Notes (Signed)
OFFICE NOTE  Chief Complaint:  Routine follow-up  Primary Care Physician: Horatio Pel, MD  HPI:  Dustin Mccarthy  Is a 75 year old gentleman who has diabetes type 2, hypertension, dyslipidemia, and obesity. He is on insulin therapy and had a heart catheterization in 2010, which was negative, because of an abnormal stress test. He has also recently had some worsening shortness of breath and difficulty with weight loss. I recommended a metabolic test to further evaluate his shortness of breath. That was performed on March 12, 2012. It showed an RER of 1.06, a peak VO2 of 84% predicted. Heart rate was 87% predicted and the heart rate and VO2 curve showed a good agreement until anaerobic threshold with some flattening of his VO2 curve suggestive of an ischemic response. The VO2, however, is high enough and greater than 60, typically a cutoff for underlying coronary disease. This is a low-risk study and suggests small vessel ischemia. I recommended aerobic exercise and adding l-arginine 3 g p.o. t.i.d. to the diet. He actually did both of these things and reported some marked improvement in his shortness of breath. He did gardening for about 6 months and discontinued it. His exercise is ongoing he is managed to lose 6-8 pounds. He says he feels better and is not bothered by shortness of breath. He been followed closely by Sandi Mariscal (PharmD) at Dr. Peterson [place] office. He reports that his diabetes is fairly well controlled with an A1c of 6.9. He is cholesterol is also at goal.  Dustin Mccarthy returns today for follow-up. Recently he's been having some issues after he returned from a long visit in [place]. He has been noted to have high platelets, and elevated white blood cell count and anemia. He's been seen by hematology and they are fairly convinced he does not have a hematologic malignancy. He is also being seen by infectious diseases for possible exposure related abnormalities in his blood work.  He denies any chest pain or worsening shortness of breath but does have significant fatigue. He denies any fevers, chills or constitutional symptoms. He did have a lipid profile in September which showed total cholesterol of 96, triglycerides 57, HDL 35 and LDL of 50.  Dustin Mccarthy returns today for follow-up. Overall he is doing well denies any chest pain or worsening shortness of breath. Blood pressure was mildly elevated at 144/66 however recheck was 132/64. He says recently his blood pressure may be running slightly higher. I've asked him to follow this at home and talk with his primary care provider when he sees him in follow-up about it. A1c is 6.9 and he maintains on insulin and metformin. Cholesterol was recently checked and at goal. He denies any chest pain or worsening shortness of breath.  05/12/2016  Dustin Mccarthy returns today for follow-up. He denies a chest pain worsening shortness of breath. He has some mild sock line edema. Surprisingly his hemoglobin A1c has gone up despite medicine changes. His weight is actually down 17 pounds since I last saw him. He is on Jardience in addition to [place] and metformin. Blood pressure is well-controlled today.  PMHx:  Past Medical History:  Diagnosis Date  . Arthritis   . CAD (coronary artery disease)    mild (cath 2010)  . Constipation 02/24/2014  . DOE (dyspnea on exertion)   . Herpes simplex conjunctivitis   . Hyperlipidemia   . Hypertension   . Hyponatremia 02/24/2014  . Obesity   . Right shoulder pain 02/24/2014  . Type 2  diabetes mellitus ([place])   . Unspecified deficiency anemia 02/16/2014  . Weight loss 02/16/2014    Past Surgical History:  Procedure Laterality Date  . BACK SURGERY  1988  . CARDIAC CATHETERIZATION  2010   after abnormal stress test (03/2009) - mild coronary disease  . Cardiometabolic Testing  0000000   RER of 1.06, peak VO2 84% predicted; HR 87% predicted  . HERNIA REPAIR    . Skull Fracture Surgery  1945  .  TRANSTHORACIC ECHOCARDIOGRAM  03/2009   EF 45-50%, mild conc LVH, mod septal hypokinesis, mod apical wall hypokinesis; RV systolic function borderline reduced; trace MR/TR    FAMHx:  Family History  Problem Relation Age of Onset  . Hypertension Mother   . Diabetes Mother   . Heart attack Mother   . Stroke Mother   . Parkinson's disease Father   . Heart disease Father   . Diabetes Child   . Cancer Paternal Uncle     throat ca    SOCHx:   reports that he quit smoking about 37 years ago. He has never used smokeless tobacco. He reports that he drinks about 1.5 - 2.0 oz of alcohol per week . He reports that he does not use drugs.  ALLERGIES:  Allergies  Allergen Reactions  . Bee Venom   . Iodine   . Oxycodone Nausea And Vomiting    Cold sweats, n/v and "out of it."    ROS: Pertinent items noted in HPI and remainder of comprehensive ROS otherwise negative.  HOME MEDS: Current Outpatient Prescriptions  Medication Sig Dispense Refill  . ACCU-CHEK AVIVA PLUS test strip 1 strip by Other route 2 (two) times daily. Use 1 strip to check glucose twice a day  0  . acyclovir (ZOVIRAX) 400 MG tablet Take 400 mg by mouth 2 (two) times daily.    Marland Kitchen aspirin 81 MG tablet Take 81 mg by mouth daily.    Marland Kitchen atorvastatin (LIPITOR) 40 MG tablet Take 40 mg by mouth daily.    . empagliflozin (JARDIANCE) 25 MG TABS tablet Take 25 mg by mouth daily.    . Insulin Glargine (TOUJEO SOLOSTAR) 300 UNIT/ML SOPN Inject 12 Units into the skin at bedtime.    . metFORMIN (GLUCOPHAGE) 1000 MG tablet Take 1,000 mg by mouth 2 (two) times daily with a meal.    . pantoprazole (PROTONIX) 40 MG tablet Take 40 mg by mouth daily.    . tamsulosin (FLOMAX) 0.4 MG CAPS capsule Take 0.4 mg by mouth daily.    . Thiamine HCl (VITAMIN B-1 PO) Take by mouth daily.     No current facility-administered medications for this visit.     LABS/IMAGING: No results found for this or any previous visit (from the past 48 hour(s)). No  results found.  VITALS: BP 120/68   Pulse (!) 59   Ht 5' 6.5" (1.689 m)   Wt 170 lb (77.1 kg)   BMI 27.03 kg/m   EXAM: General appearance: alert and no distress Neck: no carotid bruit and no JVD Lungs: clear to auscultation bilaterally Heart: regular rate and rhythm, S1, S2 normal, no murmur, click, rub or gallop Abdomen: soft, non-tender; bowel sounds normal; no masses,  no organomegaly Extremities: extremities normal, atraumatic, no cyanosis or edema Pulses: 2+ and symmetric Skin: Skin color, texture, turgor normal. No rashes or lesions Neurologic: Grossly normal Psych: Mood, affect normal  EKG: Sinus bradycardia with first-degree AV block at 59  ASSESSMENT: 1. Hypertension likely at goal 2. Dyslipidemia  on atorvastatin-at goal 3. Insulin-dependent diabetes 4. Fatigue 5. Mild coronary artery disease by cath in 2010  PLAN: 1.   Dustin Mccarthy is doing well without any chest pain or worsening shortness of breath. Blood pressures at goal. Cholesterol is well controlled. Diabetes is not at goal and he reports for some reason his A1c is higher despite significant weight loss recently. This is difficult to understand. No evidence of active coronary artery disease. Follow-up with me annually or sooner as necessary.  Pixie Casino, MD, [facility] Attending Cardiologist [place] 05/12/2016, 3:17 PM

## 2016-05-12 NOTE — Patient Instructions (Signed)
Your physician wants you to follow-up in: ONE YEAR with Dr. Hilty. You will receive a reminder letter in the mail two months in advance. If you don't receive a letter, please call our office to schedule the follow-up appointment.  

## 2016-05-17 DIAGNOSIS — E113211 Type 2 diabetes mellitus with mild nonproliferative diabetic retinopathy with macular edema, right eye: Secondary | ICD-10-CM | POA: Diagnosis not present

## 2016-05-17 DIAGNOSIS — E113212 Type 2 diabetes mellitus with mild nonproliferative diabetic retinopathy with macular edema, left eye: Secondary | ICD-10-CM | POA: Diagnosis not present

## 2016-05-18 DIAGNOSIS — E119 Type 2 diabetes mellitus without complications: Secondary | ICD-10-CM | POA: Diagnosis not present

## 2016-06-13 DIAGNOSIS — R0989 Other specified symptoms and signs involving the circulatory and respiratory systems: Secondary | ICD-10-CM | POA: Insufficient documentation

## 2016-06-13 DIAGNOSIS — K219 Gastro-esophageal reflux disease without esophagitis: Secondary | ICD-10-CM | POA: Diagnosis not present

## 2016-06-13 DIAGNOSIS — R198 Other specified symptoms and signs involving the digestive system and abdomen: Secondary | ICD-10-CM | POA: Insufficient documentation

## 2016-06-13 DIAGNOSIS — R05 Cough: Secondary | ICD-10-CM | POA: Diagnosis not present

## 2016-06-13 DIAGNOSIS — R490 Dysphonia: Secondary | ICD-10-CM | POA: Diagnosis not present

## 2016-06-13 DIAGNOSIS — R4702 Dysphasia: Secondary | ICD-10-CM | POA: Insufficient documentation

## 2016-06-13 DIAGNOSIS — R1310 Dysphagia, unspecified: Secondary | ICD-10-CM | POA: Diagnosis not present

## 2016-06-13 DIAGNOSIS — F458 Other somatoform disorders: Secondary | ICD-10-CM | POA: Diagnosis not present

## 2016-06-21 ENCOUNTER — Other Ambulatory Visit: Payer: Self-pay | Admitting: Otolaryngology

## 2016-06-21 DIAGNOSIS — R4702 Dysphasia: Secondary | ICD-10-CM

## 2016-06-22 DIAGNOSIS — Z86010 Personal history of colonic polyps: Secondary | ICD-10-CM | POA: Diagnosis not present

## 2016-06-22 DIAGNOSIS — K5730 Diverticulosis of large intestine without perforation or abscess without bleeding: Secondary | ICD-10-CM | POA: Diagnosis not present

## 2016-06-23 ENCOUNTER — Ambulatory Visit
Admission: RE | Admit: 2016-06-23 | Discharge: 2016-06-23 | Disposition: A | Discharge status: Home / Self Care | Payer: Medicare Other | Source: Ambulatory Visit | Attending: Otolaryngology | Admitting: Otolaryngology

## 2016-06-23 DIAGNOSIS — R1310 Dysphagia, unspecified: Secondary | ICD-10-CM | POA: Diagnosis not present

## 2016-06-23 DIAGNOSIS — R4702 Dysphasia: Secondary | ICD-10-CM

## 2016-06-29 DIAGNOSIS — R972 Elevated prostate specific antigen [PSA]: Secondary | ICD-10-CM | POA: Diagnosis not present

## 2016-06-29 DIAGNOSIS — I10 Essential (primary) hypertension: Secondary | ICD-10-CM | POA: Diagnosis not present

## 2016-06-29 DIAGNOSIS — E7800 Pure hypercholesterolemia, unspecified: Secondary | ICD-10-CM | POA: Diagnosis not present

## 2016-06-29 DIAGNOSIS — E1165 Type 2 diabetes mellitus with hyperglycemia: Secondary | ICD-10-CM | POA: Diagnosis not present

## 2016-07-04 DIAGNOSIS — E119 Type 2 diabetes mellitus without complications: Secondary | ICD-10-CM | POA: Diagnosis not present

## 2016-07-04 DIAGNOSIS — E785 Hyperlipidemia, unspecified: Secondary | ICD-10-CM | POA: Diagnosis not present

## 2016-07-04 DIAGNOSIS — I10 Essential (primary) hypertension: Secondary | ICD-10-CM | POA: Diagnosis not present

## 2016-09-13 DIAGNOSIS — E113211 Type 2 diabetes mellitus with mild nonproliferative diabetic retinopathy with macular edema, right eye: Secondary | ICD-10-CM | POA: Diagnosis not present

## 2016-09-25 DIAGNOSIS — N401 Enlarged prostate with lower urinary tract symptoms: Secondary | ICD-10-CM | POA: Diagnosis not present

## 2016-09-25 DIAGNOSIS — N5201 Erectile dysfunction due to arterial insufficiency: Secondary | ICD-10-CM | POA: Diagnosis not present

## 2016-09-25 DIAGNOSIS — R3915 Urgency of urination: Secondary | ICD-10-CM | POA: Diagnosis not present

## 2016-09-25 DIAGNOSIS — R351 Nocturia: Secondary | ICD-10-CM | POA: Diagnosis not present

## 2016-09-26 DIAGNOSIS — E119 Type 2 diabetes mellitus without complications: Secondary | ICD-10-CM | POA: Diagnosis not present

## 2016-09-26 DIAGNOSIS — L570 Actinic keratosis: Secondary | ICD-10-CM | POA: Diagnosis not present

## 2016-09-26 DIAGNOSIS — Z85828 Personal history of other malignant neoplasm of skin: Secondary | ICD-10-CM | POA: Diagnosis not present

## 2016-09-26 DIAGNOSIS — I10 Essential (primary) hypertension: Secondary | ICD-10-CM | POA: Diagnosis not present

## 2016-09-26 DIAGNOSIS — D485 Neoplasm of uncertain behavior of skin: Secondary | ICD-10-CM | POA: Diagnosis not present

## 2016-09-26 DIAGNOSIS — L565 Disseminated superficial actinic porokeratosis (DSAP): Secondary | ICD-10-CM | POA: Diagnosis not present

## 2016-09-26 DIAGNOSIS — L821 Other seborrheic keratosis: Secondary | ICD-10-CM | POA: Diagnosis not present

## 2016-09-26 DIAGNOSIS — L812 Freckles: Secondary | ICD-10-CM | POA: Diagnosis not present

## 2016-10-02 DIAGNOSIS — E119 Type 2 diabetes mellitus without complications: Secondary | ICD-10-CM | POA: Diagnosis not present

## 2016-10-02 DIAGNOSIS — E785 Hyperlipidemia, unspecified: Secondary | ICD-10-CM | POA: Diagnosis not present

## 2016-10-02 DIAGNOSIS — I10 Essential (primary) hypertension: Secondary | ICD-10-CM | POA: Diagnosis not present

## 2016-12-07 DIAGNOSIS — H25812 Combined forms of age-related cataract, left eye: Secondary | ICD-10-CM | POA: Diagnosis not present

## 2016-12-07 DIAGNOSIS — H1789 Other corneal scars and opacities: Secondary | ICD-10-CM | POA: Diagnosis not present

## 2016-12-07 DIAGNOSIS — H26491 Other secondary cataract, right eye: Secondary | ICD-10-CM | POA: Diagnosis not present

## 2017-01-01 DIAGNOSIS — E785 Hyperlipidemia, unspecified: Secondary | ICD-10-CM | POA: Diagnosis not present

## 2017-01-01 DIAGNOSIS — E119 Type 2 diabetes mellitus without complications: Secondary | ICD-10-CM | POA: Diagnosis not present

## 2017-01-01 DIAGNOSIS — I10 Essential (primary) hypertension: Secondary | ICD-10-CM | POA: Diagnosis not present

## 2017-01-02 DIAGNOSIS — E785 Hyperlipidemia, unspecified: Secondary | ICD-10-CM | POA: Diagnosis not present

## 2017-01-02 DIAGNOSIS — E119 Type 2 diabetes mellitus without complications: Secondary | ICD-10-CM | POA: Diagnosis not present

## 2017-01-02 DIAGNOSIS — I10 Essential (primary) hypertension: Secondary | ICD-10-CM | POA: Diagnosis not present

## 2017-02-13 DIAGNOSIS — E113211 Type 2 diabetes mellitus with mild nonproliferative diabetic retinopathy with macular edema, right eye: Secondary | ICD-10-CM | POA: Diagnosis not present

## 2017-03-30 DIAGNOSIS — I10 Essential (primary) hypertension: Secondary | ICD-10-CM | POA: Diagnosis not present

## 2017-03-30 DIAGNOSIS — E7800 Pure hypercholesterolemia, unspecified: Secondary | ICD-10-CM | POA: Diagnosis not present

## 2017-03-30 DIAGNOSIS — E119 Type 2 diabetes mellitus without complications: Secondary | ICD-10-CM | POA: Diagnosis not present

## 2017-03-30 DIAGNOSIS — Z0000 Encounter for general adult medical examination without abnormal findings: Secondary | ICD-10-CM | POA: Diagnosis not present

## 2017-03-30 DIAGNOSIS — E785 Hyperlipidemia, unspecified: Secondary | ICD-10-CM | POA: Diagnosis not present

## 2017-03-30 DIAGNOSIS — Z23 Encounter for immunization: Secondary | ICD-10-CM | POA: Diagnosis not present

## 2017-03-30 DIAGNOSIS — Z125 Encounter for screening for malignant neoplasm of prostate: Secondary | ICD-10-CM | POA: Diagnosis not present

## 2017-04-04 ENCOUNTER — Ambulatory Visit (INDEPENDENT_AMBULATORY_CARE_PROVIDER_SITE_OTHER): Payer: Medicare Other | Admitting: Internal Medicine

## 2017-04-04 ENCOUNTER — Encounter: Payer: Self-pay | Admitting: Internal Medicine

## 2017-04-04 VITALS — BP 134/62 | HR 66 | Ht 66.5 in | Wt 178.0 lb

## 2017-04-04 DIAGNOSIS — E1165 Type 2 diabetes mellitus with hyperglycemia: Secondary | ICD-10-CM | POA: Diagnosis not present

## 2017-04-04 DIAGNOSIS — E119 Type 2 diabetes mellitus without complications: Secondary | ICD-10-CM

## 2017-04-04 DIAGNOSIS — K449 Diaphragmatic hernia without obstruction or gangrene: Secondary | ICD-10-CM | POA: Diagnosis not present

## 2017-04-04 DIAGNOSIS — I6529 Occlusion and stenosis of unspecified carotid artery: Secondary | ICD-10-CM | POA: Diagnosis not present

## 2017-04-04 DIAGNOSIS — I2510 Atherosclerotic heart disease of native coronary artery without angina pectoris: Secondary | ICD-10-CM | POA: Diagnosis not present

## 2017-04-04 DIAGNOSIS — K219 Gastro-esophageal reflux disease without esophagitis: Secondary | ICD-10-CM | POA: Diagnosis not present

## 2017-04-04 DIAGNOSIS — I10 Essential (primary) hypertension: Secondary | ICD-10-CM

## 2017-04-04 DIAGNOSIS — E7800 Pure hypercholesterolemia, unspecified: Secondary | ICD-10-CM | POA: Diagnosis not present

## 2017-04-04 DIAGNOSIS — Z6827 Body mass index (BMI) 27.0-27.9, adult: Secondary | ICD-10-CM | POA: Diagnosis not present

## 2017-04-04 DIAGNOSIS — IMO0001 Reserved for inherently not codable concepts without codable children: Secondary | ICD-10-CM

## 2017-04-04 DIAGNOSIS — N401 Enlarged prostate with lower urinary tract symptoms: Secondary | ICD-10-CM | POA: Diagnosis not present

## 2017-04-04 DIAGNOSIS — R609 Edema, unspecified: Secondary | ICD-10-CM | POA: Diagnosis not present

## 2017-04-04 DIAGNOSIS — I2583 Coronary atherosclerosis due to lipid rich plaque: Secondary | ICD-10-CM

## 2017-04-04 DIAGNOSIS — R490 Dysphonia: Secondary | ICD-10-CM | POA: Diagnosis not present

## 2017-04-04 DIAGNOSIS — Z794 Long term (current) use of insulin: Secondary | ICD-10-CM

## 2017-04-04 DIAGNOSIS — E785 Hyperlipidemia, unspecified: Secondary | ICD-10-CM

## 2017-04-04 NOTE — Progress Notes (Signed)
OFFICE NOTE  Chief Complaint:  No complaints  Primary Care Physician: Deland Pretty, MD  HPI:  Dustin Mccarthy  Is a 76 year old gentleman who has diabetes type 2, hypertension, dyslipidemia, and obesity. He is on insulin therapy and had a heart catheterization in 2010, which was negative, because of an abnormal stress test. He has also recently had some worsening shortness of breath and difficulty with weight loss. I recommended a metabolic test to further evaluate his shortness of breath. That was performed on March 12, 2012. It showed an RER of 1.06, a peak VO2 of 84% predicted. Heart rate was 87% predicted and the heart rate and VO2 curve showed a good agreement until anaerobic threshold with some flattening of his VO2 curve suggestive of an ischemic response. The VO2, however, is high enough and greater than 60, typically a cutoff for underlying coronary disease. This is a low-risk study and suggests small vessel ischemia. I recommended aerobic exercise and adding l-arginine 3 g p.o. t.i.d. to the diet. He actually did both of these things and reported some marked improvement in his shortness of breath. He did gardening for about 6 months and discontinued it. His exercise is ongoing he is managed to lose 6-8 pounds. He says he feels better and is not bothered by shortness of breath. He been followed closely by Sandi Mariscal (PharmD) at Dr. Peterson [place] office. He reports that his diabetes is fairly well controlled with an A1c of 6.9. He is cholesterol is also at goal.  Dustin Mccarthy returns today for follow-up. Recently he's been having some issues after he returned from a long visit in [place]. He has been noted to have high platelets, and elevated white blood cell count and anemia. He's been seen by hematology and they are fairly convinced he does not have a hematologic malignancy. He is also being seen by infectious diseases for possible exposure related abnormalities in his blood work. He denies any  chest pain or worsening shortness of breath but does have significant fatigue. He denies any fevers, chills or constitutional symptoms. He did have a lipid profile in September which showed total cholesterol of 96, triglycerides 57, HDL 35 and LDL of 50.  Dustin Mccarthy returns today for follow-up. Overall he is doing well denies any chest pain or worsening shortness of breath. Blood pressure was mildly elevated at 144/66 however recheck was 132/64. He says recently his blood pressure may be running slightly higher. I've asked him to follow this at home and talk with his primary care provider when he sees him in follow-up about it. A1c is 6.9 and he maintains on insulin and metformin. Cholesterol was recently checked and at goal. He denies any chest pain or worsening shortness of breath.  05/12/2016  Dustin Mccarthy returns today for follow-up. He denies a chest pain worsening shortness of breath. He has some mild sock line edema. Surprisingly his hemoglobin A1c has gone up despite medicine changes. His weight is actually down 17 pounds since I last saw him. He is on Jardience in addition to [place] and metformin. Blood pressure is well-controlled today.  04/04/2017  Dustin Mccarthy returns today for follow-up.  Over the last year he is done fairly well.  He denies any worsening chest pain or shortness of breath.  Hemoglobin A1c recently was 6.8.  His cholesterols been well controlled.  He is on a long-acting insulin in addition to oral medications for his diabetes.  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  He denies any specific activity but does say he feels better when he does some work in the yard.   PMHx:  Past Medical History:  Diagnosis Date  . Arthritis   . CAD (coronary artery disease)    mild (cath 2010)  . Constipation 02/24/2014  . DOE (dyspnea on exertion)   . Herpes simplex conjunctivitis   . Hyperlipidemia   . Hypertension   . Hyponatremia 02/24/2014  . Obesity   . Right  shoulder pain 02/24/2014  . Type 2 diabetes mellitus ([place])   . Unspecified deficiency anemia 02/16/2014  . Weight loss 02/16/2014    Past Surgical History:  Procedure Laterality Date  . BACK SURGERY  1988  . CARDIAC CATHETERIZATION  2010   after abnormal stress test (03/2009) - mild coronary disease  . Cardiometabolic Testing  95/1884   RER of 1.06, peak VO2 84% predicted; HR 87% predicted  . HERNIA REPAIR    . Skull Fracture Surgery  1945  . TRANSTHORACIC ECHOCARDIOGRAM  03/2009   EF 45-50%, mild conc LVH, mod septal hypokinesis, mod apical wall hypokinesis; RV systolic function borderline reduced; trace MR/TR    FAMHx:  Family History  Problem Relation Age of Onset  . Hypertension Mother   . Diabetes Mother   . Heart attack Mother   . Stroke Mother   . Parkinson's disease Father   . Heart disease Father   . Diabetes Child   . Cancer Paternal Uncle        throat ca    SOCHx:   reports that he quit smoking about 38 years ago. he has never used smokeless tobacco. He reports that he drinks about 1.5 - 2.0 oz of alcohol per week. He reports that he does not use drugs.  ALLERGIES:  Allergies  Allergen Reactions  . Bee Venom   . Iodine   . Oxycodone Nausea And Vomiting    Cold sweats, n/v and "out of it."    ROS: Pertinent items noted in HPI and remainder of comprehensive ROS otherwise negative.  HOME MEDS: Current Outpatient Medications  Medication Sig Dispense Refill  . ACCU-CHEK AVIVA PLUS test strip 1 strip by Other route 2 (two) times daily. Use 1 strip to check glucose twice a day  0  . acyclovir (ZOVIRAX) 400 MG tablet Take 400 mg by mouth 2 (two) times daily.    Marland Kitchen aspirin 81 MG tablet Take 81 mg by mouth daily.    Marland Kitchen atorvastatin (LIPITOR) 40 MG tablet Take 40 mg by mouth daily.    . Empagliflozin-Linagliptin (GLYXAMBI) 25-5 MG TABS Take 1 tablet daily by mouth.    . Insulin Glargine (TOUJEO SOLOSTAR) 300 UNIT/ML SOPN Inject 12 Units into the skin at bedtime.      . Insulin Glargine 300 UNIT/ML SOPN Inject 12 Units at bedtime into the skin.    Marland Kitchen losartan (COZAAR) 100 MG tablet Take 1 tablet daily by mouth.    . metFORMIN (GLUCOPHAGE) 1000 MG tablet Take 1,000 mg by mouth 2 (two) times daily with a meal.    . pantoprazole (PROTONIX) 40 MG tablet Take 40 mg by mouth daily.    . tamsulosin (FLOMAX) 0.4 MG CAPS capsule Take 0.4 mg by mouth daily.    . Thiamine HCl (VITAMIN B-1 PO) Take by mouth daily.     No current facility-administered medications for this visit.     LABS/IMAGING: No results found for this or any previous visit (from the past 48 hour(s)). No  results found.  VITALS: BP 134/62   Pulse 66   Ht 5' 6.5" (1.689 m)   Wt 178 lb (80.7 kg)   BMI 28.30 kg/m   EXAM: General appearance: alert and no distress Neck: no carotid bruit and no JVD Lungs: clear to auscultation bilaterally Heart: regular rate and rhythm, S1, S2 normal, no murmur, click, rub or gallop Abdomen: soft, non-tender; bowel sounds normal; no masses,  no organomegaly Extremities: extremities normal, atraumatic, no cyanosis or edema Pulses: 2+ and symmetric Skin: Skin color, texture, turgor normal. No rashes or lesions Neurologic: Grossly normal Psych: Mood, affect normal  EKG: Sinus rhythm at 66-personally reviewed  ASSESSMENT: 1. Hypertension 2. Dyslipidemia on atorvastatin 3. Insulin-dependent diabetes 4. Fatigue 5. Mild coronary artery disease by cath in 2010  PLAN: 1.   Dustin Mccarthy seems to be doing well without any new chest pain or coronary artery disease symptoms.  His diabetes is reasonably well controlled.  His cholesterol is at goal.  Blood pressures well controlled.  I have encouraged more regular exercise.  He says he keeps threatening to start walking again because he felt much better when he walked before but cannot seem to get started.  Follow-up annually sooner as necessary.  Pixie Casino, MD, [facility] Attending Cardiologist [place] Elody Kleinsasser 04/04/2017, 10:13 AM

## 2017-04-04 NOTE — Patient Instructions (Signed)
Your physician wants you to follow-up in: ONE YEAR with Dr. Hilty. You will receive a reminder letter in the mail two months in advance. If you don't receive a letter, please call our office to schedule the follow-up appointment.  

## 2017-05-24 DIAGNOSIS — K641 Second degree hemorrhoids: Secondary | ICD-10-CM | POA: Diagnosis not present

## 2017-05-24 DIAGNOSIS — K625 Hemorrhage of anus and rectum: Secondary | ICD-10-CM | POA: Diagnosis not present

## 2017-06-07 DIAGNOSIS — K641 Second degree hemorrhoids: Secondary | ICD-10-CM | POA: Diagnosis not present

## 2017-06-07 DIAGNOSIS — K625 Hemorrhage of anus and rectum: Secondary | ICD-10-CM | POA: Diagnosis not present

## 2017-06-14 DIAGNOSIS — E113211 Type 2 diabetes mellitus with mild nonproliferative diabetic retinopathy with macular edema, right eye: Secondary | ICD-10-CM | POA: Diagnosis not present

## 2017-06-25 DIAGNOSIS — E785 Hyperlipidemia, unspecified: Secondary | ICD-10-CM | POA: Diagnosis not present

## 2017-06-25 DIAGNOSIS — I10 Essential (primary) hypertension: Secondary | ICD-10-CM | POA: Diagnosis not present

## 2017-06-25 DIAGNOSIS — E119 Type 2 diabetes mellitus without complications: Secondary | ICD-10-CM | POA: Diagnosis not present

## 2017-06-27 DIAGNOSIS — E7800 Pure hypercholesterolemia, unspecified: Secondary | ICD-10-CM | POA: Diagnosis not present

## 2017-06-27 DIAGNOSIS — I10 Essential (primary) hypertension: Secondary | ICD-10-CM | POA: Diagnosis not present

## 2017-06-27 DIAGNOSIS — E1139 Type 2 diabetes mellitus with other diabetic ophthalmic complication: Secondary | ICD-10-CM | POA: Diagnosis not present

## 2017-09-26 DIAGNOSIS — E1139 Type 2 diabetes mellitus with other diabetic ophthalmic complication: Secondary | ICD-10-CM | POA: Diagnosis not present

## 2017-10-01 DIAGNOSIS — D225 Melanocytic nevi of trunk: Secondary | ICD-10-CM | POA: Diagnosis not present

## 2017-10-01 DIAGNOSIS — Z85828 Personal history of other malignant neoplasm of skin: Secondary | ICD-10-CM | POA: Diagnosis not present

## 2017-10-01 DIAGNOSIS — D1801 Hemangioma of skin and subcutaneous tissue: Secondary | ICD-10-CM | POA: Diagnosis not present

## 2017-10-01 DIAGNOSIS — L570 Actinic keratosis: Secondary | ICD-10-CM | POA: Diagnosis not present

## 2017-10-01 DIAGNOSIS — L821 Other seborrheic keratosis: Secondary | ICD-10-CM | POA: Diagnosis not present

## 2017-10-03 DIAGNOSIS — I10 Essential (primary) hypertension: Secondary | ICD-10-CM | POA: Diagnosis not present

## 2017-10-03 DIAGNOSIS — E119 Type 2 diabetes mellitus without complications: Secondary | ICD-10-CM | POA: Diagnosis not present

## 2017-10-03 DIAGNOSIS — E7800 Pure hypercholesterolemia, unspecified: Secondary | ICD-10-CM | POA: Diagnosis not present

## 2017-10-04 DIAGNOSIS — N401 Enlarged prostate with lower urinary tract symptoms: Secondary | ICD-10-CM | POA: Diagnosis not present

## 2017-10-08 DIAGNOSIS — N5201 Erectile dysfunction due to arterial insufficiency: Secondary | ICD-10-CM | POA: Diagnosis not present

## 2017-10-08 DIAGNOSIS — N401 Enlarged prostate with lower urinary tract symptoms: Secondary | ICD-10-CM | POA: Diagnosis not present

## 2017-10-08 DIAGNOSIS — R3911 Hesitancy of micturition: Secondary | ICD-10-CM | POA: Diagnosis not present

## 2017-12-13 DIAGNOSIS — H43812 Vitreous degeneration, left eye: Secondary | ICD-10-CM | POA: Diagnosis not present

## 2017-12-13 DIAGNOSIS — E113211 Type 2 diabetes mellitus with mild nonproliferative diabetic retinopathy with macular edema, right eye: Secondary | ICD-10-CM | POA: Diagnosis not present

## 2018-01-01 DIAGNOSIS — E7800 Pure hypercholesterolemia, unspecified: Secondary | ICD-10-CM | POA: Diagnosis not present

## 2018-01-01 DIAGNOSIS — E119 Type 2 diabetes mellitus without complications: Secondary | ICD-10-CM | POA: Diagnosis not present

## 2018-01-03 DIAGNOSIS — E785 Hyperlipidemia, unspecified: Secondary | ICD-10-CM | POA: Diagnosis not present

## 2018-01-03 DIAGNOSIS — E119 Type 2 diabetes mellitus without complications: Secondary | ICD-10-CM | POA: Diagnosis not present

## 2018-01-03 DIAGNOSIS — I10 Essential (primary) hypertension: Secondary | ICD-10-CM | POA: Diagnosis not present

## 2018-02-11 DIAGNOSIS — Z23 Encounter for immunization: Secondary | ICD-10-CM | POA: Diagnosis not present

## 2018-04-02 ENCOUNTER — Encounter: Payer: Self-pay | Admitting: Internal Medicine

## 2018-04-02 ENCOUNTER — Ambulatory Visit (INDEPENDENT_AMBULATORY_CARE_PROVIDER_SITE_OTHER): Payer: Medicare Other | Admitting: Internal Medicine

## 2018-04-02 VITALS — BP 110/60 | HR 62 | Ht 67.0 in | Wt 167.0 lb

## 2018-04-02 DIAGNOSIS — E785 Hyperlipidemia, unspecified: Secondary | ICD-10-CM

## 2018-04-02 DIAGNOSIS — Z794 Long term (current) use of insulin: Secondary | ICD-10-CM | POA: Diagnosis not present

## 2018-04-02 DIAGNOSIS — E119 Type 2 diabetes mellitus without complications: Secondary | ICD-10-CM | POA: Diagnosis not present

## 2018-04-02 DIAGNOSIS — IMO0001 Reserved for inherently not codable concepts without codable children: Secondary | ICD-10-CM

## 2018-04-02 DIAGNOSIS — I10 Essential (primary) hypertension: Secondary | ICD-10-CM | POA: Diagnosis not present

## 2018-04-02 DIAGNOSIS — I2510 Atherosclerotic heart disease of native coronary artery without angina pectoris: Secondary | ICD-10-CM

## 2018-04-02 DIAGNOSIS — I2583 Coronary atherosclerosis due to lipid rich plaque: Secondary | ICD-10-CM

## 2018-04-02 NOTE — Progress Notes (Signed)
OFFICE NOTE  Chief Complaint:  No complaints  Primary Care Physician: Deland Pretty, MD  HPI:  Dustin Mccarthy  Is a 77 year old gentleman who has diabetes type 2, hypertension, dyslipidemia, and obesity. He is on insulin therapy and had a heart catheterization in 2010, which was negative, because of an abnormal stress test. He has also recently had some worsening shortness of breath and difficulty with weight loss. I recommended a metabolic test to further evaluate his shortness of breath. That was performed on March 12, 2012. It showed an RER of 1.06, a peak VO2 of 84% predicted. Heart rate was 87% predicted and the heart rate and VO2 curve showed a good agreement until anaerobic threshold with some flattening of his VO2 curve suggestive of an ischemic response. The VO2, however, is high enough and greater than 60, typically a cutoff for underlying coronary disease. This is a low-risk study and suggests small vessel ischemia. I recommended aerobic exercise and adding l-arginine 3 g p.o. t.i.d. to the diet. He actually did both of these things and reported some marked improvement in his shortness of breath. He did gardening for about 6 months and discontinued it. His exercise is ongoing he is managed to lose 6-8 pounds. He says he feels better and is not bothered by shortness of breath. He been followed closely by Sandi Mariscal (PharmD) at Dr. Peterson [place] office. He reports that his diabetes is fairly well controlled with an A1c of 6.9. He is cholesterol is also at goal.  Dustin Mccarthy returns today for follow-up. Recently he's been having some issues after he returned from a long visit in [place]. He has been noted to have high platelets, and elevated white blood cell count and anemia. He's been seen by hematology and they are fairly convinced he does not have a hematologic malignancy. He is also being seen by infectious diseases for possible exposure related abnormalities in his blood work. He denies any  chest pain or worsening shortness of breath but does have significant fatigue. He denies any fevers, chills or constitutional symptoms. He did have a lipid profile in September which showed total cholesterol of 96, triglycerides 57, HDL 35 and LDL of 50.  Dustin Mccarthy returns today for follow-up. Overall he is doing well denies any chest pain or worsening shortness of breath. Blood pressure was mildly elevated at 144/66 however recheck was 132/64. He says recently his blood pressure may be running slightly higher. I've asked him to follow this at home and talk with his primary care provider when he sees him in follow-up about it. A1c is 6.9 and he maintains on insulin and metformin. Cholesterol was recently checked and at goal. He denies any chest pain or worsening shortness of breath.  05/12/2016  Dustin Mccarthy returns today for follow-up. He denies a chest pain worsening shortness of breath. He has some mild sock line edema. Surprisingly his hemoglobin A1c has gone up despite medicine changes. His weight is actually down 17 pounds since I last saw him. He is on Jardience in addition to [place] and metformin. Blood pressure is well-controlled today.  04/04/2017  Dustin Mccarthy returns today for follow-up.  Over the last year he is done fairly well.  He denies any worsening chest pain or shortness of breath.  Hemoglobin A1c recently was 6.8.  His cholesterols been well controlled.  He is on a long-acting insulin in addition to oral medications for his diabetes.  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  He denies any specific activity but does say he feels better when he does some work in the yard.  11/30/2017  Dustin Mccarthy returns today for follow-up.  He is done well well over the past year.  Hemoglobin A1c slightly high at 7.1 but hovers around 7.  He is been put on [place] and Toujeo.  The Toujeo does seem to have helped his blood sugars.  He is required less insulin on Glyxambi.  His blood  sugars are now much lower.  Hopefully his more recent A1c will be even lower.  Cholesterol is at goal with most recent lipids this past August showed total cholesterol 103, HDL 34, LDL 48 and triglycerides 106.  He denies any chest pain or shortness of breath.  He is not as physically active as we would like him to be but is committed to starting some exercise and walking.  Weight is actually down about 10 pounds since last year.  PMHx:  Past Medical History:  Diagnosis Date  . Arthritis   . CAD (coronary artery disease)    mild (cath 2010)  . Constipation 02/24/2014  . DOE (dyspnea on exertion)   . Herpes simplex conjunctivitis   . Hyperlipidemia   . Hypertension   . Hyponatremia 02/24/2014  . Obesity   . Right shoulder pain 02/24/2014  . Type 2 diabetes mellitus ([place])   . Unspecified deficiency anemia 02/16/2014  . Weight loss 02/16/2014    Past Surgical History:  Procedure Laterality Date  . BACK SURGERY  1988  . CARDIAC CATHETERIZATION  2010   after abnormal stress test (03/2009) - mild coronary disease  . Cardiometabolic Testing  36/1443   RER of 1.06, peak VO2 84% predicted; HR 87% predicted  . HERNIA REPAIR    . Skull Fracture Surgery  1945  . TRANSTHORACIC ECHOCARDIOGRAM  03/2009   EF 45-50%, mild conc LVH, mod septal hypokinesis, mod apical wall hypokinesis; RV systolic function borderline reduced; trace MR/TR    FAMHx:  Family History  Problem Relation Age of Onset  . Hypertension Mother   . Diabetes Mother   . Heart attack Mother   . Stroke Mother   . Parkinson's disease Father   . Heart disease Father   . Diabetes Child   . Cancer Paternal Uncle        throat ca    SOCHx:   reports that he quit smoking about 39 years ago. He has never used smokeless tobacco. He reports that he drinks about 3.0 - 4.0 standard drinks of alcohol per week. He reports that he does not use drugs.  ALLERGIES:  Allergies  Allergen Reactions  . Bee Venom   . Iodine   . Oxycodone  Nausea And Vomiting    Cold sweats, n/v and "out of it."    ROS: Pertinent items noted in HPI and remainder of comprehensive ROS otherwise negative.  HOME MEDS: Current Outpatient Medications  Medication Sig Dispense Refill  . ACCU-CHEK AVIVA PLUS test strip 1 strip by Other route 2 (two) times daily. Use 1 strip to check glucose twice a day  0  . acyclovir (ZOVIRAX) 400 MG tablet Take 400 mg by mouth 2 (two) times daily.    Marland Kitchen aspirin 81 MG tablet Take 81 mg by mouth daily.    Marland Kitchen atorvastatin (LIPITOR) 40 MG tablet Take 40 mg by mouth daily.    . Empagliflozin-linaGLIPtin (GLYXAMBI) 25-5 MG TABS Take by mouth.    . Insulin Glargine (TOUJEO SOLOSTAR)  300 UNIT/ML SOPN Inject 12 Units into the skin at bedtime.    . Insulin Glargine 300 UNIT/ML SOPN Inject 12 Units at bedtime into the skin.    Marland Kitchen losartan (COZAAR) 100 MG tablet Take 1 tablet daily by mouth.    . metFORMIN (GLUCOPHAGE) 1000 MG tablet Take 1,000 mg by mouth 2 (two) times daily with a meal.    . pantoprazole (PROTONIX) 40 MG tablet Take 40 mg by mouth daily.    . tamsulosin (FLOMAX) 0.4 MG CAPS capsule Take 0.4 mg by mouth daily.    . Thiamine HCl (VITAMIN B-1 PO) Take by mouth daily.     No current facility-administered medications for this visit.     LABS/IMAGING: No results found for this or any previous visit (from the past 48 hour(s)). No results found.  VITALS: BP 110/60 (BP Location: Left Arm, Patient Position: Sitting, Cuff Size: Normal)   Pulse 62   Ht 5\' 7"  (1.702 m)   Wt 167 lb (75.8 kg)   BMI 26.16 kg/m   EXAM: General appearance: alert and no distress Neck: no carotid bruit and no JVD Lungs: clear to auscultation bilaterally Heart: regular rate and rhythm, S1, S2 normal, no murmur, click, rub or gallop Abdomen: soft, non-tender; bowel sounds normal; no masses,  no organomegaly Extremities: extremities normal, atraumatic, no cyanosis or edema Pulses: 2+ and symmetric Skin: Skin color, texture, turgor  normal. No rashes or lesions Neurologic: Grossly normal Psych: Mood, affect normal  EKG: Sinus rhythm first-degree AV block at 62, left axis deviation, nonspecific T wave changes-personally reviewed  ASSESSMENT: 1. Hypertension 2. Dyslipidemia on atorvastatin 3. Insulin-dependent diabetes 4. Fatigue 5. Mild coronary artery disease by cath in 2010  PLAN: 1.   Dustin Mccarthy has had reasonably good control of his diabetes and now is on a new regimen which is also helped him lose weight.  He reports improvement in his fatigue.  He could exercise more regularly.  He did have some mild coronary disease and his cholesterol is at goal as well.  Blood pressure is excellent today.  No changes to his medicines.  Follow-up annually sooner as necessary.  Pixie Casino, MD, [facility], [place] Director of the Advanced Lipid Disorders &  Cardiovascular Risk Reduction Clinic Diplomate of the American Board of Clinical Lipidology Attending Cardiologist  Direct Dial: [phone]  Fax: [phone]  Website:  [email or web address] 04/02/2018, 2:05 PM

## 2018-04-02 NOTE — Patient Instructions (Signed)

## 2018-04-04 DIAGNOSIS — N401 Enlarged prostate with lower urinary tract symptoms: Secondary | ICD-10-CM | POA: Diagnosis not present

## 2018-04-04 DIAGNOSIS — R3912 Poor urinary stream: Secondary | ICD-10-CM | POA: Diagnosis not present

## 2018-04-15 ENCOUNTER — Other Ambulatory Visit: Payer: Self-pay

## 2018-05-06 DIAGNOSIS — Z125 Encounter for screening for malignant neoplasm of prostate: Secondary | ICD-10-CM | POA: Diagnosis not present

## 2018-05-06 DIAGNOSIS — D649 Anemia, unspecified: Secondary | ICD-10-CM | POA: Diagnosis not present

## 2018-05-06 DIAGNOSIS — E119 Type 2 diabetes mellitus without complications: Secondary | ICD-10-CM | POA: Diagnosis not present

## 2018-05-06 DIAGNOSIS — E785 Hyperlipidemia, unspecified: Secondary | ICD-10-CM | POA: Diagnosis not present

## 2018-05-06 DIAGNOSIS — I10 Essential (primary) hypertension: Secondary | ICD-10-CM | POA: Diagnosis not present

## 2018-05-06 DIAGNOSIS — E538 Deficiency of other specified B group vitamins: Secondary | ICD-10-CM | POA: Diagnosis not present

## 2018-05-09 DIAGNOSIS — M7062 Trochanteric bursitis, left hip: Secondary | ICD-10-CM | POA: Diagnosis not present

## 2018-05-09 DIAGNOSIS — E1139 Type 2 diabetes mellitus with other diabetic ophthalmic complication: Secondary | ICD-10-CM | POA: Diagnosis not present

## 2018-05-09 DIAGNOSIS — E7800 Pure hypercholesterolemia, unspecified: Secondary | ICD-10-CM | POA: Diagnosis not present

## 2018-05-09 DIAGNOSIS — I2510 Atherosclerotic heart disease of native coronary artery without angina pectoris: Secondary | ICD-10-CM | POA: Diagnosis not present

## 2018-05-09 DIAGNOSIS — D519 Vitamin B12 deficiency anemia, unspecified: Secondary | ICD-10-CM | POA: Diagnosis not present

## 2018-05-09 DIAGNOSIS — L570 Actinic keratosis: Secondary | ICD-10-CM | POA: Diagnosis not present

## 2018-05-09 DIAGNOSIS — I6529 Occlusion and stenosis of unspecified carotid artery: Secondary | ICD-10-CM | POA: Diagnosis not present

## 2018-05-09 DIAGNOSIS — Z0001 Encounter for general adult medical examination with abnormal findings: Secondary | ICD-10-CM | POA: Diagnosis not present

## 2018-05-09 DIAGNOSIS — I10 Essential (primary) hypertension: Secondary | ICD-10-CM | POA: Diagnosis not present

## 2018-07-03 DIAGNOSIS — E113591 Type 2 diabetes mellitus with proliferative diabetic retinopathy without macular edema, right eye: Secondary | ICD-10-CM | POA: Diagnosis not present

## 2018-07-03 DIAGNOSIS — H35463 Secondary vitreoretinal degeneration, bilateral: Secondary | ICD-10-CM | POA: Diagnosis not present

## 2018-08-30 DIAGNOSIS — E1139 Type 2 diabetes mellitus with other diabetic ophthalmic complication: Secondary | ICD-10-CM | POA: Diagnosis not present

## 2018-08-30 DIAGNOSIS — E119 Type 2 diabetes mellitus without complications: Secondary | ICD-10-CM | POA: Diagnosis not present

## 2018-08-30 DIAGNOSIS — D519 Vitamin B12 deficiency anemia, unspecified: Secondary | ICD-10-CM | POA: Diagnosis not present

## 2018-09-04 DIAGNOSIS — I2510 Atherosclerotic heart disease of native coronary artery without angina pectoris: Secondary | ICD-10-CM | POA: Diagnosis not present

## 2018-09-04 DIAGNOSIS — E7800 Pure hypercholesterolemia, unspecified: Secondary | ICD-10-CM | POA: Diagnosis not present

## 2018-09-04 DIAGNOSIS — E1139 Type 2 diabetes mellitus with other diabetic ophthalmic complication: Secondary | ICD-10-CM | POA: Diagnosis not present

## 2018-09-04 DIAGNOSIS — D519 Vitamin B12 deficiency anemia, unspecified: Secondary | ICD-10-CM | POA: Diagnosis not present

## 2018-09-04 DIAGNOSIS — I10 Essential (primary) hypertension: Secondary | ICD-10-CM | POA: Diagnosis not present

## 2018-10-08 DIAGNOSIS — L565 Disseminated superficial actinic porokeratosis (DSAP): Secondary | ICD-10-CM | POA: Diagnosis not present

## 2018-10-08 DIAGNOSIS — L570 Actinic keratosis: Secondary | ICD-10-CM | POA: Diagnosis not present

## 2018-10-08 DIAGNOSIS — L821 Other seborrheic keratosis: Secondary | ICD-10-CM | POA: Diagnosis not present

## 2018-10-08 DIAGNOSIS — D1801 Hemangioma of skin and subcutaneous tissue: Secondary | ICD-10-CM | POA: Diagnosis not present

## 2018-10-08 DIAGNOSIS — L853 Xerosis cutis: Secondary | ICD-10-CM | POA: Diagnosis not present

## 2018-10-08 DIAGNOSIS — Z85828 Personal history of other malignant neoplasm of skin: Secondary | ICD-10-CM | POA: Diagnosis not present

## 2018-12-13 DIAGNOSIS — I10 Essential (primary) hypertension: Secondary | ICD-10-CM | POA: Diagnosis not present

## 2018-12-13 DIAGNOSIS — E119 Type 2 diabetes mellitus without complications: Secondary | ICD-10-CM | POA: Diagnosis not present

## 2018-12-18 DIAGNOSIS — E1139 Type 2 diabetes mellitus with other diabetic ophthalmic complication: Secondary | ICD-10-CM | POA: Diagnosis not present

## 2018-12-18 DIAGNOSIS — I10 Essential (primary) hypertension: Secondary | ICD-10-CM | POA: Diagnosis not present

## 2018-12-18 DIAGNOSIS — E785 Hyperlipidemia, unspecified: Secondary | ICD-10-CM | POA: Diagnosis not present

## 2018-12-18 DIAGNOSIS — E119 Type 2 diabetes mellitus without complications: Secondary | ICD-10-CM | POA: Diagnosis not present

## 2019-01-06 DIAGNOSIS — Z961 Presence of intraocular lens: Secondary | ICD-10-CM | POA: Diagnosis not present

## 2019-01-06 DIAGNOSIS — E113211 Type 2 diabetes mellitus with mild nonproliferative diabetic retinopathy with macular edema, right eye: Secondary | ICD-10-CM | POA: Diagnosis not present

## 2019-01-06 DIAGNOSIS — H25812 Combined forms of age-related cataract, left eye: Secondary | ICD-10-CM | POA: Diagnosis not present

## 2019-01-06 DIAGNOSIS — H1132 Conjunctival hemorrhage, left eye: Secondary | ICD-10-CM | POA: Diagnosis not present

## 2019-01-06 DIAGNOSIS — H3582 Retinal ischemia: Secondary | ICD-10-CM | POA: Diagnosis not present

## 2019-01-06 DIAGNOSIS — E113292 Type 2 diabetes mellitus with mild nonproliferative diabetic retinopathy without macular edema, left eye: Secondary | ICD-10-CM | POA: Diagnosis not present

## 2019-01-13 DIAGNOSIS — R2689 Other abnormalities of gait and mobility: Secondary | ICD-10-CM | POA: Diagnosis not present

## 2019-01-13 DIAGNOSIS — I10 Essential (primary) hypertension: Secondary | ICD-10-CM | POA: Diagnosis not present

## 2019-01-20 DIAGNOSIS — R42 Dizziness and giddiness: Secondary | ICD-10-CM | POA: Diagnosis not present

## 2019-01-27 DIAGNOSIS — R42 Dizziness and giddiness: Secondary | ICD-10-CM | POA: Diagnosis not present

## 2019-01-28 DIAGNOSIS — E119 Type 2 diabetes mellitus without complications: Secondary | ICD-10-CM | POA: Diagnosis not present

## 2019-01-28 DIAGNOSIS — I10 Essential (primary) hypertension: Secondary | ICD-10-CM | POA: Diagnosis not present

## 2019-01-28 DIAGNOSIS — E7800 Pure hypercholesterolemia, unspecified: Secondary | ICD-10-CM | POA: Diagnosis not present

## 2019-02-10 DIAGNOSIS — I10 Essential (primary) hypertension: Secondary | ICD-10-CM | POA: Diagnosis not present

## 2019-02-10 DIAGNOSIS — Z23 Encounter for immunization: Secondary | ICD-10-CM | POA: Diagnosis not present

## 2019-02-10 DIAGNOSIS — E1139 Type 2 diabetes mellitus with other diabetic ophthalmic complication: Secondary | ICD-10-CM | POA: Diagnosis not present

## 2019-03-18 DIAGNOSIS — I10 Essential (primary) hypertension: Secondary | ICD-10-CM | POA: Diagnosis not present

## 2019-03-18 DIAGNOSIS — E119 Type 2 diabetes mellitus without complications: Secondary | ICD-10-CM | POA: Diagnosis not present

## 2019-03-18 DIAGNOSIS — E7800 Pure hypercholesterolemia, unspecified: Secondary | ICD-10-CM | POA: Diagnosis not present

## 2019-03-20 DIAGNOSIS — E119 Type 2 diabetes mellitus without complications: Secondary | ICD-10-CM | POA: Diagnosis not present

## 2019-03-20 DIAGNOSIS — E875 Hyperkalemia: Secondary | ICD-10-CM | POA: Diagnosis not present

## 2019-03-20 DIAGNOSIS — I10 Essential (primary) hypertension: Secondary | ICD-10-CM | POA: Diagnosis not present

## 2019-03-20 DIAGNOSIS — E785 Hyperlipidemia, unspecified: Secondary | ICD-10-CM | POA: Diagnosis not present

## 2019-03-31 DIAGNOSIS — H52203 Unspecified astigmatism, bilateral: Secondary | ICD-10-CM | POA: Diagnosis not present

## 2019-03-31 DIAGNOSIS — H26491 Other secondary cataract, right eye: Secondary | ICD-10-CM | POA: Diagnosis not present

## 2019-03-31 DIAGNOSIS — H25812 Combined forms of age-related cataract, left eye: Secondary | ICD-10-CM | POA: Diagnosis not present

## 2019-03-31 DIAGNOSIS — E113211 Type 2 diabetes mellitus with mild nonproliferative diabetic retinopathy with macular edema, right eye: Secondary | ICD-10-CM | POA: Diagnosis not present

## 2019-04-02 ENCOUNTER — Ambulatory Visit (INDEPENDENT_AMBULATORY_CARE_PROVIDER_SITE_OTHER): Payer: Medicare Other | Admitting: Internal Medicine

## 2019-04-02 ENCOUNTER — Other Ambulatory Visit: Payer: Self-pay

## 2019-04-02 ENCOUNTER — Encounter: Payer: Self-pay | Admitting: Internal Medicine

## 2019-04-02 VITALS — BP 122/64 | HR 64 | Temp 96.8°F | Ht 66.0 in | Wt 164.0 lb

## 2019-04-02 DIAGNOSIS — I2583 Coronary atherosclerosis due to lipid rich plaque: Secondary | ICD-10-CM

## 2019-04-02 DIAGNOSIS — I2510 Atherosclerotic heart disease of native coronary artery without angina pectoris: Secondary | ICD-10-CM

## 2019-04-02 DIAGNOSIS — I10 Essential (primary) hypertension: Secondary | ICD-10-CM

## 2019-04-02 DIAGNOSIS — E785 Hyperlipidemia, unspecified: Secondary | ICD-10-CM

## 2019-04-02 NOTE — Progress Notes (Signed)
OFFICE NOTE  Chief Complaint:  No complaints  Primary Care Physician: Deland Pretty, MD  HPI:  Dustin Mccarthy  Is a 78 year old gentleman who has diabetes type 2, hypertension, dyslipidemia, and obesity. He is on insulin therapy and had a heart catheterization in 2010, which was negative, because of an abnormal stress test. He has also recently had some worsening shortness of breath and difficulty with weight loss. I recommended a metabolic test to further evaluate his shortness of breath. That was performed on March 12, 2012. It showed an RER of 1.06, a peak VO2 of 84% predicted. Heart rate was 87% predicted and the heart rate and VO2 curve showed a good agreement until anaerobic threshold with some flattening of his VO2 curve suggestive of an ischemic response. The VO2, however, is high enough and greater than 60, typically a cutoff for underlying coronary disease. This is a low-risk study and suggests small vessel ischemia. I recommended aerobic exercise and adding l-arginine 3 g p.o. t.i.d. to the diet. He actually did both of these things and reported some marked improvement in his shortness of breath. He did gardening for about 6 months and discontinued it. His exercise is ongoing he is managed to lose 6-8 pounds. He says he feels better and is not bothered by shortness of breath. He been followed closely by Sandi Mariscal (PharmD) at Dr. [facility] [place] office. He reports that his diabetes is fairly well controlled with an A1c of 6.9. He is cholesterol is also at goal.  Dustin Mccarthy returns today for follow-up. Recently he's been having some issues after he returned from a long visit in [place]. He has been noted to have high platelets, and elevated white blood cell count and anemia. He's been seen by hematology and they are fairly convinced he does not have a hematologic malignancy. He is also being seen by infectious diseases for possible exposure related abnormalities in his blood work. He denies any  chest pain or worsening shortness of breath but does have significant fatigue. He denies any fevers, chills or constitutional symptoms. He did have a lipid profile in September which showed total cholesterol of 96, triglycerides 57, HDL 35 and LDL of 50.  Dustin Mccarthy returns today for follow-up. Overall he is doing well denies any chest pain or worsening shortness of breath. Blood pressure was mildly elevated at 144/66 however recheck was 132/64. He says recently his blood pressure may be running slightly higher. I've asked him to follow this at home and talk with his primary care provider when he sees him in follow-up about it. A1c is 6.9 and he maintains on insulin and metformin. Cholesterol was recently checked and at goal. He denies any chest pain or worsening shortness of breath.  05/12/2016  Dustin Mccarthy returns today for follow-up. He denies a chest pain worsening shortness of breath. He has some mild sock line edema. Surprisingly his hemoglobin A1c has gone up despite medicine changes. His weight is actually down 17 pounds since I last saw him. He is on Jardience in addition to [place] and metformin. Blood pressure is well-controlled today.  04/04/2017  Dustin Mccarthy returns today for follow-up.  Over the last year he is done fairly well.  He denies any worsening chest pain or shortness of breath.  Hemoglobin A1c recently was 6.8.  His cholesterols been well controlled.  He is on a long-acting insulin in addition to oral medications for his diabetes.  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  He denies any specific activity but does say he feels better when he does some work in the yard.  11/30/2017  Dustin Mccarthy returns today for follow-up.  He is done well well over the past year.  Hemoglobin A1c slightly high at 7.1 but hovers around 7.  He is been put on [place] and Toujeo.  The Toujeo does seem to have helped his blood sugars.  He is required less insulin on Glyxambi.  His blood  sugars are now much lower.  Hopefully his more recent A1c will be even lower.  Cholesterol is at goal with most recent lipids this past August showed total cholesterol 103, HDL 34, LDL 48 and triglycerides 106.  He denies any chest pain or shortness of breath.  He is not as physically active as we would like him to be but is committed to starting some exercise and walking.  Weight is actually down about 10 pounds since last year.  04/02/2019  Dustin Mccarthy is seen today in follow-up.  Overall he is doing well and has no complaints.  He denies any chest pain or worsening shortness of breath.  His diabetes is well controlled.  LDL has been at target less than 70.  His blood pressure is also excellent today.  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  PMHx:  Past Medical History:  Diagnosis Date  . Arthritis   . CAD (coronary artery disease)    mild (cath 2010)  . Constipation 02/24/2014  . DOE (dyspnea on exertion)   . Herpes simplex conjunctivitis   . Hyperlipidemia   . Hypertension   . Hyponatremia 02/24/2014  . Obesity   . Right shoulder pain 02/24/2014  . Type 2 diabetes mellitus ([place])   . Unspecified deficiency anemia 02/16/2014  . Weight loss 02/16/2014    Past Surgical History:  Procedure Laterality Date  . BACK SURGERY  1988  . CARDIAC CATHETERIZATION  2010   after abnormal stress test (03/2009) - mild coronary disease  . Cardiometabolic Testing  0000000   RER of 1.06, peak VO2 84% predicted; HR 87% predicted  . HERNIA REPAIR    . Skull Fracture Surgery  1945  . TRANSTHORACIC ECHOCARDIOGRAM  03/2009   EF 45-50%, mild conc LVH, mod septal hypokinesis, mod apical wall hypokinesis; RV systolic function borderline reduced; trace MR/TR    FAMHx:  Family History  Problem Relation Age of Onset  . Hypertension Mother   . Diabetes Mother   . Heart attack Mother   . Stroke Mother   . Parkinson's disease Father   . Heart disease Father   . Diabetes Child    . Cancer Paternal Uncle        throat ca    SOCHx:   reports that he quit smoking about 40 years ago. He has never used smokeless tobacco. He reports current alcohol use of about 3.0 - 4.0 standard drinks of alcohol per week. He reports that he does not use drugs.  ALLERGIES:  Allergies  Allergen Reactions  . Bee Venom   . Iodine   . Oxycodone Nausea And Vomiting    Cold sweats, n/v and "out of it."    ROS: Pertinent items noted in HPI and remainder of comprehensive ROS otherwise negative.  HOME MEDS: Current Outpatient Medications  Medication Sig Dispense Refill  . ACCU-CHEK AVIVA PLUS test strip 1 strip by Other route 2 (two) times daily. Use 1 strip to check glucose twice a day  0  . acyclovir (ZOVIRAX) 400 MG tablet Take 400 mg by mouth 2 (two) times daily.    Marland Kitchen aspirin 81 MG tablet Take 81 mg by mouth daily.    Marland Kitchen atorvastatin (LIPITOR) 40 MG tablet Take 40 mg by mouth daily.    . Dulaglutide (TRULICITY [place]) Inject into the skin.    Marland Kitchen insulin glargine (LANTUS) 100 UNIT/ML injection Inject 8 Units into the skin daily.    Marland Kitchen losartan (COZAAR) 25 MG tablet Take 25 mg by mouth daily.    . metFORMIN (GLUCOPHAGE) 1000 MG tablet Take 1,000 mg by mouth 2 (two) times daily with a meal.    . pantoprazole (PROTONIX) 40 MG tablet Take 40 mg by mouth daily.    . tamsulosin (FLOMAX) 0.4 MG CAPS capsule Take 0.4 mg by mouth daily.    . Thiamine HCl (VITAMIN B-1 PO) Take by mouth daily.     No current facility-administered medications for this visit.     LABS/IMAGING: No results found for this or any previous visit (from the past 48 hour(s)). No results found.  VITALS: BP 122/64 (BP Location: Left Arm, Patient Position: Sitting, Cuff Size: Normal)   Pulse 64   Temp (!) 96.8 F (36 C)   Ht 5\' 6"  (1.676 m)   Wt 164 lb (74.4 kg)   BMI 26.47 kg/m   EXAM: General appearance: alert and no distress Neck: no carotid bruit and no JVD Lungs: clear to auscultation bilaterally Heart:  regular rate and rhythm, S1, S2 normal, no murmur, click, rub or gallop Abdomen: soft, non-tender; bowel sounds normal; no masses,  no organomegaly Extremities: extremities normal, atraumatic, no cyanosis or edema Pulses: 2+ and symmetric Skin: Skin color, texture, turgor normal. No rashes or lesions Neurologic: Grossly normal Psych: Mood, affect normal  EKG: Sinus rhythm first-degree AV block at 64-personally reviewed  ASSESSMENT: 1. Hypertension 2. Dyslipidemia on atorvastatin 3. Insulin-dependent diabetes 4. Fatigue 5. Mild coronary artery disease by cath in 2010  PLAN: 1.   Dustin Mccarthy seems to be doing well with good control over his lipids, cholesterol and blood pressure.  He has no anginal symptoms.  He remains active.  No change in medical therapy at this time.  Follow-up in 6 months or sooner as necessary.  Pixie Casino, MD, [facility], [place] Director of the Advanced Lipid Disorders &  Cardiovascular Risk Reduction Clinic Diplomate of the American Board of Clinical Lipidology Attending Cardiologist  Direct Dial: [phone]  Fax: [phone]  Website:  [email or web address] 04/02/2019, 3:09 PM

## 2019-04-02 NOTE — Patient Instructions (Signed)
Medication Instructions:  No changes *If you need a refill on your cardiac medications before your next appointment, please call your pharmacy*  Lab Work: None ordered If you have labs (blood work) drawn today and your tests are completely normal, you will receive your results only by: Marland Kitchen MyChart Message (if you have MyChart) OR . A paper copy in the mail If you have any lab test that is abnormal or we need to change your treatment, we will call you to review the results.  Testing/Procedures: None ordered  Follow-Up: At [facility], you and your health needs are our priority.  As part of our continuing mission to provide you with exceptional heart care, we have created designated Provider Care Teams.  These Care Teams include your primary Cardiologist (physician) and Advanced Practice Providers (APPs -  Physician Assistants and Nurse Practitioners) who all work together to provide you with the care you need, when you need it.  Your next appointment:   6 months  The format for your next appointment:   In Person  Provider:   K. Mali Hilty, MD

## 2019-04-07 DIAGNOSIS — R3912 Poor urinary stream: Secondary | ICD-10-CM | POA: Diagnosis not present

## 2019-04-07 DIAGNOSIS — N5201 Erectile dysfunction due to arterial insufficiency: Secondary | ICD-10-CM | POA: Diagnosis not present

## 2019-04-07 DIAGNOSIS — N401 Enlarged prostate with lower urinary tract symptoms: Secondary | ICD-10-CM | POA: Diagnosis not present

## 2019-04-10 DIAGNOSIS — E119 Type 2 diabetes mellitus without complications: Secondary | ICD-10-CM | POA: Diagnosis not present

## 2019-04-14 DIAGNOSIS — H353131 Nonexudative age-related macular degeneration, bilateral, early dry stage: Secondary | ICD-10-CM | POA: Diagnosis not present

## 2019-04-14 DIAGNOSIS — H35033 Hypertensive retinopathy, bilateral: Secondary | ICD-10-CM | POA: Diagnosis not present

## 2019-04-14 DIAGNOSIS — E113292 Type 2 diabetes mellitus with mild nonproliferative diabetic retinopathy without macular edema, left eye: Secondary | ICD-10-CM | POA: Diagnosis not present

## 2019-04-14 DIAGNOSIS — H35363 Drusen (degenerative) of macula, bilateral: Secondary | ICD-10-CM | POA: Diagnosis not present

## 2019-04-14 DIAGNOSIS — H3582 Retinal ischemia: Secondary | ICD-10-CM | POA: Diagnosis not present

## 2019-04-14 DIAGNOSIS — E113591 Type 2 diabetes mellitus with proliferative diabetic retinopathy without macular edema, right eye: Secondary | ICD-10-CM | POA: Diagnosis not present

## 2019-04-23 ENCOUNTER — Other Ambulatory Visit: Payer: Self-pay

## 2019-06-09 DIAGNOSIS — E119 Type 2 diabetes mellitus without complications: Secondary | ICD-10-CM | POA: Diagnosis not present

## 2019-06-09 DIAGNOSIS — E785 Hyperlipidemia, unspecified: Secondary | ICD-10-CM | POA: Diagnosis not present

## 2019-06-09 DIAGNOSIS — Z125 Encounter for screening for malignant neoplasm of prostate: Secondary | ICD-10-CM | POA: Diagnosis not present

## 2019-06-09 DIAGNOSIS — I10 Essential (primary) hypertension: Secondary | ICD-10-CM | POA: Diagnosis not present

## 2019-06-11 DIAGNOSIS — E1139 Type 2 diabetes mellitus with other diabetic ophthalmic complication: Secondary | ICD-10-CM | POA: Diagnosis not present

## 2019-06-11 DIAGNOSIS — K219 Gastro-esophageal reflux disease without esophagitis: Secondary | ICD-10-CM | POA: Diagnosis not present

## 2019-06-11 DIAGNOSIS — I2510 Atherosclerotic heart disease of native coronary artery without angina pectoris: Secondary | ICD-10-CM | POA: Diagnosis not present

## 2019-06-11 DIAGNOSIS — D649 Anemia, unspecified: Secondary | ICD-10-CM | POA: Diagnosis not present

## 2019-06-11 DIAGNOSIS — I10 Essential (primary) hypertension: Secondary | ICD-10-CM | POA: Diagnosis not present

## 2019-06-11 DIAGNOSIS — I6529 Occlusion and stenosis of unspecified carotid artery: Secondary | ICD-10-CM | POA: Diagnosis not present

## 2019-06-11 DIAGNOSIS — S61216A Laceration without foreign body of right little finger without damage to nail, initial encounter: Secondary | ICD-10-CM | POA: Diagnosis not present

## 2019-06-11 DIAGNOSIS — N401 Enlarged prostate with lower urinary tract symptoms: Secondary | ICD-10-CM | POA: Diagnosis not present

## 2019-06-11 DIAGNOSIS — D72829 Elevated white blood cell count, unspecified: Secondary | ICD-10-CM | POA: Diagnosis not present

## 2019-06-11 DIAGNOSIS — Z0000 Encounter for general adult medical examination without abnormal findings: Secondary | ICD-10-CM | POA: Diagnosis not present

## 2019-07-04 ENCOUNTER — Inpatient Hospital Stay: Payer: Medicare Other | Attending: Hematology & Oncology | Admitting: Hematology & Oncology

## 2019-07-04 ENCOUNTER — Other Ambulatory Visit: Payer: Self-pay

## 2019-07-04 ENCOUNTER — Inpatient Hospital Stay: Payer: Medicare Other

## 2019-07-04 ENCOUNTER — Encounter: Payer: Self-pay | Admitting: Hematology & Oncology

## 2019-07-04 VITALS — BP 133/81 | HR 70 | Temp 97.1°F | Resp 18 | Ht 67.0 in | Wt 164.0 lb

## 2019-07-04 DIAGNOSIS — D649 Anemia, unspecified: Secondary | ICD-10-CM | POA: Diagnosis not present

## 2019-07-04 DIAGNOSIS — D72823 Leukemoid reaction: Secondary | ICD-10-CM

## 2019-07-04 DIAGNOSIS — D631 Anemia in chronic kidney disease: Secondary | ICD-10-CM

## 2019-07-04 DIAGNOSIS — Z794 Long term (current) use of insulin: Secondary | ICD-10-CM

## 2019-07-04 DIAGNOSIS — Z87891 Personal history of nicotine dependence: Secondary | ICD-10-CM | POA: Diagnosis not present

## 2019-07-04 DIAGNOSIS — Z808 Family history of malignant neoplasm of other organs or systems: Secondary | ICD-10-CM | POA: Diagnosis not present

## 2019-07-04 DIAGNOSIS — E119 Type 2 diabetes mellitus without complications: Secondary | ICD-10-CM

## 2019-07-04 DIAGNOSIS — D500 Iron deficiency anemia secondary to blood loss (chronic): Secondary | ICD-10-CM

## 2019-07-04 DIAGNOSIS — N1831 Chronic kidney disease, stage 3a: Secondary | ICD-10-CM

## 2019-07-04 LAB — RETICULOCYTES
Immature Retic Fract: 10.6 % (ref 2.3–15.9)
RBC.: 3.63 MIL/uL — ABNORMAL LOW (ref 4.22–5.81)
Retic Count, Absolute: 43.2 10*3/uL (ref 19.0–186.0)
Retic Ct Pct: 1.2 % (ref 0.4–3.1)

## 2019-07-04 LAB — CBC WITH DIFFERENTIAL (CANCER CENTER ONLY)
Abs Immature Granulocytes: 0.04 10*3/uL (ref 0.00–0.07)
Basophils Absolute: 0 10*3/uL (ref 0.0–0.1)
Basophils Relative: 0 %
Eosinophils Absolute: 0.1 10*3/uL (ref 0.0–0.5)
Eosinophils Relative: 1 %
HCT: 35.5 % — ABNORMAL LOW (ref 39.0–52.0)
Hemoglobin: 11.4 g/dL — ABNORMAL LOW (ref 13.0–17.0)
Immature Granulocytes: 0 %
Lymphocytes Relative: 27 %
Lymphs Abs: 2.7 10*3/uL (ref 0.7–4.0)
MCH: 31.1 pg (ref 26.0–34.0)
MCHC: 32.1 g/dL (ref 30.0–36.0)
MCV: 97 fL (ref 80.0–100.0)
Monocytes Absolute: 0.8 10*3/uL (ref 0.1–1.0)
Monocytes Relative: 8 %
Neutro Abs: 6.6 10*3/uL (ref 1.7–7.7)
Neutrophils Relative %: 64 %
Platelet Count: 325 10*3/uL (ref 150–400)
RBC: 3.66 MIL/uL — ABNORMAL LOW (ref 4.22–5.81)
RDW: 13.2 % (ref 11.5–15.5)
WBC Count: 10.3 10*3/uL (ref 4.0–10.5)
nRBC: 0 % (ref 0.0–0.2)

## 2019-07-04 LAB — CMP (CANCER CENTER ONLY)
ALT: 13 U/L (ref 0–44)
AST: 15 U/L (ref 15–41)
Albumin: 4.2 g/dL (ref 3.5–5.0)
Alkaline Phosphatase: 57 U/L (ref 38–126)
Anion gap: 10 (ref 5–15)
BUN: 25 mg/dL — ABNORMAL HIGH (ref 8–23)
CO2: 24 mmol/L (ref 22–32)
Calcium: 9.7 mg/dL (ref 8.9–10.3)
Chloride: 101 mmol/L (ref 98–111)
Creatinine: 1.16 mg/dL (ref 0.61–1.24)
GFR, Est AFR Am: >60 mL/min (ref >60)
GFR, Estimated: 60 mL/min — ABNORMAL LOW (ref >60)
Glucose, Bld: 182 mg/dL — ABNORMAL HIGH (ref 70–99)
Potassium: 4.4 mmol/L (ref 3.5–5.1)
Sodium: 135 mmol/L (ref 135–145)
Total Bilirubin: 0.3 mg/dL (ref 0.3–1.2)
Total Protein: 6.8 g/dL (ref 6.5–8.1)

## 2019-07-04 LAB — VITAMIN B12: Vitamin B-12: 532 pg/mL (ref 180–914)

## 2019-07-04 LAB — SAVE SMEAR(SSMR), FOR PROVIDER SLIDE REVIEW

## 2019-07-04 NOTE — Progress Notes (Signed)
Referral MD  Reason for Referral: Anemia-normochromic and normocytic-chronic  Chief Complaint  Patient presents with  . Follow-up  : I am not sure why I am here.  HPI: [name] is a very nice 78-year-old white male.  He comes in with his wife.  It was a whole lot of fun talking to him.  He lived in [place].  He was in [employer].  He did aircraft maintenance.  My dad was a pilot in the [employer] so we had a lot to talk about.  I lived in [place] for quite a while.  We had so much to talk about.  His son went to [employer].  I went to [employer].  He is followed by Dr. Pharr.  As always, Dr. Pharr is very thorough with his assessments and evaluations.  Ms. Wake has longstanding diabetes.  He is on insulin.  He is also on a couple other medications.  He has mild anemia.  Again this is not new.  Going back to 2015, his hemoglobin was 11.3 at that time.  Apparently, recently, lab work was done and his white cell count was 11.1.  Hemoglobin 11.3.  Platelet count.  65.  His MCV was 93.  Again, he has the chronic diabetes.  He has had no obvious bleeding.  I think he has had iron in the past.  He has had no weight loss or weight gain.  He used to smoke but that was a long time ago.  He has no real alcohol use.  There might be occupational exposures since he did aircraft maintenance.  He has had no prior with infections.  He does have skin issues from being out in the sun quite a lot.  There is been no obvious change in bowel or bladder habits.  He has had no issues with the coronavirus.  Overall, I would say his performance status is ECOG 1.    Past Medical History:  Diagnosis Date  . Arthritis   . CAD (coronary artery disease)    mild (cath 2010)  . Constipation 02/24/2014  . DOE (dyspnea on exertion)   . Herpes simplex conjunctivitis   . Hyperlipidemia   . Hypertension   . Hyponatremia 02/24/2014  . Obesity   . Right shoulder pain 02/24/2014  . Type 2  diabetes mellitus (HCC)   . Unspecified deficiency anemia 02/16/2014  . Weight loss 02/16/2014  :  Past Surgical History:  Procedure Laterality Date  . BACK SURGERY  1988  . CARDIAC CATHETERIZATION  2010   after abnormal stress test (03/2009) - mild coronary disease  . Cardiometabolic Testing  02/2012   RER of 1.06, peak VO2 84% predicted; HR 87% predicted  . HERNIA REPAIR    . Skull Fracture Surgery  1945  . TRANSTHORACIC ECHOCARDIOGRAM  03/2009   EF 45-50%, mild conc LVH, mod septal hypokinesis, mod apical wall hypokinesis; RV systolic function borderline reduced; trace MR/TR  :   Current Outpatient Medications:  .  ACCU-CHEK AVIVA PLUS test strip, 1 strip by Other route 2 (two) times daily. Use 1 strip to check glucose twice a day, Disp: , Rfl: 0 .  acyclovir (ZOVIRAX) 400 MG tablet, Take 400 mg by mouth 2 (two) times daily., Disp: , Rfl:  .  aspirin 81 MG tablet, Take 81 mg by mouth daily., Disp: , Rfl:  .  atorvastatin (LIPITOR) 40 MG tablet, Take 40 mg by mouth daily., Disp: , Rfl:  .  Dulaglutide (  TRULICITY [place]), Inject into the skin., Disp: , Rfl:  .  losartan (COZAAR) 25 MG tablet, Take 25 mg by mouth daily., Disp: , Rfl:  .  metFORMIN (GLUCOPHAGE) 1000 MG tablet, Take 1,000 mg by mouth 2 (two) times daily with a meal., Disp: , Rfl:  .  pantoprazole (PROTONIX) 40 MG tablet, Take 40 mg by mouth daily., Disp: , Rfl:  .  tamsulosin (FLOMAX) 0.4 MG CAPS capsule, Take 0.4 mg by mouth daily., Disp: , Rfl:  .  Thiamine HCl (VITAMIN B-1 PO), Take by mouth daily., Disp: , Rfl: :  :  Allergies  Allergen Reactions  . Bee Venom   . Iodine   . Oxycodone Nausea And Vomiting    Cold sweats, n/v and "out of it."  :  Family History  Problem Relation Age of Onset  . Hypertension Mother   . Diabetes Mother   . Heart attack Mother   . Stroke Mother   . Parkinson's disease Father   . Heart disease Father   . Diabetes Child   . Cancer Paternal Uncle        throat ca  :  Social  History   Socioeconomic History  . Marital status: Married    Spouse name: Not on file  . Number of children: 2  . Years of education: 32  . Highest education level: Not on file  Occupational History  . Occupation: retired from [employer]  . Smoking status: Former Smoker    Quit date: 04/02/1979    Years since quitting: 40.2  . Smokeless tobacco: Never Used  Substance and Sexual Activity  . Alcohol use: Yes    Alcohol/week: 3.0 - 4.0 standard drinks    Types: 3 - 4 drink(s) per week  . Drug use: No  . Sexual activity: Not on file  Other Topics Concern  . Not on file  Social History Narrative  . Not on file   Social Determinants of Health   Financial Resource Strain:   . Difficulty of Paying Living Expenses: Not on file  Food Insecurity:   . Worried About Charity fundraiser in the Last Year: Not on file  . Ran Out of Food in the Last Year: Not on file  Transportation Needs:   . Lack of Transportation (Medical): Not on file  . Lack of Transportation (Non-Medical): Not on file  Physical Activity:   . Days of Exercise per Week: Not on file  . Minutes of Exercise per Session: Not on file  Stress:   . Feeling of Stress : Not on file  Social Connections:   . Frequency of Communication with Friends and Family: Not on file  . Frequency of Social Gatherings with Friends and Family: Not on file  . Attends Religious Services: Not on file  . Active Member of Clubs or Organizations: Not on file  . Attends Archivist Meetings: Not on file  . Marital Status: Not on file  Intimate Partner Violence:   . Fear of Current or Ex-Partner: Not on file  . Emotionally Abused: Not on file  . Physically Abused: Not on file  . Sexually Abused: Not on file  :  Review of Systems  Constitutional: Negative.   HENT: Negative.   Eyes: Negative.   Respiratory: Negative.   Cardiovascular: Negative.   Gastrointestinal: Negative.   Genitourinary: Negative.    Musculoskeletal: Negative.   Skin: Negative.   Neurological: Negative.   Endo/Heme/Allergies: Negative.  Psychiatric/Behavioral: Negative.      Exam: This is a well-developed and well-nourished white male in no obvious distress.  Vital signs show temperature of 97.1.  Pulse 70.  Blood pressure 133/81.  Weight is 164 pounds.  Head neck exam shows no ocular or oral lesions.  There is no conjunctival pallor.  He has no scleral icterus.  There is no adenopathy in the neck.  Thyroid is nonpalpable.  Lungs are clear bilaterally.  Cardiac exam regular rate and rhythm with normal S1 and S2.  There are no murmurs, rubs or bruits.  Abdomen is soft.  He has good bowel sounds.  There is no fluid wave.  There is no palpable liver or spleen tip.  Back exam shows laminectomy scar in the lumbar spine.  There is no tenderness over the spine, ribs or hips.  Extremities shows no clubbing, cyanosis or edema.  His good range of motion of his joints.  There is no weakness in his upper or lower extremities.  Skin exam does show some skin changes from sun damage.  He has some scattered actinic keratoses.  There is no ecchymoses or petechia.  Neurological exam is nonfocal.   @IPVITALS@   Recent Labs    07/04/19 1348  WBC 10.3  HGB 11.4*  HCT 35.5*  PLT 325   Recent Labs    07/04/19 1348  NA 135  K 4.4  CL 101  CO2 24  GLUCOSE 182*  BUN 25*  CREATININE 1.16  CALCIUM 9.7    Blood smear review: Normochromic and normocytic population of red blood cells.  I see no microcytic red blood cells.  There are no schistocytes or spherocytes.  I see no target cells.  There is no rouleaux formation.  White blood cells appear minimally increased in number.  He has good maturity of his white blood cells.  There is no hypersegmented polys.  I see no immature myeloid or lymphoid forms.  Platelets are adequate in number and size.  Pathology: None    Assessment and Plan: Mr. Folks is a very nice 78-year-old white  male with mild anemia.  This is chronic.  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  Even though his blood sugars have been well controlled, just the longstanding diabetes can decrease erythropoietin production by the kidneys.  If he is erythropoietin deficient, there really is nothing that we need to do right now.  He does not qualify for ESA administration because his hemoglobin is above 11.  I do not see need for a bone marrow biopsy on him.  I do not think he has myelodysplasia.  I do not believe there is anything with iron deficiency.  I will see nothing that looks like pernicious anemia.  Overall, I really do not believe that we are going to have much of a problem with Mr. Barhorst.  We will monitor his anemia.  I think that the mild leukocytosis is more reactive.  Blood smear does not show anything that looks immature that would suggest a hematologic malignancy or myeloproliferative problem.  We will let him know what our labs show.  Again it will be the erythropoietin level that will be the critical test.  I spent about an hour with he and his wife.  They are both incredibly charming.  It was really nice talking to them about a lot of things.  We actually had a lot in common which was nice this   year.  I gave him a prayer blanket which she was very thankful for.  Once we get his lab results back, we will get him back into the office for follow-up.  

## 2019-07-05 LAB — ERYTHROPOIETIN: Erythropoietin: 8.8 m[IU]/mL (ref 2.6–18.5)

## 2019-07-07 ENCOUNTER — Telehealth: Payer: Self-pay | Admitting: Hematology & Oncology

## 2019-07-07 ENCOUNTER — Encounter: Payer: Self-pay | Admitting: Hematology & Oncology

## 2019-07-07 DIAGNOSIS — D631 Anemia in chronic kidney disease: Secondary | ICD-10-CM

## 2019-07-07 DIAGNOSIS — D500 Iron deficiency anemia secondary to blood loss (chronic): Secondary | ICD-10-CM

## 2019-07-07 HISTORY — DX: Iron deficiency anemia secondary to blood loss (chronic): D50.0

## 2019-07-07 HISTORY — DX: Anemia in chronic kidney disease: D63.1

## 2019-07-07 LAB — FERRITIN: Ferritin: 16 ng/mL — ABNORMAL LOW (ref 24–336)

## 2019-07-07 LAB — IRON AND TIBC
Iron: 58 ug/dL (ref 42–163)
Saturation Ratios: 17 % — ABNORMAL LOW (ref 20–55)
TIBC: 335 ug/dL (ref 202–409)
UIBC: 277 ug/dL (ref 117–376)

## 2019-07-07 NOTE — Addendum Note (Signed)
Addended by: Burney Gauze R on: 07/07/2019 01:48 PM   Modules accepted: Orders

## 2019-07-07 NOTE — Telephone Encounter (Signed)
No los 2/5 

## 2019-07-11 ENCOUNTER — Other Ambulatory Visit: Payer: Self-pay

## 2019-07-11 ENCOUNTER — Inpatient Hospital Stay: Payer: Medicare Other

## 2019-07-11 VITALS — BP 164/67 | HR 63 | Temp 97.1°F | Resp 17

## 2019-07-11 DIAGNOSIS — D500 Iron deficiency anemia secondary to blood loss (chronic): Secondary | ICD-10-CM

## 2019-07-11 DIAGNOSIS — Z87891 Personal history of nicotine dependence: Secondary | ICD-10-CM | POA: Diagnosis not present

## 2019-07-11 DIAGNOSIS — Z808 Family history of malignant neoplasm of other organs or systems: Secondary | ICD-10-CM | POA: Diagnosis not present

## 2019-07-11 DIAGNOSIS — D649 Anemia, unspecified: Secondary | ICD-10-CM | POA: Diagnosis not present

## 2019-07-11 DIAGNOSIS — Z794 Long term (current) use of insulin: Secondary | ICD-10-CM | POA: Diagnosis not present

## 2019-07-11 DIAGNOSIS — E119 Type 2 diabetes mellitus without complications: Secondary | ICD-10-CM | POA: Diagnosis not present

## 2019-07-11 MED ORDER — SODIUM CHLORIDE 0.9 % IV SOLN
750.0000 mg | Freq: Once | INTRAVENOUS | Status: AC
  Administered 2019-07-11: 750 mg via INTRAVENOUS
  Filled 2019-07-11: qty 15

## 2019-07-11 MED ORDER — METHYLPREDNISOLONE SODIUM SUCC 125 MG IJ SOLR
INTRAMUSCULAR | Status: AC
  Filled 2019-07-11: qty 2

## 2019-07-11 MED ORDER — SODIUM CHLORIDE 0.9 % IV SOLN
Freq: Once | INTRAVENOUS | Status: AC
  Filled 2019-07-11: qty 250

## 2019-07-11 MED ORDER — METHYLPREDNISOLONE SODIUM SUCC 125 MG IJ SOLR
125.0000 mg | Freq: Once | INTRAMUSCULAR | Status: AC
  Administered 2019-07-11: 10:00:00 125 mg via INTRAVENOUS

## 2019-07-11 MED ORDER — SODIUM CHLORIDE 0.9 % IV SOLN
40.0000 mg | Freq: Once | INTRAVENOUS | Status: AC
  Administered 2019-07-11: 40 mg via INTRAVENOUS
  Filled 2019-07-11: qty 4

## 2019-07-11 NOTE — Patient Instructions (Signed)

## 2019-08-08 ENCOUNTER — Inpatient Hospital Stay: Payer: Medicare Other | Attending: Hematology & Oncology

## 2019-08-08 ENCOUNTER — Other Ambulatory Visit: Payer: Self-pay

## 2019-08-08 ENCOUNTER — Other Ambulatory Visit: Payer: Self-pay | Admitting: *Deleted

## 2019-08-08 ENCOUNTER — Inpatient Hospital Stay (HOSPITAL_BASED_OUTPATIENT_CLINIC_OR_DEPARTMENT_OTHER): Payer: Medicare Other | Admitting: Hematology & Oncology

## 2019-08-08 VITALS — BP 143/69 | HR 71 | Temp 97.1°F | Resp 18 | Wt 164.0 lb

## 2019-08-08 DIAGNOSIS — D631 Anemia in chronic kidney disease: Secondary | ICD-10-CM

## 2019-08-08 DIAGNOSIS — T732XXA Exhaustion due to exposure, initial encounter: Secondary | ICD-10-CM | POA: Diagnosis not present

## 2019-08-08 DIAGNOSIS — D509 Iron deficiency anemia, unspecified: Secondary | ICD-10-CM | POA: Insufficient documentation

## 2019-08-08 DIAGNOSIS — E1122 Type 2 diabetes mellitus with diabetic chronic kidney disease: Secondary | ICD-10-CM | POA: Diagnosis not present

## 2019-08-08 DIAGNOSIS — N189 Chronic kidney disease, unspecified: Secondary | ICD-10-CM | POA: Diagnosis not present

## 2019-08-08 DIAGNOSIS — D72823 Leukemoid reaction: Secondary | ICD-10-CM

## 2019-08-08 DIAGNOSIS — N1831 Chronic kidney disease, stage 3a: Secondary | ICD-10-CM

## 2019-08-08 DIAGNOSIS — D500 Iron deficiency anemia secondary to blood loss (chronic): Secondary | ICD-10-CM

## 2019-08-08 LAB — CMP (CANCER CENTER ONLY)
ALT: 12 U/L (ref 0–44)
AST: 14 U/L — ABNORMAL LOW (ref 15–41)
Albumin: 4.3 g/dL (ref 3.5–5.0)
Alkaline Phosphatase: 65 U/L (ref 38–126)
Anion gap: 10 (ref 5–15)
BUN: 30 mg/dL — ABNORMAL HIGH (ref 8–23)
CO2: 23 mmol/L (ref 22–32)
Calcium: 9.1 mg/dL (ref 8.9–10.3)
Chloride: 101 mmol/L (ref 98–111)
Creatinine: 1.14 mg/dL (ref 0.61–1.24)
GFR, Est AFR Am: >60 mL/min (ref >60)
GFR, Estimated: >60 mL/min (ref >60)
Glucose, Bld: 246 mg/dL — ABNORMAL HIGH (ref 70–99)
Potassium: 4.3 mmol/L (ref 3.5–5.1)
Sodium: 134 mmol/L — ABNORMAL LOW (ref 135–145)
Total Bilirubin: 0.4 mg/dL (ref 0.3–1.2)
Total Protein: 6.4 g/dL — ABNORMAL LOW (ref 6.5–8.1)

## 2019-08-08 LAB — CBC WITH DIFFERENTIAL (CANCER CENTER ONLY)
Abs Immature Granulocytes: 0.06 10*3/uL (ref 0.00–0.07)
Basophils Absolute: 0 10*3/uL (ref 0.0–0.1)
Basophils Relative: 0 %
Eosinophils Absolute: 0.1 10*3/uL (ref 0.0–0.5)
Eosinophils Relative: 1 %
HCT: 36.7 % — ABNORMAL LOW (ref 39.0–52.0)
Hemoglobin: 11.6 g/dL — ABNORMAL LOW (ref 13.0–17.0)
Immature Granulocytes: 1 %
Lymphocytes Relative: 30 %
Lymphs Abs: 2.8 10*3/uL (ref 0.7–4.0)
MCH: 31 pg (ref 26.0–34.0)
MCHC: 31.6 g/dL (ref 30.0–36.0)
MCV: 98.1 fL (ref 80.0–100.0)
Monocytes Absolute: 0.9 10*3/uL (ref 0.1–1.0)
Monocytes Relative: 9 %
Neutro Abs: 5.6 10*3/uL (ref 1.7–7.7)
Neutrophils Relative %: 59 %
Platelet Count: 306 10*3/uL (ref 150–400)
RBC: 3.74 MIL/uL — ABNORMAL LOW (ref 4.22–5.81)
RDW: 13.6 % (ref 11.5–15.5)
WBC Count: 9.4 10*3/uL (ref 4.0–10.5)
nRBC: 0 % (ref 0.0–0.2)

## 2019-08-08 LAB — RETIC PANEL
Immature Retic Fract: 12 % (ref 2.3–15.9)
RBC.: 3.7 MIL/uL — ABNORMAL LOW (ref 4.22–5.81)
Retic Count, Absolute: 49.6 10*3/uL (ref 19.0–186.0)
Retic Ct Pct: 1.3 % (ref 0.4–3.1)
Reticulocyte Hemoglobin: 35.6 pg (ref >27.9)

## 2019-08-08 LAB — VITAMIN B12: Vitamin B-12: 459 pg/mL (ref 180–914)

## 2019-08-08 NOTE — Progress Notes (Signed)
Hematology and Oncology Follow Up Visit  Dustin Mccarthy [id number] Mar 24, 1941 79 y.o. 08/08/2019   Principle Diagnosis:   Iron deficiency Anemia  Erythropoietin def Anemia  Current Therapy:    IV iron - Injectafer - given on 07/11/2019     Interim History:  Dustin Mccarthy is back for his second office visit.  Unfortunate, he probably is feeling worse than when we first saw him.  When I saw him, it was clear that he had low iron.  His ferritin was 16 with iron saturation of 17%.  In addition, he had a erythropoietin level of only 8.8.  His hemoglobin really has not been that bad.  Going some his hemoglobin I think was 11.4.  We did go ahead and give a dose of IV iron.  He has had this made him feel worse.  He has had a lot of arthralgias and myalgias.  He is tired and fatigued.  He does not have a lot of energy.  I am just surprised that he is not feeling better.  I do not think that his anemia really is the main problem for him.  Again he is mildly anemic at best.  His blood sugar today is 246.  This certainly might be his issue.  I am not sure how his diabetes is being controlled.  I am not sure what his last hemoglobin A1c is.  Have he is on quite a few medications for his diabetes.  I will check a TSH on him.  I will also check a vitamin B12 level on him to see if this may help explain some of his symptoms.  He is eating okay.  He has had some constipation.  He has had no obvious bleeding.  He has had no cough.  He is not noted any swollen joints.  I just feel bad that he is not feeling any better.  I think that a bone marrow biopsy would be incredibly low yield.  I suppose a CAT scan can be done to make sure there is nothing with any obvious malignancy.  I forgot to mention that he did have a vitamin B12 level back in February of 532.  There has been no rashes.  He has a performance status of ECOG 1.  Medications:  Current Outpatient Medications:  .  ACCU-CHEK AVIVA  PLUS test strip, 1 strip by Other route 2 (two) times daily. Use 1 strip to check glucose twice a day, Disp: , Rfl: 0 .  acyclovir (ZOVIRAX) 400 MG tablet, Take 400 mg by mouth 2 (two) times daily., Disp: , Rfl:  .  aspirin 81 MG tablet, Take 81 mg by mouth daily., Disp: , Rfl:  .  atorvastatin (LIPITOR) 40 MG tablet, Take 40 mg by mouth daily., Disp: , Rfl:  .  Dulaglutide (TRULICITY [place]), Inject into the skin., Disp: , Rfl:  .  finasteride (PROSCAR) 5 MG tablet, Take 5 mg by mouth daily., Disp: , Rfl:  .  losartan (COZAAR) 50 MG tablet, Take 50 mg by mouth daily. , Disp: , Rfl:  .  metFORMIN (GLUCOPHAGE) 1000 MG tablet, Take 1,000 mg by mouth 2 (two) times daily with a meal., Disp: , Rfl:  .  pantoprazole (PROTONIX) 40 MG tablet, Take 40 mg by mouth daily., Disp: , Rfl:  .  tamsulosin (FLOMAX) 0.4 MG CAPS capsule, Take 0.4 mg by mouth daily., Disp: , Rfl:  .  Thiamine HCl (VITAMIN B-1 PO), Take by mouth daily., Disp: , Rfl:  Allergies:  Allergies  Allergen Reactions  . Bee Venom   . Iodine   . Oxycodone Nausea And Vomiting    Cold sweats, n/v and "out of it."    Past Medical History, Surgical history, Social history, and Family History were reviewed and updated.  Review of Systems: Review of Systems  Constitutional: Positive for fatigue.  HENT:  Negative.   Eyes: Negative.   Respiratory: Negative.   Cardiovascular: Negative.   Gastrointestinal: Positive for constipation.  Endocrine: Negative.   Genitourinary: Negative.    Musculoskeletal: Positive for arthralgias and myalgias.  Skin: Negative.   Neurological: Positive for light-headedness.  Hematological: Negative.   Psychiatric/Behavioral: Negative.     Physical Exam:  weight is 164 lb (74.4 kg). His temporal temperature is 97.1 F (36.2 C) (abnormal). His blood pressure is 143/69 (abnormal) and his pulse is 71. His respiration is 18 and oxygen saturation is 100%.   Wt Readings from Last 3 Encounters:  08/08/19 164 lb  (74.4 kg)  07/04/19 164 lb (74.4 kg)  04/02/19 164 lb (74.4 kg)    Physical Exam Vitals reviewed.  HENT:     Head: Normocephalic and atraumatic.  Eyes:     Pupils: Pupils are equal, round, and reactive to light.  Cardiovascular:     Rate and Rhythm: Normal rate and regular rhythm.     Heart sounds: Normal heart sounds.  Pulmonary:     Effort: Pulmonary effort is normal.     Breath sounds: Normal breath sounds.  Abdominal:     General: Bowel sounds are normal.     Palpations: Abdomen is soft.  Musculoskeletal:        General: No tenderness or deformity. Normal range of motion.     Cervical back: Normal range of motion.  Lymphadenopathy:     Cervical: No cervical adenopathy.  Skin:    General: Skin is warm and dry.     Findings: No erythema or rash.  Neurological:     Mental Status: He is alert and oriented to person, place, and time.  Psychiatric:        Behavior: Behavior normal.        Thought Content: Thought content normal.        Judgment: Judgment normal.      Lab Results  Component Value Date   WBC 9.4 08/08/2019   HGB 11.6 (L) 08/08/2019   HCT 36.7 (L) 08/08/2019   MCV 98.1 08/08/2019   PLT 306 08/08/2019     Chemistry      Component Value Date/Time   NA 134 (L) 08/08/2019 1029   NA 134 (L) 03/31/2014 1432   K 4.3 08/08/2019 1029   K 4.6 03/31/2014 1432   CL 101 08/08/2019 1029   CO2 23 08/08/2019 1029   CO2 23 03/31/2014 1432   BUN 30 (H) 08/08/2019 1029   BUN 17.2 03/31/2014 1432   CREATININE 1.14 08/08/2019 1029   CREATININE 1.0 03/31/2014 1432      Component Value Date/Time   CALCIUM 9.1 08/08/2019 1029   CALCIUM 9.5 03/31/2014 1432   ALKPHOS 65 08/08/2019 1029   ALKPHOS 63 02/16/2014 1140   AST 14 (L) 08/08/2019 1029   AST 15 02/16/2014 1140   ALT 12 08/08/2019 1029   ALT 12 02/16/2014 1140   BILITOT 0.4 08/08/2019 1029   BILITOT 0.38 02/16/2014 1140      Impression and Plan: Dustin Mccarthy is a 79 year old white male.  He has  diabetes.  He has mild  anemia.  Again, I just have a hard time believing that his problems stem from him being anemic.  With a hemoglobin of 11.6, I would think that he would not feel all that bad.  I just worried that the diabetes is causing problems for him.  His blood sugar today was 246.  I told him that the steroid dose that would get him as a premedication for his iron would not cause his blood sugar to be up at this point.  It is possible that he may have hypothyroidism.  I just do not think that we have to pursue a bone marrow biopsy.  I just do not think we need to do scans on him.  I suppose that the iron could have caused problems for him.  I do realize that some patients can have a reaction to iron.  Usually that reaction is within a day or so.  We will have to see about getting him back.  We will decide about getting him back depending on his labs.  I had to spend about 45 minutes with he and his wife today.  He does have a lot of problems that were active and I wanted to make sure we try to spend time with him to help.  Volanda Napoleon, MD 3/12/202111:16 AM

## 2019-08-11 ENCOUNTER — Encounter: Payer: Self-pay | Admitting: *Deleted

## 2019-08-11 LAB — IRON AND TIBC
Iron: 61 ug/dL (ref 42–163)
Saturation Ratios: 23 % (ref 20–55)
TIBC: 263 ug/dL (ref 202–409)
UIBC: 202 ug/dL (ref 117–376)

## 2019-08-11 LAB — TSH: TSH: 2.454 u[IU]/mL (ref 0.320–4.118)

## 2019-08-11 LAB — FERRITIN: Ferritin: 313 ng/mL (ref 24–336)

## 2019-09-23 DIAGNOSIS — I10 Essential (primary) hypertension: Secondary | ICD-10-CM | POA: Diagnosis not present

## 2019-09-23 DIAGNOSIS — E119 Type 2 diabetes mellitus without complications: Secondary | ICD-10-CM | POA: Diagnosis not present

## 2019-09-25 ENCOUNTER — Ambulatory Visit (INDEPENDENT_AMBULATORY_CARE_PROVIDER_SITE_OTHER): Payer: Medicare Other | Admitting: Physician Assistant

## 2019-09-25 ENCOUNTER — Encounter: Payer: Self-pay | Admitting: Physician Assistant

## 2019-09-25 ENCOUNTER — Other Ambulatory Visit: Payer: Self-pay

## 2019-09-25 VITALS — BP 158/72 | HR 59 | Ht 67.0 in | Wt 168.0 lb

## 2019-09-25 DIAGNOSIS — E1139 Type 2 diabetes mellitus with other diabetic ophthalmic complication: Secondary | ICD-10-CM | POA: Diagnosis not present

## 2019-09-25 DIAGNOSIS — R600 Localized edema: Secondary | ICD-10-CM | POA: Diagnosis not present

## 2019-09-25 DIAGNOSIS — R5383 Other fatigue: Secondary | ICD-10-CM | POA: Diagnosis not present

## 2019-09-25 DIAGNOSIS — E538 Deficiency of other specified B group vitamins: Secondary | ICD-10-CM | POA: Diagnosis not present

## 2019-09-25 DIAGNOSIS — E119 Type 2 diabetes mellitus without complications: Secondary | ICD-10-CM

## 2019-09-25 DIAGNOSIS — E875 Hyperkalemia: Secondary | ICD-10-CM | POA: Diagnosis not present

## 2019-09-25 DIAGNOSIS — D649 Anemia, unspecified: Secondary | ICD-10-CM

## 2019-09-25 DIAGNOSIS — E785 Hyperlipidemia, unspecified: Secondary | ICD-10-CM | POA: Diagnosis not present

## 2019-09-25 DIAGNOSIS — E7800 Pure hypercholesterolemia, unspecified: Secondary | ICD-10-CM | POA: Diagnosis not present

## 2019-09-25 DIAGNOSIS — I2510 Atherosclerotic heart disease of native coronary artery without angina pectoris: Secondary | ICD-10-CM | POA: Diagnosis not present

## 2019-09-25 DIAGNOSIS — I10 Essential (primary) hypertension: Secondary | ICD-10-CM

## 2019-09-25 NOTE — Patient Instructions (Signed)
Medication Instructions:  Continue current medications  *If you need a refill on your cardiac medications before your next appointment, please call your pharmacy*   Lab Work: None Ordered  Testing/Procedures: None Ordered   Follow-Up: At [facility], you and your health needs are our priority.  As part of our continuing mission to provide you with exceptional heart care, we have created designated Provider Care Teams.  These Care Teams include your primary Cardiologist (physician) and Advanced Practice Providers (APPs -  Physician Assistants and Nurse Practitioners) who all work together to provide you with the care you need, when you need it.  We recommend signing up for the patient portal called "MyChart".  Sign up information is provided on this After Visit Summary.  MyChart is used to connect with patients for Virtual Visits (Telemedicine).  Patients are able to view lab/test results, encounter notes, upcoming appointments, etc.  Non-urgent messages can be sent to your provider as well.   To learn more about what you can do with MyChart, go to [email or web address].    Your next appointment:   6 month(s)  The format for your next appointment:   In Person  Provider:   You may see Pixie Casino, MD or one of the following Advanced Practice Providers on your designated Care Team:    Almyra Deforest, PA-C  Fabian Sharp, PA-C or   Roby Lofts, [place]    Other Instructions Keep legs elevated to decrease swelling

## 2019-09-25 NOTE — Progress Notes (Signed)
Cardiology Office Note:    Date:  09/27/2019   ID:  Dustin Mccarthy, DOB [birth year], MRN [id number]  PCP:  Deland Pretty, MD  Cardiologist:  Pixie Casino, MD  Electrophysiologist:  None   Referring MD: Deland Pretty, MD   Chief Complaint  Patient presents with  . Follow-up    seen for Dr. Debara Pickett    History of Present Illness:    Dustin Mccarthy is a 79 y.o. male with a hx of HTN, HLD, and DM II. Patient had a cardiac catheterization following an abnormal exercise treadmill stress test (not a nuclear study) in 2010, this revealed very mild disease with 10% distal left main, 10% entire left circumflex, 20% ostial ramus, and 20% mid RCA lesion.  He had a cardiopulmonary stress test in October 2013 that was low risk study and suggests small vessel ischemia.  Carotid Doppler obtained in September 2015 showed mild disease bilaterally.  He has been followed by Dr. Marin Olp for mild anemia.   Patient presents today along with his wife. He had an appointment with me tomorrow, since he accompanied his wife to her visit today, we decided to move his appointment to today as well. He has been feeling somewhat poorly since starting on the iron infusion therapy about a month ago. He denies any chest pain. He only has dyspnea on more strenuous activity but not with every day activity. On physical exam, he has some lower extremity edema which I recommended more conservative management such as leg elevation and compression stocking. Recent lab work obtained by Dr. Shelia Media suggested elevation of potassium level. Blood pressure is elevated in the office, however normally it is in the 130s at home. If he fails conservative management, then I would consider addition of a hydrochlorothiazide. However I prefer to hold off on this medical therapy unless his symptom is uncontrolled using more conservative management. Otherwise he can follow-up in 6 months.  Past Medical History:  Diagnosis Date  . Arthritis   . CAD  (coronary artery disease)    mild (cath 2010)  . Constipation 02/24/2014  . DOE (dyspnea on exertion)   . 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 Herpes simplex conjunctivitis   . Hyperlipidemia   . Hypertension   . Hyponatremia 02/24/2014  . Iron deficiency anemia due to chronic blood loss 07/07/2019  . Obesity   . Right shoulder pain 02/24/2014  . Type 2 diabetes mellitus ([place])   . Unspecified deficiency anemia 02/16/2014  . Weight loss 02/16/2014    Past Surgical History:  Procedure Laterality Date  . BACK SURGERY  1988  . CARDIAC CATHETERIZATION  2010   after abnormal stress test (03/2009) - mild coronary disease  . Cardiometabolic Testing  0000000   RER of 1.06, peak VO2 84% predicted; HR 87% predicted  . HERNIA REPAIR    . Skull Fracture Surgery  1945  . TRANSTHORACIC ECHOCARDIOGRAM  03/2009   EF 45-50%, mild conc LVH, mod septal hypokinesis, mod apical wall hypokinesis; RV systolic function borderline reduced; trace MR/TR    Current Medications: Current Meds  Medication Sig  . ACCU-CHEK AVIVA PLUS test strip 1 strip by Other route 2 (two) times daily. Use 1 strip to check glucose twice a day  . acyclovir (ZOVIRAX) 400 MG tablet Take 400 mg by mouth 2 (two) times daily.  Marland Kitchen aspirin 81 MG tablet Take 81 mg by mouth daily.  Marland Kitchen atorvastatin (LIPITOR) 40 MG tablet Take 40 mg by mouth daily.  . Dulaglutide (TRULICITY [place])  Inject into the skin.  . finasteride (PROSCAR) 5 MG tablet Take 5 mg by mouth daily.  Marland Kitchen losartan (COZAAR) 50 MG tablet Take 50 mg by mouth daily.   . metFORMIN (GLUCOPHAGE) 1000 MG tablet Take 1,000 mg by mouth 2 (two) times daily with a meal.  . pantoprazole (PROTONIX) 40 MG tablet Take 40 mg by mouth daily.  . tamsulosin (FLOMAX) 0.4 MG CAPS capsule Take 0.4 mg by mouth daily.  . Thiamine HCl (VITAMIN B-1 PO) Take by mouth daily.     Allergies:   Bee venom, Iodine, and Oxycodone   Social History   Socioeconomic History  . Marital status: Married     Spouse name: Not on file  . Number of children: 2  . Years of education: 65  . Highest education level: Not on file  Occupational History  . Occupation: retired from [employer]  . Smoking status: Former Smoker    Quit date: 04/02/1979    Years since quitting: 40.5  . Smokeless tobacco: Never Used  Substance and Sexual Activity  . Alcohol use: Yes    Alcohol/week: 3.0 - 4.0 standard drinks    Types: 3 - 4 drink(s) per week  . Drug use: No  . Sexual activity: Not on file  Other Topics Concern  . Not on file  Social History Narrative  . Not on file   Social Determinants of Health   Financial Resource Strain:   . Difficulty of Paying Living Expenses:   Food Insecurity:   . Worried About Charity fundraiser in the Last Year:   . Arboriculturist in the Last Year:   Transportation Needs:   . Film/video editor (Medical):   Marland Kitchen Lack of Transportation (Non-Medical):   Physical Activity:   . Days of Exercise per Week:   . Minutes of Exercise per Session:   Stress:   . Feeling of Stress :   Social Connections:   . Frequency of Communication with Friends and Family:   . Frequency of Social Gatherings with Friends and Family:   . Attends Religious Services:   . Active Member of Clubs or Organizations:   . Attends Archivist Meetings:   Marland Kitchen Marital Status:      Family History: The patient's family history includes Cancer in his paternal uncle; Diabetes in his child and mother; Heart attack in his mother; Heart disease in his father; Hypertension in his mother; Parkinson's disease in his father; Stroke in his mother.  ROS:   Please see the history of present illness.     All other systems reviewed and are negative.  EKGs/Labs/Other Studies Reviewed:    The following studies were reviewed today:  Cath 04/10/2009    EKG:  EKG is ordered today.  The ekg ordered today demonstrates sinus bradycardia, first-degree AV block, otherwise no  significant ST-T wave changes.  Recent Labs: 08/08/2019: ALT 12; BUN 30; Creatinine 1.14; Hemoglobin 11.6; Platelet Count 306; Potassium 4.3; Sodium 134; TSH 2.454  Recent Lipid Panel No results found for: CHOL, TRIG, HDL, CHOLHDL, VLDL, LDLCALC, LDLDIRECT  Physical Exam:    VS:  BP (!) 158/72   Pulse (!) 59   Ht 5\' 7"  (1.702 m)   Wt 168 lb (76.2 kg)   BMI 26.31 kg/m     Wt Readings from Last 3 Encounters:  09/25/19 168 lb (76.2 kg)  08/08/19 164 lb (74.4 kg)  07/04/19 164 lb (74.4 kg)  GEN:  Well nourished, well developed in no acute distress HEENT: Normal NECK: No JVD; No carotid bruits LYMPHATICS: No lymphadenopathy CARDIAC: RRR, no murmurs, rubs, gallops RESPIRATORY:  Clear to auscultation without rales, wheezing or rhonchi  ABDOMEN: Soft, non-tender, non-distended MUSCULOSKELETAL:  No edema; No deformity  SKIN: Warm and dry NEUROLOGIC:  Alert and oriented x 3 PSYCHIATRIC:  Normal affect   ASSESSMENT:    1. Coronary artery disease involving native coronary artery of native heart without angina pectoris   2. Essential hypertension   3. Hyperlipidemia LDL goal <70   4. Controlled type 2 diabetes mellitus without complication, without long-term current use of insulin ([place])   5. Anemia, unspecified type   6. Leg edema    PLAN:    In order of problems listed above:  1. CAD: Previous cardiac catheterization showed minimal disease at best.  He has shortness of breath with more strenuous activity but not with every day activity.  Continue on current therapy  2. Hypertension: Blood pressure mildly elevated today, however previously in February, his blood pressure was in the 130s.  He has been feeling poorly which likely caused the elevation of the blood pressure.  3. Hyperlipidemia: On Lipitor  4. DM2: Managed by primary care provider  5. Anemia: Recently underwent iron infusion and has been feeling poorly since  6. Leg edema: Recommend leg elevation and  compression stocking.  If fails conservative management, then can consider addition of hydrochlorothiazide.   Medication Adjustments/Labs and Tests Ordered: Current medicines are reviewed at length with the patient today.  Concerns regarding medicines are outlined above.  No orders of the defined types were placed in this encounter.  No orders of the defined types were placed in this encounter.   Patient Instructions  Medication Instructions:  Continue current medications  *If you need a refill on your cardiac medications before your next appointment, please call your pharmacy*   Lab Work: None Ordered  Testing/Procedures: None Ordered   Follow-Up: At [facility], you and your health needs are our priority.  As part of our continuing mission to provide you with exceptional heart care, we have created designated Provider Care Teams.  These Care Teams include your primary Cardiologist (physician) and Advanced Practice Providers (APPs -  Physician Assistants and Nurse Practitioners) who all work together to provide you with the care you need, when you need it.  We recommend signing up for the patient portal called "MyChart".  Sign up information is provided on this After Visit Summary.  MyChart is used to connect with patients for Virtual Visits (Telemedicine).  Patients are able to view lab/test results, encounter notes, upcoming appointments, etc.  Non-urgent messages can be sent to your provider as well.   To learn more about what you can do with MyChart, go to [email or web address].    Your next appointment:   6 month(s)  The format for your next appointment:   In Person  Provider:   You may see Pixie Casino, MD or one of the following Advanced Practice Providers on your designated Care Team:    Almyra Deforest, PA-C  Fabian Sharp, PA-C or   Roby Lofts, [place]    Other Instructions Keep legs elevated to decrease swelling     Signed, Almyra Deforest, [place]  09/27/2019  11:24 PM    [place]

## 2019-09-26 ENCOUNTER — Ambulatory Visit: Payer: Medicare Other | Admitting: Physician Assistant

## 2019-09-27 ENCOUNTER — Encounter: Payer: Self-pay | Admitting: Physician Assistant

## 2019-09-30 DIAGNOSIS — R5382 Chronic fatigue, unspecified: Secondary | ICD-10-CM | POA: Diagnosis not present

## 2019-09-30 DIAGNOSIS — K5900 Constipation, unspecified: Secondary | ICD-10-CM | POA: Diagnosis not present

## 2019-09-30 DIAGNOSIS — D649 Anemia, unspecified: Secondary | ICD-10-CM | POA: Diagnosis not present

## 2019-09-30 DIAGNOSIS — R251 Tremor, unspecified: Secondary | ICD-10-CM | POA: Diagnosis not present

## 2019-09-30 DIAGNOSIS — I10 Essential (primary) hypertension: Secondary | ICD-10-CM | POA: Diagnosis not present

## 2019-09-30 DIAGNOSIS — Z820 Family history of epilepsy and other diseases of the nervous system: Secondary | ICD-10-CM | POA: Diagnosis not present

## 2019-10-01 DIAGNOSIS — H3561 Retinal hemorrhage, right eye: Secondary | ICD-10-CM | POA: Diagnosis not present

## 2019-10-01 DIAGNOSIS — H353131 Nonexudative age-related macular degeneration, bilateral, early dry stage: Secondary | ICD-10-CM | POA: Diagnosis not present

## 2019-10-01 DIAGNOSIS — H35363 Drusen (degenerative) of macula, bilateral: Secondary | ICD-10-CM | POA: Diagnosis not present

## 2019-10-01 DIAGNOSIS — H35033 Hypertensive retinopathy, bilateral: Secondary | ICD-10-CM | POA: Diagnosis not present

## 2019-10-09 ENCOUNTER — Ambulatory Visit: Payer: Medicare Other | Admitting: Internal Medicine

## 2019-10-21 ENCOUNTER — Encounter: Payer: Self-pay | Admitting: Internal Medicine

## 2019-10-30 DIAGNOSIS — Z794 Long term (current) use of insulin: Secondary | ICD-10-CM | POA: Diagnosis not present

## 2019-10-30 DIAGNOSIS — I2510 Atherosclerotic heart disease of native coronary artery without angina pectoris: Secondary | ICD-10-CM | POA: Diagnosis not present

## 2019-10-30 DIAGNOSIS — R0600 Dyspnea, unspecified: Secondary | ICD-10-CM | POA: Diagnosis not present

## 2019-10-30 DIAGNOSIS — D631 Anemia in chronic kidney disease: Secondary | ICD-10-CM | POA: Diagnosis not present

## 2019-10-30 DIAGNOSIS — E119 Type 2 diabetes mellitus without complications: Secondary | ICD-10-CM | POA: Diagnosis not present

## 2019-10-30 DIAGNOSIS — E785 Hyperlipidemia, unspecified: Secondary | ICD-10-CM | POA: Diagnosis not present

## 2019-10-30 DIAGNOSIS — I10 Essential (primary) hypertension: Secondary | ICD-10-CM | POA: Diagnosis not present

## 2019-11-28 DIAGNOSIS — R5383 Other fatigue: Secondary | ICD-10-CM | POA: Diagnosis not present

## 2019-11-28 DIAGNOSIS — E119 Type 2 diabetes mellitus without complications: Secondary | ICD-10-CM | POA: Diagnosis not present

## 2019-11-28 DIAGNOSIS — I2510 Atherosclerotic heart disease of native coronary artery without angina pectoris: Secondary | ICD-10-CM | POA: Diagnosis not present

## 2019-11-28 DIAGNOSIS — D649 Anemia, unspecified: Secondary | ICD-10-CM | POA: Diagnosis not present

## 2019-11-28 DIAGNOSIS — R634 Abnormal weight loss: Secondary | ICD-10-CM | POA: Diagnosis not present

## 2019-12-02 DIAGNOSIS — D649 Anemia, unspecified: Secondary | ICD-10-CM | POA: Diagnosis not present

## 2019-12-02 DIAGNOSIS — R5383 Other fatigue: Secondary | ICD-10-CM | POA: Diagnosis not present

## 2019-12-02 DIAGNOSIS — R634 Abnormal weight loss: Secondary | ICD-10-CM | POA: Diagnosis not present

## 2019-12-02 DIAGNOSIS — E119 Type 2 diabetes mellitus without complications: Secondary | ICD-10-CM | POA: Diagnosis not present

## 2019-12-02 DIAGNOSIS — I2510 Atherosclerotic heart disease of native coronary artery without angina pectoris: Secondary | ICD-10-CM | POA: Diagnosis not present

## 2019-12-11 DIAGNOSIS — N281 Cyst of kidney, acquired: Secondary | ICD-10-CM | POA: Diagnosis not present

## 2019-12-11 DIAGNOSIS — R911 Solitary pulmonary nodule: Secondary | ICD-10-CM | POA: Diagnosis not present

## 2019-12-11 DIAGNOSIS — R634 Abnormal weight loss: Secondary | ICD-10-CM | POA: Diagnosis not present

## 2019-12-11 DIAGNOSIS — R5383 Other fatigue: Secondary | ICD-10-CM | POA: Diagnosis not present

## 2019-12-11 DIAGNOSIS — N400 Enlarged prostate without lower urinary tract symptoms: Secondary | ICD-10-CM | POA: Diagnosis not present

## 2019-12-11 DIAGNOSIS — D649 Anemia, unspecified: Secondary | ICD-10-CM | POA: Diagnosis not present

## 2019-12-11 DIAGNOSIS — N200 Calculus of kidney: Secondary | ICD-10-CM | POA: Diagnosis not present

## 2019-12-15 ENCOUNTER — Ambulatory Visit: Payer: Medicare Other | Admitting: Neurology

## 2019-12-18 ENCOUNTER — Ambulatory Visit: Payer: Medicare Other | Admitting: Internal Medicine

## 2019-12-25 DIAGNOSIS — D649 Anemia, unspecified: Secondary | ICD-10-CM | POA: Diagnosis not present

## 2019-12-30 DIAGNOSIS — I340 Nonrheumatic mitral (valve) insufficiency: Secondary | ICD-10-CM | POA: Diagnosis not present

## 2019-12-30 DIAGNOSIS — I361 Nonrheumatic tricuspid (valve) insufficiency: Secondary | ICD-10-CM | POA: Diagnosis not present

## 2020-01-14 DIAGNOSIS — I2510 Atherosclerotic heart disease of native coronary artery without angina pectoris: Secondary | ICD-10-CM | POA: Diagnosis not present

## 2020-01-22 ENCOUNTER — Encounter: Payer: Self-pay | Admitting: Internal Medicine

## 2020-01-22 DIAGNOSIS — R5383 Other fatigue: Secondary | ICD-10-CM | POA: Diagnosis not present

## 2020-01-22 DIAGNOSIS — D508 Other iron deficiency anemias: Secondary | ICD-10-CM | POA: Diagnosis not present

## 2020-01-22 DIAGNOSIS — R0602 Shortness of breath: Secondary | ICD-10-CM | POA: Diagnosis not present

## 2020-01-22 DIAGNOSIS — I208 Other forms of angina pectoris: Secondary | ICD-10-CM | POA: Diagnosis not present

## 2020-02-05 DIAGNOSIS — M25831 Other specified joint disorders, right wrist: Secondary | ICD-10-CM | POA: Diagnosis not present

## 2020-02-05 DIAGNOSIS — M25539 Pain in unspecified wrist: Secondary | ICD-10-CM | POA: Diagnosis not present

## 2020-02-05 DIAGNOSIS — M25531 Pain in right wrist: Secondary | ICD-10-CM | POA: Diagnosis not present

## 2020-02-09 DIAGNOSIS — I2510 Atherosclerotic heart disease of native coronary artery without angina pectoris: Secondary | ICD-10-CM | POA: Diagnosis not present

## 2020-02-11 DIAGNOSIS — I208 Other forms of angina pectoris: Secondary | ICD-10-CM | POA: Diagnosis not present

## 2020-02-11 DIAGNOSIS — R5383 Other fatigue: Secondary | ICD-10-CM | POA: Diagnosis not present

## 2020-02-11 DIAGNOSIS — I2510 Atherosclerotic heart disease of native coronary artery without angina pectoris: Secondary | ICD-10-CM | POA: Diagnosis not present

## 2020-02-11 DIAGNOSIS — R0600 Dyspnea, unspecified: Secondary | ICD-10-CM | POA: Diagnosis not present

## 2020-02-17 DIAGNOSIS — Z20822 Contact with and (suspected) exposure to covid-19: Secondary | ICD-10-CM | POA: Diagnosis not present

## 2020-02-17 DIAGNOSIS — Z01818 Encounter for other preprocedural examination: Secondary | ICD-10-CM | POA: Diagnosis not present

## 2020-02-20 DIAGNOSIS — Z794 Long term (current) use of insulin: Secondary | ICD-10-CM | POA: Diagnosis not present

## 2020-02-20 DIAGNOSIS — Z7982 Long term (current) use of aspirin: Secondary | ICD-10-CM | POA: Diagnosis not present

## 2020-02-20 DIAGNOSIS — I10 Essential (primary) hypertension: Secondary | ICD-10-CM | POA: Diagnosis not present

## 2020-02-20 DIAGNOSIS — F17210 Nicotine dependence, cigarettes, uncomplicated: Secondary | ICD-10-CM | POA: Diagnosis not present

## 2020-02-20 DIAGNOSIS — R5383 Other fatigue: Secondary | ICD-10-CM | POA: Diagnosis not present

## 2020-02-20 DIAGNOSIS — Z823 Family history of stroke: Secondary | ICD-10-CM | POA: Diagnosis not present

## 2020-02-20 DIAGNOSIS — I2510 Atherosclerotic heart disease of native coronary artery without angina pectoris: Secondary | ICD-10-CM | POA: Diagnosis not present

## 2020-02-20 DIAGNOSIS — R0600 Dyspnea, unspecified: Secondary | ICD-10-CM | POA: Diagnosis not present

## 2020-02-20 DIAGNOSIS — E119 Type 2 diabetes mellitus without complications: Secondary | ICD-10-CM | POA: Diagnosis not present

## 2020-02-20 DIAGNOSIS — I2584 Coronary atherosclerosis due to calcified coronary lesion: Secondary | ICD-10-CM | POA: Diagnosis not present

## 2020-02-20 DIAGNOSIS — Z79899 Other long term (current) drug therapy: Secondary | ICD-10-CM | POA: Diagnosis not present

## 2020-02-20 DIAGNOSIS — D649 Anemia, unspecified: Secondary | ICD-10-CM | POA: Diagnosis not present

## 2020-03-26 ENCOUNTER — Ambulatory Visit (INDEPENDENT_AMBULATORY_CARE_PROVIDER_SITE_OTHER): Payer: Medicare Other | Admitting: Nurse Practitioner

## 2020-03-26 ENCOUNTER — Other Ambulatory Visit (INDEPENDENT_AMBULATORY_CARE_PROVIDER_SITE_OTHER): Payer: Medicare Other

## 2020-03-26 ENCOUNTER — Encounter: Payer: Self-pay | Admitting: Nurse Practitioner

## 2020-03-26 DIAGNOSIS — I2510 Atherosclerotic heart disease of native coronary artery without angina pectoris: Secondary | ICD-10-CM | POA: Diagnosis not present

## 2020-03-26 DIAGNOSIS — D649 Anemia, unspecified: Secondary | ICD-10-CM | POA: Diagnosis not present

## 2020-03-26 DIAGNOSIS — R1310 Dysphagia, unspecified: Secondary | ICD-10-CM | POA: Diagnosis not present

## 2020-03-26 DIAGNOSIS — R079 Chest pain, unspecified: Secondary | ICD-10-CM | POA: Diagnosis not present

## 2020-03-26 LAB — IBC + FERRITIN
Ferritin: 105.5 ng/mL (ref 22.0–322.0)
Iron: 43 ug/dL (ref 42–165)
Saturation Ratios: 13.1 % — ABNORMAL LOW (ref 20.0–50.0)
Transferrin: 235 mg/dL (ref 212.0–360.0)

## 2020-03-26 LAB — CBC
HCT: 36.3 % — ABNORMAL LOW (ref 39.0–52.0)
Hemoglobin: 12 g/dL — ABNORMAL LOW (ref 13.0–17.0)
MCHC: 33.2 g/dL (ref 30.0–36.0)
MCV: 96.1 fl (ref 78.0–100.0)
Platelets: 316 10*3/uL (ref 150.0–400.0)
RBC: 3.78 Mil/uL — ABNORMAL LOW (ref 4.22–5.81)
RDW: 13.9 % (ref 11.5–15.5)
WBC: 10.6 10*3/uL — ABNORMAL HIGH (ref 4.0–10.5)

## 2020-03-26 NOTE — Patient Instructions (Addendum)
If you are age 79 or older, your body mass index should be between 23-30. Your Body mass index is 26.08 kg/m. If this is out of the aforementioned range listed, please consider follow up with your Primary Care Provider.  If you are age 5 or younger, your body mass index should be between 19-25. Your Body mass index is 26.08 kg/m. If this is out of the aformentioned range listed, please consider follow up with your Primary Care Provider.   Your provider has requested that you go to the basement level for lab work before leaving today. Press "B" on the elevator. The lab is located at the first door on the left as you exit the elevator.  You have been scheduled for an endoscopy. Please follow written instructions given to you at your visit today. If you use inhalers (even only as needed), please bring them with you on the day of your procedure.  Due to recent changes in healthcare laws, you may see the results of your imaging and laboratory studies on MyChart before your provider has had a chance to review them.  We understand that in some cases there may be results that are confusing or concerning to you. Not all laboratory results come back in the same time frame and the provider may be waiting for multiple results in order to interpret others.  Please give [place] 48 hours in order for your provider to thoroughly review all the results before contacting the office for clarification of your results.   We will obtain previous colonoscopy from Dr Amedeo Plenty at [place].  Thank you for entrusting me with your care and choosing [facility].  Tye Savoy, NP

## 2020-03-26 NOTE — Progress Notes (Signed)
+    ASSESSMENT AND PLAN    # 79 yo old male with pill dysphagia / postprandial chest discomfort despite PPI. Saw Cardiology over the summer in [place] . Evaluation reportedly negative.  --Will arrange for EGD for further evaluation of symptoms. The risks and benefits of EGD were discussed and the patient agrees to proceed.   # Chronic anemia. Hx of iron deficiency anemia and erythropoietin deficiency. IV Iron with Dr. Marin Olp in March 2021. Reportedly normal Cologuard over the summer in [place].  --Reports colonoscopy 5 years ago with [facility], told follow up colonoscopy not needed. Will request report.  --Further evaluation at time of EGD.  --No recent labs. Will check CBC. Obtain iron studies though he is taking oral iron.    HISTORY OF PRESENT ILLNESS     Primary Gastroenterologist :   New- Thornton Park, MD  Chief Complaint :problems swallowing pills.   Dustin Mccarthy is a 79 y.o. male with PMH / [place] significant for,  but not necessarily limited to:  diabetes, anemia, CAD, HTN, hyperlipidemia,  Patient is new to the practice., referred by PCP for dysphagia  He has been having problems swallowing some of his pills since June. Pills are not new and he swallowed them fine until around June.  A year ago patient was having problems with food getting stuck in his esophagus but says that problem went away.  Esophogram in 2018  was negative.   Over the summer while on the way to visit daughter in [place] patient began having chest discomfort and shortness of breath with minimal exertion.Clarnce Flock Cardiologist in [place]. Sounds like he had a heart cath and ultimately symptoms not felt to be cardiac related. Marland Kitchen He has a history of anemia and sent to Hematologist while in [place]. He has been on oral iron since I don't have any of these records. He was started on oral iron but it constipated him so he decreased dose to QOD. Also  while in [place] someone ordered a Cologuard which was reportedly negative. Locally his  anemia has ben followed by Dr. Marin Olp though patient not seen since March when he required a dose of IV iron. No NSAID use other than daily baby asa. No overt GI bleeding but stools black on iron.    Patient continues to have daily episodes of chest discomfort, especially after eating. No regurgitation. . Since bing on Pantoprazole for several months he feels like the chest pressure occurs less often but he still gets daily episodes.    Previous Endoscopic Evaluations / Pertinent Studies:   Colonoscopy - Dr. Amedeo Plenty approx 5 years ago and told he didn't need another one.   Barium Swallow  In 2018 - negative.   Past Medical History:  Diagnosis Date  . Arthritis   . CAD (coronary artery disease)    mild (cath 2010)  . Constipation 02/24/2014  . DOE (dyspnea on exertion)   . 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 Herpes simplex conjunctivitis   . Hyperlipidemia   . Hypertension   . Hyponatremia 02/24/2014  . Iron deficiency anemia due to chronic blood loss 07/07/2019  . Obesity   . Right shoulder pain 02/24/2014  . Type 2 diabetes mellitus ([place])   . Unspecified deficiency anemia 02/16/2014  . Weight loss 02/16/2014     Past Surgical History:  Procedure Laterality Date  . BACK SURGERY  1988  . CARDIAC CATHETERIZATION  2010   after abnormal stress test (03/2009) - mild coronary disease  .  Cardiometabolic Testing  45/8099   RER of 1.06, peak VO2 84% predicted; HR 87% predicted  . HERNIA REPAIR    . Skull Fracture Surgery  1945  . TRANSTHORACIC ECHOCARDIOGRAM  03/2009   EF 45-50%, mild conc LVH, mod septal hypokinesis, mod apical wall hypokinesis; RV systolic function borderline reduced; trace MR/TR   Family History  Problem Relation Age of Onset  . Hypertension Mother   . Diabetes Mother   . Heart attack Mother   . Stroke Mother   . Parkinson's disease Father   . Heart disease Father   . Diabetes Child   . Cancer Paternal Uncle        throat ca   Social History    Tobacco Use  . Smoking status: Former Smoker    Quit date: 04/02/1979    Years since quitting: 41.0  . Smokeless tobacco: Never Used  Substance Use Topics  . Alcohol use: Yes    Alcohol/week: 3.0 - 4.0 standard drinks    Types: 3 - 4 drink(s) per week  . Drug use: No   Current Outpatient Medications  Medication Sig Dispense Refill  . ACCU-CHEK AVIVA PLUS test strip 1 strip by Other route 2 (two) times daily. Use 1 strip to check glucose twice a day  0  . acyclovir (ZOVIRAX) 400 MG tablet Take 400 mg by mouth 2 (two) times daily.    . APPLE CIDER VINEGAR PO Take by mouth. 250 mg daily    . aspirin 81 MG tablet Take 81 mg by mouth daily.    Marland Kitchen atorvastatin (LIPITOR) 40 MG tablet Take 40 mg by mouth daily.    . Cholecalciferol (D3-50 PO) Take by mouth. Once daily    . Dulaglutide (TRULICITY [place]) Inject into the skin.    . finasteride (PROSCAR) 5 MG tablet Take 5 mg by mouth daily.    . Insulin Glargine (BASAGLAR KWIKPEN [place]) Inject into the skin. 7 units daily    . losartan (COZAAR) 50 MG tablet Take 50 mg by mouth daily.     . metFORMIN (GLUCOPHAGE) 1000 MG tablet Take 1,000 mg by mouth 2 (two) times daily with a meal.    . pantoprazole (PROTONIX) 40 MG tablet Take 40 mg by mouth daily.    . tamsulosin (FLOMAX) 0.4 MG CAPS capsule Take 0.4 mg by mouth daily.    . Thiamine HCl (VITAMIN B-1 PO) Take by mouth daily.    . TURMERIC PO Take by mouth. 1000 mg daily    . vitamin B-12 (CYANOCOBALAMIN) 1000 MCG tablet Take 1,000 mcg by mouth daily. Every other day     No current facility-administered medications for this visit.   Allergies  Allergen Reactions  . Bee Venom   . Iodine   . Oxycodone Nausea And Vomiting    Cold sweats, n/v and "out of it."     Review of Systems: Positive for fatigue, lightheadedness, allergy, sinus trouble, arthritis, hearing problems, shortness of breath, swelling of feet and legs. All other systems reviewed and negative except where noted in HPI.    PHYSICAL EXAM :    Wt Readings from Last 3 Encounters:  03/26/20 161 lb 9.6 oz (73.3 kg)  09/25/19 168 lb (76.2 kg)  08/08/19 164 lb (74.4 kg)    BP 124/66 (BP Location: Left Arm, Patient Position: Sitting)   Pulse 72   Ht 5\' 6"  (1.676 m)   Wt 161 lb 9.6 oz (73.3 kg)   BMI 26.08 kg/m  Constitutional:  Pleasant male in no acute distress. Psychiatric: Normal mood and affect. Behavior is normal. EENT: Pupils normal.  Conjunctivae are normal. No scleral icterus. Neck supple.  Cardiovascular: Normal rate, regular rhythm. No edema Pulmonary/chest: Effort normal and breath sounds normal. No wheezing, rales or rhonchi. Abdominal: Soft, nondistended, nontender. Bowel sounds active throughout. There are no masses palpable. No hepatomegaly. Neurological: Alert and oriented to person place and time. Skin: Skin is warm and dry. No rashes noted.  Tye Savoy, NP  03/26/2020, 3:27 PM  Cc:  Referring Provider Deland Pretty, MD

## 2020-03-27 ENCOUNTER — Encounter: Payer: Self-pay | Admitting: Nurse Practitioner

## 2020-03-29 NOTE — Progress Notes (Signed)
Reviewed and agree with management plans. ? ?Kenyah Luba L. Jaykob Minichiello, MD, MPH  ?

## 2020-03-30 DIAGNOSIS — E119 Type 2 diabetes mellitus without complications: Secondary | ICD-10-CM | POA: Diagnosis not present

## 2020-03-30 DIAGNOSIS — D72829 Elevated white blood cell count, unspecified: Secondary | ICD-10-CM | POA: Diagnosis not present

## 2020-03-30 DIAGNOSIS — I10 Essential (primary) hypertension: Secondary | ICD-10-CM | POA: Diagnosis not present

## 2020-03-30 DIAGNOSIS — I6529 Occlusion and stenosis of unspecified carotid artery: Secondary | ICD-10-CM | POA: Diagnosis not present

## 2020-03-30 DIAGNOSIS — E7800 Pure hypercholesterolemia, unspecified: Secondary | ICD-10-CM | POA: Diagnosis not present

## 2020-03-31 ENCOUNTER — Ambulatory Visit (AMBULATORY_SURGERY_CENTER): Payer: Medicare Other | Admitting: Gastroenterology

## 2020-03-31 ENCOUNTER — Other Ambulatory Visit: Payer: Self-pay

## 2020-03-31 ENCOUNTER — Encounter: Payer: Self-pay | Admitting: Gastroenterology

## 2020-03-31 VITALS — BP 114/67 | HR 65 | Temp 97.1°F | Resp 23 | Ht 66.0 in | Wt 161.0 lb

## 2020-03-31 DIAGNOSIS — R079 Chest pain, unspecified: Secondary | ICD-10-CM | POA: Diagnosis not present

## 2020-03-31 DIAGNOSIS — K2950 Unspecified chronic gastritis without bleeding: Secondary | ICD-10-CM | POA: Diagnosis not present

## 2020-03-31 DIAGNOSIS — R1310 Dysphagia, unspecified: Secondary | ICD-10-CM | POA: Diagnosis not present

## 2020-03-31 DIAGNOSIS — K2970 Gastritis, unspecified, without bleeding: Secondary | ICD-10-CM | POA: Diagnosis not present

## 2020-03-31 DIAGNOSIS — K3189 Other diseases of stomach and duodenum: Secondary | ICD-10-CM | POA: Diagnosis not present

## 2020-03-31 MED ORDER — SODIUM CHLORIDE 0.9 % IV SOLN: 500.0000 mL | Freq: Once | INTRAVENOUS | Status: DC

## 2020-03-31 NOTE — Progress Notes (Signed)
Called to room to assist during endoscopic procedure.  Patient ID and intended procedure confirmed with present staff. Received instructions for my participation in the procedure from the performing physician.  

## 2020-03-31 NOTE — Op Note (Signed)
[facility] Patient Name: Dustin Mccarthy Procedure Date: 03/31/2020 10:02 AM MRN: [id number] Endoscopist: Thornton Park MD, MD Age: 79 Referring MD:  Date of Birth: [birth year] Gender: Male Account #: [id number] Procedure:                Upper GI endoscopy Indications:              Unexplained iron deficiency anemia, Chest pain (non                            cardiac)                           Pill dysphagia                           Postprandial chest discomfort despite PPI with                            negative cardiac evaluation                           Chronic anemia with history of iron deficiency                            anemi and erythropoietin deficiency Medicines:                Monitored Anesthesia Care Procedure:                Pre-Anesthesia Assessment:                           - Prior to the procedure, a History and Physical                            was performed, and patient medications and                            allergies were reviewed. The patient's tolerance of                            previous anesthesia was also reviewed. The risks                            and benefits of the procedure and the sedation                            options and risks were discussed with the patient.                            All questions were answered, and informed consent                            was obtained. Prior Anticoagulants: The patient has                            taken no previous anticoagulant or antiplatelet  agents. ASA Grade Assessment: III - A patient with                            severe systemic disease. After reviewing the risks                            and benefits, the patient was deemed in                            satisfactory condition to undergo the procedure.                           After obtaining informed consent, the endoscope was                            passed under direct vision.  Throughout the                            procedure, the patient's blood pressure, pulse, and                            oxygen saturations were monitored continuously. The                            Endoscope was introduced through the mouth, and                            advanced to the third part of duodenum. The upper                            GI endoscopy was accomplished without difficulty.                            The patient tolerated the procedure well. Scope In: Scope Out: Findings:                 The examined esophagus was normal. Biopsies were                            obtained from the proximal and distal esophagus                            with cold forceps for histology. Estimated blood                            loss was minimal.                           The entire examined stomach was normal except for                            mild erythema in the distal body and fundus.  Biopsies were taken from the antrum, body, and                            fundus with a cold forceps for histology. Estimated                            blood loss was minimal.                           The examined duodenum was normal. Biopsies were                            taken with a cold forceps for histology. Estimated                            blood loss was minimal.                           The cardia and gastric fundus were normal on                            retroflexion.                           The exam was otherwise without abnormality. Complications:            No immediate complications. Estimated blood loss:                            Minimal. Estimated Blood Loss:     Estimated blood loss was minimal. Impression:               - Normal esophagus. Biopsied.                           - Normal stomach. Biopsied.                           - Normal examined duodenum. Biopsied.                           - The examination was otherwise  normal. Recommendation:           - Patient has a contact number available for                            emergencies. The signs and symptoms of potential                            delayed complications were discussed with the                            patient. Return to normal activities tomorrow.                            Written discharge instructions were provided to the  patient.                           - Resume previous diet.                           - Continue present medications.                           - Await pathology results.                           - Follow-up in the office with Tye Savoy to                            review these results. Thornton Park MD, MD 03/31/2020 10:24:46 AM This report has been signed electronically.

## 2020-03-31 NOTE — Progress Notes (Signed)
PT taken to PACU. Monitors in place. VSS. Report given to RN. 

## 2020-03-31 NOTE — Patient Instructions (Signed)
Await pathology results from Dr Tarri Glenn  YOU HAD AN ENDOSCOPIC PROCEDURE TODAY AT THE [facility]:   Refer to the procedure report that was given to you for any specific questions about what was found during the examination.  If the procedure report does not answer your questions, please call your gastroenterologist to clarify.  If you requested that your care partner not be given the details of your procedure findings, then the procedure report has been included in a sealed envelope for you to review at your convenience later.  YOU SHOULD EXPECT: Some feelings of bloating in the abdomen. Passage of more gas than usual.  Walking can help get rid of the air that was put into your GI tract during the procedure and reduce the bloating. If you had a lower endoscopy (such as a colonoscopy or flexible sigmoidoscopy) you may notice spotting of blood in your stool or on the toilet paper. If you underwent a bowel prep for your procedure, you may not have a normal bowel movement for a few days.  Please Note:  You might notice some irritation and congestion in your nose or some drainage.  This is from the oxygen used during your procedure.  There is no need for concern and it should clear up in a day or so.  SYMPTOMS TO REPORT IMMEDIATELY:    Following upper endoscopy (EGD)  Vomiting of blood or coffee ground material  New chest pain or pain under the shoulder blades  Painful or persistently difficult swallowing  New shortness of breath  Fever of 100F or higher  Black, tarry-looking stools  For urgent or emergent issues, a gastroenterologist can be reached at any hour by calling [phone]. Do not use MyChart messaging for urgent concerns.    DIET:  We do recommend a small meal at first, but then you may proceed to your regular diet.  Drink plenty of fluids but you should avoid alcoholic beverages for 24 hours.  ACTIVITY:  You should plan to take it easy for the rest of today and you  should NOT DRIVE or use heavy machinery until tomorrow (because of the sedation medicines used during the test).    FOLLOW UP: Our staff will call the number listed on your records 48-72 hours following your procedure to check on you and address any questions or concerns that you may have regarding the information given to you following your procedure. If we do not reach you, we will leave a message.  We will attempt to reach you two times.  During this call, we will ask if you have developed any symptoms of COVID 19. If you develop any symptoms (ie: fever, flu-like symptoms, shortness of breath, cough etc.) before then, please call [phone].  If you test positive for Covid 19 in the 2 weeks post procedure, please call and report this information to [place].    If any biopsies were taken you will be contacted by phone or by letter within the next 1-3 weeks.  Please call us at [phone] if you have not heard about the biopsies in 3 weeks.    SIGNATURES/CONFIDENTIALITY: You and/or your care partner have signed paperwork which will be entered into your electronic medical record.  These signatures attest to the fact that that the information above on your After Visit Summary has been reviewed and is understood.  Full responsibility of the confidentiality of this discharge information lies with you and/or your care-partner.

## 2020-03-31 NOTE — Progress Notes (Signed)
Vitals-CW  History reviewed. 

## 2020-04-02 ENCOUNTER — Telehealth: Payer: Self-pay | Admitting: *Deleted

## 2020-04-02 DIAGNOSIS — N401 Enlarged prostate with lower urinary tract symptoms: Secondary | ICD-10-CM | POA: Diagnosis not present

## 2020-04-02 DIAGNOSIS — E785 Hyperlipidemia, unspecified: Secondary | ICD-10-CM | POA: Diagnosis not present

## 2020-04-02 DIAGNOSIS — I10 Essential (primary) hypertension: Secondary | ICD-10-CM | POA: Diagnosis not present

## 2020-04-02 DIAGNOSIS — E119 Type 2 diabetes mellitus without complications: Secondary | ICD-10-CM | POA: Diagnosis not present

## 2020-04-02 NOTE — Telephone Encounter (Signed)
Attempted f/u phone call. No answer. Left message. °

## 2020-04-02 NOTE — Telephone Encounter (Signed)
  Follow up Call-  Call back number 03/31/2020  Post procedure Call Back phone  # [phone]  Permission to leave phone message Yes  Some recent data might be hidden     Patient questions:  Do you have a fever, pain , or abdominal swelling? No. Pain Score  0 *  Have you tolerated food without any problems? Yes.    Have you been able to return to your normal activities? Yes.    Do you have any questions about your discharge instructions: Diet   No. Medications  No. Follow up visit  No.  Do you have questions or concerns about your Care? No.  Actions: * If pain score is 4 or above: 1. No action needed, pain <4.Have you developed a fever since your procedure? no  2.   Have you had an respiratory symptoms (SOB or cough) since your procedure? no  3.   Have you tested positive for COVID 19 since your procedure no  4.   Have you had any family members/close contacts diagnosed with the COVID 19 since your procedure?  no   If yes to any of these questions please route to Joylene John, RN and Joella Prince, RN

## 2020-04-05 DIAGNOSIS — H25811 Combined forms of age-related cataract, right eye: Secondary | ICD-10-CM | POA: Diagnosis not present

## 2020-04-05 DIAGNOSIS — H35351 Cystoid macular degeneration, right eye: Secondary | ICD-10-CM | POA: Diagnosis not present

## 2020-04-05 DIAGNOSIS — H35363 Drusen (degenerative) of macula, bilateral: Secondary | ICD-10-CM | POA: Diagnosis not present

## 2020-04-05 DIAGNOSIS — H353131 Nonexudative age-related macular degeneration, bilateral, early dry stage: Secondary | ICD-10-CM | POA: Diagnosis not present

## 2020-04-05 DIAGNOSIS — Z961 Presence of intraocular lens: Secondary | ICD-10-CM | POA: Diagnosis not present

## 2020-04-05 DIAGNOSIS — H26491 Other secondary cataract, right eye: Secondary | ICD-10-CM | POA: Diagnosis not present

## 2020-04-05 DIAGNOSIS — H31093 Other chorioretinal scars, bilateral: Secondary | ICD-10-CM | POA: Diagnosis not present

## 2020-04-05 DIAGNOSIS — H3561 Retinal hemorrhage, right eye: Secondary | ICD-10-CM | POA: Diagnosis not present

## 2020-04-05 DIAGNOSIS — H1789 Other corneal scars and opacities: Secondary | ICD-10-CM | POA: Diagnosis not present

## 2020-04-05 DIAGNOSIS — H35041 Retinal micro-aneurysms, unspecified, right eye: Secondary | ICD-10-CM | POA: Diagnosis not present

## 2020-04-07 ENCOUNTER — Other Ambulatory Visit: Payer: Self-pay

## 2020-04-07 MED ORDER — PANTOPRAZOLE SODIUM 40 MG PO TBEC: 40.0000 mg | DELAYED_RELEASE_TABLET | Freq: Two times a day (BID) | ORAL | 3 refills | Status: DC

## 2020-04-09 DIAGNOSIS — N5201 Erectile dysfunction due to arterial insufficiency: Secondary | ICD-10-CM | POA: Diagnosis not present

## 2020-04-09 DIAGNOSIS — R3912 Poor urinary stream: Secondary | ICD-10-CM | POA: Diagnosis not present

## 2020-04-09 DIAGNOSIS — N401 Enlarged prostate with lower urinary tract symptoms: Secondary | ICD-10-CM | POA: Diagnosis not present

## 2020-04-14 DIAGNOSIS — D1801 Hemangioma of skin and subcutaneous tissue: Secondary | ICD-10-CM | POA: Diagnosis not present

## 2020-04-14 DIAGNOSIS — Z85828 Personal history of other malignant neoplasm of skin: Secondary | ICD-10-CM | POA: Diagnosis not present

## 2020-04-14 DIAGNOSIS — L821 Other seborrheic keratosis: Secondary | ICD-10-CM | POA: Diagnosis not present

## 2020-04-14 DIAGNOSIS — L565 Disseminated superficial actinic porokeratosis (DSAP): Secondary | ICD-10-CM | POA: Diagnosis not present

## 2020-04-14 DIAGNOSIS — L570 Actinic keratosis: Secondary | ICD-10-CM | POA: Diagnosis not present

## 2020-04-14 DIAGNOSIS — H61001 Unspecified perichondritis of right external ear: Secondary | ICD-10-CM | POA: Diagnosis not present

## 2020-04-21 ENCOUNTER — Encounter: Payer: Self-pay | Admitting: Nurse Practitioner

## 2020-04-21 ENCOUNTER — Ambulatory Visit (INDEPENDENT_AMBULATORY_CARE_PROVIDER_SITE_OTHER): Payer: Medicare Other | Admitting: Nurse Practitioner

## 2020-04-21 VITALS — BP 142/70 | HR 66 | Ht 66.14 in | Wt 167.0 lb

## 2020-04-21 DIAGNOSIS — R0602 Shortness of breath: Secondary | ICD-10-CM | POA: Diagnosis not present

## 2020-04-21 DIAGNOSIS — R079 Chest pain, unspecified: Secondary | ICD-10-CM | POA: Diagnosis not present

## 2020-04-21 DIAGNOSIS — K5909 Other constipation: Secondary | ICD-10-CM | POA: Diagnosis not present

## 2020-04-21 DIAGNOSIS — R109 Unspecified abdominal pain: Secondary | ICD-10-CM

## 2020-04-21 DIAGNOSIS — I2510 Atherosclerotic heart disease of native coronary artery without angina pectoris: Secondary | ICD-10-CM | POA: Diagnosis not present

## 2020-04-21 MED ORDER — DICYCLOMINE HCL 10 MG PO CAPS: 10.0000 mg | ORAL_CAPSULE | Freq: Every day | ORAL | 3 refills | Status: DC

## 2020-04-21 NOTE — Patient Instructions (Signed)
If you are age 79 or older, your body mass index should be between 23-30. Your Body mass index is 26.84 kg/m. If this is out of the aforementioned range listed, please consider follow up with your Primary Care Provider.  If you are age 32 or younger, your body mass index should be between 19-25. Your Body mass index is 26.84 kg/m. If this is out of the aformentioned range listed, please consider follow up with your Primary Care Provider.   Continue Pantoprazole twice daily for 30 days then decrease to once daily.  Start Bentyl 10 mg daily.  Start Miralax 1 capful in 8 ounces of liquid daily.

## 2020-04-21 NOTE — Progress Notes (Signed)
ASSESSMENT AND PLAN     # Chronic chest discomfort / shortness of breath. He had a cardiac evaluation in [place] over the summer and has been followed locally by Cardiology. EGD earlier this month was unrevealing. Since increasing PPI to BID he hasn't had any improvement in chest discomfort  # Occasional dysphagia to pills. Unremarkable esophagus on EGD. 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  --- Continue BID PPI for 30 days then decrease back to once daiy if not continuing to improve.  --Wife and patient had questions about EGD findings. We reviewed report together. Questions answered.   # Chronic constipation with decreased frequency of BMs and hard stools / intermittent lower abdominal cramping, sometimes better with defecation.  --Trial of low dose bentyl once daily (low dose due to history of enlarged prostate .)  --Sounds like he consumes adequate water. Eats fruits and nuts. --Trial of daily Miralax.    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 Followed by Dr. Marin Olp but not seen since March 2021. He is taking oral iron but only recently started taking it on a daily basis ( had to change type of oral iron due to constipation). He has a physical with PCP in January so hopefully labs will show that anemia has improved.  Recent duodenal biopsies negative. He had a complete colonoscopy with good prep with [facility] in Jan 2018. Other than diverticulosis exam was normal. Follow up colonoscopy not recommended due to age.     HISTORY OF PRESENT ILLNESS     Primary Gastroenterologist : Thornton Park, MD  Chief Complaint : multiple. Complains of abdominal cramps, constipation, ongoing chest discomfort , fatigue  Dustin Mccarthy is a 79 y.o. male with PMH / [place] significant for,  but not necessarily limited to: diabetes, anemia, CAD, HTN, hyperlipidemia  Patient was seen here the end of October with complaints of pill  dysphagia, postprandial chest discomfort.and hx of IDA. He underwent EGD earlier this month which was normal except for mild gastritis. No H.pylori on biopsies. Esophageal and duodenal biopsies negative.     Interval History:  Patient here with wife for follow up after EGD. Continues to get tightness in chest at times. As mentioned in my 10/29 note, patient had a cardiac evaluation in [place] over the summer. He is also followed by Dr. Debara Pickett though hasn't seen him in a while. Patient gets SOB with even minimal activity. While the chest pain is not new, he says the shortness of breath just started earlier this year. Marland KitchenHowever I see that Cardiology ( Dr. Debara Pickett)  evaluated him for SOB in 2018. He wonders if SOB secondary to anemia.    Esophageal biopsies were negative. His PPI was increased to BID x 8 weeks. Patient's pill dysphagia has slightly improved.  Patient complains of lower abdominal cramping. The cramps are often random, not always postprandial and usually get better with defecation. Cramping is not new but he didn't mention it at our last visit    Previous Endoscopic Evaluations / Pertinent Studies:   03/31/20 EGD --Mild gastritis  Esophageal biopsies - negative Gastric biopsies - chronic gastritis. No H.pylori Duodenal biopsies - negative  Past Medical History:  Diagnosis Date  . Arthritis   . CAD (coronary artery disease)    mild (cath 2010)  . Constipation 02/24/2014  . DOE (dyspnea on exertion)   . 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 GERD (gastroesophageal reflux  disease)   . Herpes simplex conjunctivitis   . Hyperlipidemia   . Hypertension   . Hyponatremia 02/24/2014  . Iron deficiency anemia due to chronic blood loss 07/07/2019  . Obesity   . Right shoulder pain 02/24/2014  . Type 2 diabetes mellitus ([place])   . Unspecified deficiency anemia 02/16/2014  . Weight loss 02/16/2014    Current Medications, Allergies, Past Surgical History, Family History and Social History  were reviewed in [employer] record.   Current Outpatient Medications  Medication Sig Dispense Refill  . ACCU-CHEK AVIVA PLUS test strip 1 strip by Other route 2 (two) times daily. Use 1 strip to check glucose twice a day  0  . acyclovir (ZOVIRAX) 400 MG tablet Take 400 mg by mouth 2 (two) times daily.    . APPLE CIDER VINEGAR PO Take by mouth. 250 mg daily    . aspirin 81 MG tablet Take 81 mg by mouth daily.    Marland Kitchen atorvastatin (LIPITOR) 40 MG tablet Take 40 mg by mouth daily.    . Cholecalciferol (D3-50 PO) Take by mouth. Once daily    . Dulaglutide (TRULICITY [place]) Inject into the skin.    . finasteride (PROSCAR) 5 MG tablet Take 5 mg by mouth daily.    . Insulin Glargine (BASAGLAR KWIKPEN [place]) Inject into the skin. 7 units daily    . losartan (COZAAR) 50 MG tablet Take 50 mg by mouth daily.     . metFORMIN (GLUCOPHAGE) 1000 MG tablet Take 1,000 mg by mouth 2 (two) times daily with a meal.    . pantoprazole (PROTONIX) 40 MG tablet Take 40 mg by mouth daily.    . pantoprazole (PROTONIX) 40 MG tablet Take 1 tablet (40 mg total) by mouth 2 (two) times daily before a meal. 60 tablet 3  . tamsulosin (FLOMAX) 0.4 MG CAPS capsule Take 0.4 mg by mouth daily.    . Thiamine HCl (VITAMIN B-1 PO) Take by mouth daily.    . TURMERIC PO Take by mouth. 1000 mg daily    . vitamin B-12 (CYANOCOBALAMIN) 1000 MCG tablet Take 1,000 mcg by mouth daily. Every other day     No current facility-administered medications for this visit.    Review of Systems:  No urinary complaints. No weight loss  PHYSICAL EXAM :    Wt Readings from Last 3 Encounters:  04/21/20 167 lb (75.8 kg)  03/31/20 161 lb (73 kg)  03/26/20 161 lb 9.6 oz (73.3 kg)    BP (!) 142/70   Pulse 66   Ht 5' 6.14" (1.68 m)   Wt 167 lb (75.8 kg)   BMI 26.84 kg/m  Constitutional:  Pleasant male in no acute distress. Psychiatric: Normal mood and affect. Behavior is normal. EENT: Pupils normal.  Conjunctivae are normal.  No scleral icterus. Neck supple.  Cardiovascular: Normal rate, regular rhythm. No edema Pulmonary/chest: Effort normal and breath sounds normal. No wheezing, rales or rhonchi. Abdominal: Soft, nondistended, nontender. Bowel sounds active throughout. There are no masses palpable.  Neurological: Alert and oriented to person place and time. Skin: Skin is warm and dry. No rashes noted.  Tye Savoy, NP  04/21/2020, 9:47 AM

## 2020-04-26 NOTE — Progress Notes (Signed)
Reviewed and agree with management plans. Consider esophageal manometry +/- 24 impedence testing if atypical chest pain persists.   Jennel Mara L. Tarri Glenn, MD, MPH

## 2020-05-04 DIAGNOSIS — Z23 Encounter for immunization: Secondary | ICD-10-CM | POA: Diagnosis not present

## 2020-05-05 ENCOUNTER — Ambulatory Visit (INDEPENDENT_AMBULATORY_CARE_PROVIDER_SITE_OTHER): Payer: Medicare Other | Admitting: Internal Medicine

## 2020-05-05 ENCOUNTER — Encounter: Payer: Self-pay | Admitting: Internal Medicine

## 2020-05-05 ENCOUNTER — Other Ambulatory Visit: Payer: Self-pay

## 2020-05-05 VITALS — BP 150/73 | HR 63 | Ht 66.0 in | Wt 165.0 lb

## 2020-05-05 DIAGNOSIS — R600 Localized edema: Secondary | ICD-10-CM

## 2020-05-05 DIAGNOSIS — I2510 Atherosclerotic heart disease of native coronary artery without angina pectoris: Secondary | ICD-10-CM

## 2020-05-05 DIAGNOSIS — I10 Essential (primary) hypertension: Secondary | ICD-10-CM | POA: Diagnosis not present

## 2020-05-05 DIAGNOSIS — R0600 Dyspnea, unspecified: Secondary | ICD-10-CM

## 2020-05-05 DIAGNOSIS — R0609 Other forms of dyspnea: Secondary | ICD-10-CM

## 2020-05-05 MED ORDER — HYDROCHLOROTHIAZIDE 12.5 MG PO CAPS: 12.5000 mg | ORAL_CAPSULE | Freq: Every day | ORAL | 3 refills | Status: DC

## 2020-05-05 NOTE — Progress Notes (Signed)
OFFICE NOTE  Chief Complaint:  No complaints  Primary Care Physician: Deland Pretty, MD  HPI:  Dustin Mccarthy  Is a 79 year old gentleman who has diabetes type 2, hypertension, dyslipidemia, and obesity. He is on insulin therapy and had a heart catheterization in 2010, which was negative, because of an abnormal stress test. He has also recently had some worsening shortness of breath and difficulty with weight loss. I recommended a metabolic test to further evaluate his shortness of breath. That was performed on March 12, 2012. It showed an RER of 1.06, a peak VO2 of 84% predicted. Heart rate was 87% predicted and the heart rate and VO2 curve showed a good agreement until anaerobic threshold with some flattening of his VO2 curve suggestive of an ischemic response. The VO2, however, is high enough and greater than 60, typically a cutoff for underlying coronary disease. This is a low-risk study and suggests small vessel ischemia. I recommended aerobic exercise and adding l-arginine 3 g p.o. t.i.d. to the diet. He actually did both of these things and reported some marked improvement in his shortness of breath. He did gardening for about 6 months and discontinued it. His exercise is ongoing he is managed to lose 6-8 pounds. He says he feels better and is not bothered by shortness of breath. He been followed closely by Sandi Mariscal (PharmD) at Dr. Peterson [place] office. He reports that his diabetes is fairly well controlled with an A1c of 6.9. He is cholesterol is also at goal.  Dustin Mccarthy returns today for follow-up. Recently he's been having some issues after he returned from a long visit in [place]. He has been noted to have high platelets, and elevated white blood cell count and anemia. He's been seen by hematology and they are fairly convinced he does not have a hematologic malignancy. He is also being seen by infectious diseases for possible exposure related abnormalities in his blood work. He denies any  chest pain or worsening shortness of breath but does have significant fatigue. He denies any fevers, chills or constitutional symptoms. He did have a lipid profile in September which showed total cholesterol of 96, triglycerides 57, HDL 35 and LDL of 50.  Dustin Mccarthy returns today for follow-up. Overall he is doing well denies any chest pain or worsening shortness of breath. Blood pressure was mildly elevated at 144/66 however recheck was 132/64. He says recently his blood pressure may be running slightly higher. I've asked him to follow this at home and talk with his primary care provider when he sees him in follow-up about it. A1c is 6.9 and he maintains on insulin and metformin. Cholesterol was recently checked and at goal. He denies any chest pain or worsening shortness of breath.  05/12/2016  Dustin Mccarthy returns today for follow-up. He denies a chest pain worsening shortness of breath. He has some mild sock line edema. Surprisingly his hemoglobin A1c has gone up despite medicine changes. His weight is actually down 17 pounds since I last saw him. He is on Jardience in addition to [place] and metformin. Blood pressure is well-controlled today.  04/04/2017  Dustin Mccarthy returns today for follow-up.  Over the last year he is done fairly well.  He denies any worsening chest pain or shortness of breath.  Hemoglobin A1c recently was 6.8.  His cholesterols been well controlled.  He is on a long-acting insulin in addition to oral medications for his diabetes.  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  He denies any specific activity but does say he feels better when he does some work in the yard.  11/30/2017  Dustin Mccarthy returns today for follow-up.  He is done well well over the past year.  Hemoglobin A1c slightly high at 7.1 but hovers around 7.  He is been put on [place] and Toujeo.  The Toujeo does seem to have helped his blood sugars.  He is required less insulin on Glyxambi.  His blood  sugars are now much lower.  Hopefully his more recent A1c will be even lower.  Cholesterol is at goal with most recent lipids this past August showed total cholesterol 103, HDL 34, LDL 48 and triglycerides 106.  He denies any chest pain or shortness of breath.  He is not as physically active as we would like him to be but is committed to starting some exercise and walking.  Weight is actually down about 10 pounds since last year.  04/02/2019  Dustin Mccarthy is seen today in follow-up.  Overall he is doing well and has no complaints.  He denies any chest pain or worsening shortness of breath.  His diabetes is well controlled.  LDL has been at target less than 70.  His blood pressure is also excellent today.  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  05/05/2020  Dustin Mccarthy returns for follow-up.  This is an annual visit although recently he was here with his wife.  Both of them had spent an extended period time out in [place] visiting with her daughter who was in a cardiac nurse.  He had had some difficulty with shortness of breath particular walking through the airport and had a history of anemia.  Ultimately established with a PCP there and was seen by a hematologist by his report.  He underwent multiple studies including CT scans and blood work and ultimately was placed on some iron.  Symptoms however had not improved and then he was referred to cardiology, being seen by Dr. Christa See with [facility]).  It sounds like by his report he had an echocardiogram, a cardiac PET and ultimately cardiac catheterization.  We have requested those records.  From what he tells me he was not found to have any obstructive coronary disease.  Today in follow-up he is noted to be slightly hypertensive.  This is been a more consistent trend which she says is similar to his blood pressure numbers at home.  He also has had some lower extremity edema.  EKG today shows sinus rhythm with first-degree  AV block at 63.  Lipids are well controlled with an LDL 45, total cholesterol 104, HDL 37 and triglycerides 99 (03/2020)  PMHx:  Past Medical History:  Diagnosis Date  . Arthritis   . CAD (coronary artery disease)    mild (cath 2010)  . Constipation 02/24/2014  . DOE (dyspnea on exertion)   . 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 GERD (gastroesophageal reflux disease)   . Herpes simplex conjunctivitis   . Hyperlipidemia   . Hypertension   . Hyponatremia 02/24/2014  . Iron deficiency anemia due to chronic blood loss 07/07/2019  . Obesity   . Right shoulder pain 02/24/2014  . Type 2 diabetes mellitus ([place])   . Unspecified deficiency anemia 02/16/2014  . Weight loss 02/16/2014    Past Surgical History:  Procedure Laterality Date  . BACK SURGERY  1988  . CARDIAC CATHETERIZATION  2010   after abnormal stress test (03/2009) -  mild coronary disease  . Cardiometabolic Testing  39/0300   RER of 1.06, peak VO2 84% predicted; HR 87% predicted  . HERNIA REPAIR    . Skull Fracture Surgery  1945  . TRANSTHORACIC ECHOCARDIOGRAM  03/2009   EF 45-50%, mild conc LVH, mod septal hypokinesis, mod apical wall hypokinesis; RV systolic function borderline reduced; trace MR/TR    FAMHx:  Family History  Problem Relation Age of Onset  . Hypertension Mother   . Diabetes Mother   . Heart attack Mother   . Stroke Mother   . Parkinson's disease Father   . Heart disease Father   . Diabetes Child   . Cancer Paternal Uncle        throat ca  . Colon cancer Neg Hx   . Esophageal cancer Neg Hx   . Stomach cancer Neg Hx   . Pancreatic cancer Neg Hx     SOCHx:   reports that he quit smoking about 41 years ago. He has never used smokeless tobacco. He reports current alcohol use of about 3.0 - 4.0 standard drinks of alcohol per week. He reports that he does not use drugs.  ALLERGIES:  Allergies  Allergen Reactions  . Bee Venom   . Iodine   . Oxycodone Nausea And Vomiting    Cold sweats, n/v  and "out of it."    ROS: Pertinent items noted in HPI and remainder of comprehensive ROS otherwise negative.  HOME MEDS: Current Outpatient Medications  Medication Sig Dispense Refill  . ACCU-CHEK AVIVA PLUS test strip 1 strip by Other route 2 (two) times daily. Use 1 strip to check glucose twice a day  0  . acyclovir (ZOVIRAX) 400 MG tablet Take 400 mg by mouth 2 (two) times daily.    . APPLE CIDER VINEGAR PO Take by mouth. 250 mg daily    . aspirin 81 MG tablet Take 81 mg by mouth daily.    Marland Kitchen atorvastatin (LIPITOR) 40 MG tablet Take 40 mg by mouth daily.    . Cholecalciferol (D3-50 PO) Take by mouth. Once daily    . dicyclomine (BENTYL) 10 MG capsule Take 1 capsule (10 mg total) by mouth daily. 30 capsule 3  . Dulaglutide (TRULICITY [place]) Inject into the skin.    . finasteride (PROSCAR) 5 MG tablet Take 5 mg by mouth daily.    . Insulin Glargine (BASAGLAR KWIKPEN [place]) Inject into the skin. 7 units daily    . losartan (COZAAR) 50 MG tablet Take 50 mg by mouth daily.     . metFORMIN (GLUCOPHAGE) 1000 MG tablet Take 1,000 mg by mouth 2 (two) times daily with a meal.    . pantoprazole (PROTONIX) 40 MG tablet Take 40 mg by mouth daily.    . pantoprazole (PROTONIX) 40 MG tablet Take 1 tablet (40 mg total) by mouth 2 (two) times daily before a meal. 60 tablet 3  . tamsulosin (FLOMAX) 0.4 MG CAPS capsule Take 0.4 mg by mouth daily.    . Thiamine HCl (VITAMIN B-1 PO) Take by mouth daily.    . TURMERIC PO Take by mouth. 1000 mg daily    . vitamin B-12 (CYANOCOBALAMIN) 1000 MCG tablet Take 1,000 mcg by mouth daily. Every other day     No current facility-administered medications for this visit.    LABS/IMAGING: No results found for this or any previous visit (from the past 48 hour(s)). No results found.  VITALS: BP (!) 150/73   Pulse 63   Ht 5'  6" (1.676 m)   Wt 165 lb (74.8 kg)   SpO2 99%   BMI 26.63 kg/m   EXAM: General appearance: alert and no distress Neck: no carotid bruit and  no JVD Lungs: clear to auscultation bilaterally Heart: regular rate and rhythm, S1, S2 normal, no murmur, click, rub or gallop Abdomen: soft, non-tender; bowel sounds normal; no masses,  no organomegaly Extremities: extremities normal, atraumatic, no cyanosis or edema Pulses: 2+ and symmetric Skin: Skin color, texture, turgor normal. No rashes or lesions Neurologic: Grossly normal Psych: Mood, affect normal  EKG: Sinus rhythm first-degree AV block at 63-personally reviewed  ASSESSMENT: 1. Leg edema 2. Hypertension 3. Dyslipidemia on atorvastatin 4. Insulin-dependent diabetes 5. Fatigue 6. Mild coronary artery disease by cath in 2010 -apparently no new obstructive coronary disease by cath and extensive cardiac work-up in [place] (2021)-records pending  PLAN: 1.   Dustin Mccarthy still gets some shortness of breath however both hematologic and cardiac work-up were unrevealing.  I will be requesting the records from the cardiologist in [place] to further understand this.  I think his blood pressure at times still runs a little high.  He has complained of some lower extremity edema.  He is on losartan.  I would recommend adding low-dose hydrochlorothiazide 12.5 mg daily to see if we can get any improvement in his swelling, perhaps a small decrease in his blood pressure and also may be some improvement in pulmonary congestion which could be a possible cause of shortness of breath.  Repeat metabolic profile in 1 to 2 weeks after starting the thiazide.  Ultimately we could consider combining this with the losartan and to 1 pill.  Follow-up in 3 months or sooner as necessary.  Pixie Casino, MD, [facility], [place] Director of the [facility] Lipid Disorders &  Cardiovascular Risk Reduction [facility] Diplomate of the American Board of Clinical Lipidology Attending Cardiologist  Direct Dial: [phone]  Fax: [phone]  Website:  [email or web address]  Lilya Smitherman 05/05/2020, 10:27 AM

## 2020-05-05 NOTE — Patient Instructions (Signed)
Medication Instructions:  Your physician has recommended you make the following change in your medication:  -- START hydrochlorothiazide 12.5mg  daily  *If you need a refill on your cardiac medications before your next appointment, please call your pharmacy*   Lab Work: BMET in 1-2 weeks  If you have labs (blood work) drawn today and your tests are completely normal, you will receive your results only by: Marland Kitchen MyChart Message (if you have MyChart) OR . A paper copy in the mail If you have any lab test that is abnormal or we need to change your treatment, we will call you to review the results.   Testing/Procedures: NONE   Follow-Up: At [facility], you and your health needs are our priority.  As part of our continuing mission to provide you with exceptional heart care, we have created designated Provider Care Teams.  These Care Teams include your primary Cardiologist (physician) and Advanced Practice Providers (APPs -  Physician Assistants and Nurse Practitioners) who all work together to provide you with the care you need, when you need it.  We recommend signing up for the patient portal called "MyChart".  Sign up information is provided on this After Visit Summary.  MyChart is used to connect with patients for Virtual Visits (Telemedicine).  Patients are able to view lab/test results, encounter notes, upcoming appointments, etc.  Non-urgent messages can be sent to your provider as well.   To learn more about what you can do with MyChart, go to [email or web address].    Your next appointment:   3 month(s)  The format for your next appointment:   In Person  Provider:   You may see Pixie Casino, MD or one of the following Advanced Practice Providers on your designated Care Team:    Almyra Deforest, PA-C  Fabian Sharp, PA-C or   Roby Lofts, [place]    Other Instructions

## 2020-06-11 DIAGNOSIS — Z1159 Encounter for screening for other viral diseases: Secondary | ICD-10-CM | POA: Diagnosis not present

## 2020-06-24 DIAGNOSIS — I6529 Occlusion and stenosis of unspecified carotid artery: Secondary | ICD-10-CM | POA: Diagnosis not present

## 2020-06-24 DIAGNOSIS — N401 Enlarged prostate with lower urinary tract symptoms: Secondary | ICD-10-CM | POA: Diagnosis not present

## 2020-06-24 DIAGNOSIS — E1139 Type 2 diabetes mellitus with other diabetic ophthalmic complication: Secondary | ICD-10-CM | POA: Diagnosis not present

## 2020-06-24 DIAGNOSIS — I10 Essential (primary) hypertension: Secondary | ICD-10-CM | POA: Diagnosis not present

## 2020-06-24 DIAGNOSIS — R5381 Other malaise: Secondary | ICD-10-CM | POA: Diagnosis not present

## 2020-06-24 DIAGNOSIS — I2510 Atherosclerotic heart disease of native coronary artery without angina pectoris: Secondary | ICD-10-CM | POA: Diagnosis not present

## 2020-06-24 DIAGNOSIS — Z0001 Encounter for general adult medical examination with abnormal findings: Secondary | ICD-10-CM | POA: Diagnosis not present

## 2020-06-24 DIAGNOSIS — R0600 Dyspnea, unspecified: Secondary | ICD-10-CM | POA: Diagnosis not present

## 2020-06-24 DIAGNOSIS — K219 Gastro-esophageal reflux disease without esophagitis: Secondary | ICD-10-CM | POA: Diagnosis not present

## 2020-08-03 DIAGNOSIS — I10 Essential (primary) hypertension: Secondary | ICD-10-CM | POA: Diagnosis not present

## 2020-08-03 DIAGNOSIS — E7800 Pure hypercholesterolemia, unspecified: Secondary | ICD-10-CM | POA: Diagnosis not present

## 2020-08-03 DIAGNOSIS — E119 Type 2 diabetes mellitus without complications: Secondary | ICD-10-CM | POA: Diagnosis not present

## 2020-08-05 ENCOUNTER — Ambulatory Visit (INDEPENDENT_AMBULATORY_CARE_PROVIDER_SITE_OTHER): Payer: Medicare Other | Admitting: Pulmonary Disease

## 2020-08-05 ENCOUNTER — Other Ambulatory Visit: Payer: Self-pay

## 2020-08-05 ENCOUNTER — Encounter: Payer: Self-pay | Admitting: Pulmonary Disease

## 2020-08-05 ENCOUNTER — Ambulatory Visit (INDEPENDENT_AMBULATORY_CARE_PROVIDER_SITE_OTHER): Payer: Medicare Other

## 2020-08-05 VITALS — BP 124/62 | HR 67 | Temp 97.4°F | Ht 66.0 in | Wt 166.0 lb

## 2020-08-05 DIAGNOSIS — I700 Atherosclerosis of aorta: Secondary | ICD-10-CM | POA: Diagnosis not present

## 2020-08-05 DIAGNOSIS — D509 Iron deficiency anemia, unspecified: Secondary | ICD-10-CM

## 2020-08-05 DIAGNOSIS — R0600 Dyspnea, unspecified: Secondary | ICD-10-CM

## 2020-08-05 DIAGNOSIS — R0609 Other forms of dyspnea: Secondary | ICD-10-CM

## 2020-08-05 LAB — CBC WITH DIFFERENTIAL/PLATELET
Basophils Absolute: 0 10*3/uL (ref 0.0–0.1)
Basophils Relative: 0.3 % (ref 0.0–3.0)
Eosinophils Absolute: 0.1 10*3/uL (ref 0.0–0.7)
Eosinophils Relative: 0.6 % (ref 0.0–5.0)
HCT: 32.6 % — ABNORMAL LOW (ref 39.0–52.0)
Hemoglobin: 10.9 g/dL — ABNORMAL LOW (ref 13.0–17.0)
Lymphocytes Relative: 21.7 % (ref 12.0–46.0)
Lymphs Abs: 2.3 10*3/uL (ref 0.7–4.0)
MCHC: 33.5 g/dL (ref 30.0–36.0)
MCV: 96.2 fl (ref 78.0–100.0)
Monocytes Absolute: 0.7 10*3/uL (ref 0.1–1.0)
Monocytes Relative: 7 % (ref 3.0–12.0)
Neutro Abs: 7.5 10*3/uL (ref 1.4–7.7)
Neutrophils Relative %: 70.4 % (ref 43.0–77.0)
Platelets: 370 10*3/uL (ref 150.0–400.0)
RBC: 3.39 Mil/uL — ABNORMAL LOW (ref 4.22–5.81)
RDW: 13.2 % (ref 11.5–15.5)
WBC: 10.6 10*3/uL — ABNORMAL HIGH (ref 4.0–10.5)

## 2020-08-05 NOTE — Patient Instructions (Addendum)
Nice to meet you  We need to get some more information to help determine why you are short of breath.  We will get a chest x-ray and a blood count today in the office, we will let you know the results in the coming days.  We need to get breathing (pulmonary function) tests.  These really help me understand how the lungs are functioning and how I can best help.  Once we have this information, we can discuss next steps.  We may need to start an inhaler or some other treatment.  Return to clinic in 6 weeks with Dr. Silas Flood  Schedule PFTs next available at patient convenience

## 2020-08-05 NOTE — Progress Notes (Signed)
@Patient  ID: Dustin Mccarthy, male    DOB: [birth year], 80 y.o.   MRN: [id number]  Chief Complaint  Patient presents with  . Consult    Referred by PCP for increased SOB since March 2021, especially with exertion. Denies any coughing.     Referring provider: Deland Pretty, MD  HPI:   80 year old whom we are seen in consultation for evaluation of dyspnea on exertion.  PCP note reviewed.  Cardiology note reviewed.  Patient had onset this nonexertional last spring.  Notes severe went on trip to [place].  Reports walking just several feet feel dyspnea on exertion.  Worse with inclines or stairs.  Overall stable.  No improvement over several months.  While he was in [place] he had a full cardiac work-up including cardiac catheterization, stress test.  This reported normal.  He thinks he had echocardiogram and told was normal.  I cannot view the results of the echocardiogram.  He was recently seen by cardiology here in town.  They reviewed records again conclusion the cardiac etiology of symptoms is unlikely.  This prompted referral here.  No change in dyspnea or timing differently for the day.  No seasonal or environmental changes to account for this.  No alleviating or exacerbating factors other than rest.  Breathing when at rest is normal.  He denies history of seasonal allergies.  He does note over the last month or so has had increased nasal congestion, watery eyes.  He has a history of iron deficiency anemia.  He was seen by hematology in [place] as well as locally.  He did receive an iron infusion a couple months ago.  Last hemoglobin was the in 12s, slightly low.  Not rechecked since 03/2020.  He is unsure if he had any chest imaging.  He assumed he had a chest x-ray but is unsure.  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  MMRC: mMRC Dyspnea Scale mMRC Score  07/28/2020 3    Epworth:  No flowsheet data found.  Tests:   FENO:  No results found for: NITRICOXIDE  PFT: No flowsheet data found.  WALK:  No flowsheet data found.  Imaging: No results found.  Lab Results: Personally reviewed CBC    Component Value Date/Time   WBC 10.6 (H) 03/26/2020 1623   RBC 3.78 (L) 03/26/2020 1623   HGB 12.0 (L) 03/26/2020 1623   HGB 11.6 (L) 08/08/2019 1029   HGB 11.7 (L) 03/31/2014 1432   HCT 36.3 (L) 03/26/2020 1623   HCT 35.7 (L) 03/31/2014 1432   PLT 316.0 03/26/2020 1623   PLT 306 08/08/2019 1029   PLT 419 (H) 03/31/2014 1432   MCV 96.1 03/26/2020 1623   MCV 86.4 03/31/2014 1432   MCH 31.0 08/08/2019 1029   MCHC 33.2 03/26/2020 1623   RDW 13.9 03/26/2020 1623   RDW 15.5 (H) 03/31/2014 1432   LYMPHSABS 2.8 08/08/2019 1029   LYMPHSABS 3.7 (H) 03/31/2014 1432   MONOABS 0.9 08/08/2019 1029   MONOABS 1.0 (H) 03/31/2014 1432   EOSABS 0.1 08/08/2019 1029   EOSABS 0.1 03/31/2014 1432   BASOSABS 0.0 08/08/2019 1029   BASOSABS 0.0 03/31/2014 1432    BMET    Component Value Date/Time   NA 134 (L) 08/08/2019 1029   NA  134 (L) 03/31/2014 1432   K 4.3 08/08/2019 1029   K 4.6 03/31/2014 1432   CL 101 08/08/2019 1029   CO2 23 08/08/2019 1029   CO2 23 03/31/2014 1432   GLUCOSE 246 (H) 08/08/2019 1029   GLUCOSE 167 (H) 03/31/2014 1432   BUN 30 (H) 08/08/2019 1029   BUN 17.2 03/31/2014 1432   CREATININE 1.14 08/08/2019 1029   CREATININE 1.0 03/31/2014 1432   CALCIUM 9.1 08/08/2019 1029   CALCIUM 9.5 03/31/2014 1432   GFRNONAA >60 08/08/2019 1029   GFRAA >60 08/08/2019 1029    BNP No results found for: BNP  ProBNP No results found for: PROBNP  Specialty Problems   None     Allergies  Allergen Reactions  . Bee Venom   . Iodine   . Oxycodone Nausea And Vomiting    Cold sweats, n/v and "out of  it."     There is no immunization history on file for this patient.  Past Medical History:  Diagnosis Date  . Arthritis   . CAD (coronary artery disease)    mild (cath 2010)  . Constipation 02/24/2014  . DOE (dyspnea on exertion)   . 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 GERD (gastroesophageal reflux disease)   . Herpes simplex conjunctivitis   . Hyperlipidemia   . Hypertension   . Hyponatremia 02/24/2014  . Iron deficiency anemia due to chronic blood loss 07/07/2019  . Obesity   . Right shoulder pain 02/24/2014  . Type 2 diabetes mellitus ([place])   . Unspecified deficiency anemia 02/16/2014  . Weight loss 02/16/2014    Tobacco History: Social History   Tobacco Use  Smoking Status Former Smoker  . Quit date: 04/02/1979  . Years since quitting: 41.3  Smokeless Tobacco Never Used   Counseling given: Not Answered   Continue to not smoke  Outpatient Encounter Medications as of 08/05/2020  Medication Sig  . ACCU-CHEK AVIVA PLUS test strip 1 strip by Other route 2 (two) times daily. Use 1 strip to check glucose twice a day  . acyclovir (ZOVIRAX) 400 MG tablet Take 400 mg by mouth 2 (two) times daily.  . APPLE CIDER VINEGAR PO Take by mouth. 250 mg daily  . aspirin 81 MG tablet Take 81 mg by mouth daily.  Marland Kitchen atorvastatin (LIPITOR) 40 MG tablet Take 40 mg by mouth daily.  . Cholecalciferol (D3-50 PO) Take by mouth. Once daily  . dicyclomine (BENTYL) 10 MG capsule Take 1 capsule (10 mg total) by mouth daily.  . Dulaglutide (TRULICITY [place]) Inject into the skin.  . finasteride (PROSCAR) 5 MG tablet Take 5 mg by mouth daily.  . Insulin Glargine (BASAGLAR KWIKPEN ) Inject 8 Units into the skin. 7 units daily  . losartan (COZAAR) 50 MG tablet Take 50 mg by mouth daily.   . metFORMIN (GLUCOPHAGE) 1000 MG tablet Take 1,000 mg by mouth 2 (two) times daily with a meal.  . pantoprazole (PROTONIX) 40 MG tablet Take 40 mg by mouth daily.  . tamsulosin (FLOMAX) 0.4 MG CAPS capsule Take  0.4 mg by mouth daily.  . Thiamine HCl (VITAMIN B-1 PO) Take by mouth daily.  . TURMERIC PO Take by mouth. 1000 mg daily  . vitamin B-12 (CYANOCOBALAMIN) 1000 MCG tablet Take 1,000 mcg by mouth daily. Every other day  . [DISCONTINUED] hydrochlorothiazide (MICROZIDE) 12.5 MG capsule Take 1 capsule (12.5 mg total) by mouth daily.  . [DISCONTINUED] pantoprazole (PROTONIX) 40 MG tablet Take 1 tablet (40 mg total) by mouth  2 (two) times daily before a meal.   No facility-administered encounter medications on file as of 07/27/2020.     Review of Systems  Review of Systems  No chest pain with exertion, no orthopnea or PND. Comprehensive ROS otherwise negative.   Physical Exam  BP 124/62   Pulse 67   Temp (!) 97.4 F (36.3 C) (Temporal)   Ht 5\' 6"  (1.676 m)   Wt 166 lb (75.3 kg)   SpO2 97% Comment: on RA  BMI 26.79 kg/m   Wt Readings from Last 5 Encounters:  08/03/2020 166 lb (75.3 kg)  05/05/20 165 lb (74.8 kg)  04/21/20 167 lb (75.8 kg)  03/31/20 161 lb (73 kg)  03/26/20 161 lb 9.6 oz (73.3 kg)    BMI Readings from Last 5 Encounters:  08/04/2020 26.79 kg/m  05/05/20 26.63 kg/m  04/21/20 26.84 kg/m  03/31/20 25.99 kg/m  03/26/20 26.08 kg/m     Physical Exam General: Well-appearing, no acute distress Eyes: EOMI, no icterus Neck: Supple, no JVP Pulmonary: Clear aspiration bilaterally, no wheezes, no crackles noted Cardiovascular: Regular rhythm, no murmur Abdomen: Nondistended, bowel sounds present MSK: No synovitis, no joint effusion Neuro: Normal gait, no weakness Psych: Normal mood, full affect  Assessment & Plan:   Dyspnea on exertion: Unclear etiology.  Large cardiac work-up reportedly normal.  I cannot review any of these results to confirm or deny.  Most interested in seeing echocardiogram given abnormal echocardiogram in 2010.  Reportedly had chest x-ray, may be CT scan done in [place], none of these results are available to view the images or review results.   Denies atopic symptoms.  Low suspicion for asthma.  He has iron deficiency anemia, last hemoglobin slightly low 03/2020.  Will repeat today.  Consider bronchodilators in the future pending work-up.  We will proceed with PFTs, ordered today.  Chest x-ray ordered today.   Return in about 6 weeks (around 09/16/2020).   Lanier Clam, MD 08/25/2020   This appointment required 63 minutes of patient care (this includes precharting, chart review, review of results, face-to-face care, etc.).

## 2020-08-06 ENCOUNTER — Encounter: Payer: Self-pay | Admitting: Internal Medicine

## 2020-08-06 ENCOUNTER — Ambulatory Visit (INDEPENDENT_AMBULATORY_CARE_PROVIDER_SITE_OTHER): Payer: Medicare Other | Admitting: Internal Medicine

## 2020-08-06 VITALS — BP 126/65 | HR 67 | Ht 66.0 in | Wt 167.4 lb

## 2020-08-06 DIAGNOSIS — D649 Anemia, unspecified: Secondary | ICD-10-CM | POA: Diagnosis not present

## 2020-08-06 DIAGNOSIS — R000 Tachycardia, unspecified: Secondary | ICD-10-CM | POA: Diagnosis not present

## 2020-08-06 DIAGNOSIS — R0600 Dyspnea, unspecified: Secondary | ICD-10-CM

## 2020-08-06 DIAGNOSIS — R0609 Other forms of dyspnea: Secondary | ICD-10-CM

## 2020-08-06 DIAGNOSIS — I2510 Atherosclerotic heart disease of native coronary artery without angina pectoris: Secondary | ICD-10-CM

## 2020-08-06 NOTE — Patient Instructions (Signed)

## 2020-08-06 NOTE — Progress Notes (Signed)
OFFICE NOTE  Chief Complaint:  Shortness of breath, tachycardia  Primary Care Physician: Deland Pretty, MD  HPI:  Dustin Mccarthy  Is a 80 year old gentleman who has diabetes type 2, hypertension, dyslipidemia, and obesity. He is on insulin therapy and had a heart catheterization in 2010, which was negative, because of an abnormal stress test. He has also recently had some worsening shortness of breath and difficulty with weight loss. I recommended a metabolic test to further evaluate his shortness of breath. That was performed on March 12, 2012. It showed an RER of 1.06, a peak VO2 of 84% predicted. Heart rate was 87% predicted and the heart rate and VO2 curve showed a good agreement until anaerobic threshold with some flattening of his VO2 curve suggestive of an ischemic response. The VO2, however, is high enough and greater than 60, typically a cutoff for underlying coronary disease. This is a low-risk study and suggests small vessel ischemia. I recommended aerobic exercise and adding l-arginine 3 g p.o. t.i.d. to the diet. He actually did both of these things and reported some marked improvement in his shortness of breath. He did gardening for about 6 months and discontinued it. His exercise is ongoing he is managed to lose 6-8 pounds. He says he feels better and is not bothered by shortness of breath. He been followed closely by Sandi Mariscal (PharmD) at Dr. Peterson [place] office. He reports that his diabetes is fairly well controlled with an A1c of 6.9. He is cholesterol is also at goal.  Dustin Mccarthy returns today for follow-up. Recently he's been having some issues after he returned from a long visit in [place]. He has been noted to have high platelets, and elevated white blood cell count and anemia. He's been seen by hematology and they are fairly convinced he does not have a hematologic malignancy. He is also being seen by infectious diseases for possible exposure related abnormalities in his blood  work. He denies any chest pain or worsening shortness of breath but does have significant fatigue. He denies any fevers, chills or constitutional symptoms. He did have a lipid profile in September which showed total cholesterol of 96, triglycerides 57, HDL 35 and LDL of 50.  Dustin Mccarthy returns today for follow-up. Overall he is doing well denies any chest pain or worsening shortness of breath. Blood pressure was mildly elevated at 144/66 however recheck was 132/64. He says recently his blood pressure may be running slightly higher. I've asked him to follow this at home and talk with his primary care provider when he sees him in follow-up about it. A1c is 6.9 and he maintains on insulin and metformin. Cholesterol was recently checked and at goal. He denies any chest pain or worsening shortness of breath.  05/12/2016  Dustin Mccarthy returns today for follow-up. He denies a chest pain worsening shortness of breath. He has some mild sock line edema. Surprisingly his hemoglobin A1c has gone up despite medicine changes. His weight is actually down 17 pounds since I last saw him. He is on Jardience in addition to [place] and metformin. Blood pressure is well-controlled today.  04/04/2017  Dustin Mccarthy returns today for follow-up.  Over the last year he is done fairly well.  He denies any worsening chest pain or shortness of breath.  Hemoglobin A1c recently was 6.8.  His cholesterols been well controlled.  He is on a long-acting insulin in addition to oral medications for his diabetes.  He has had some hearing loss and now  uses hearing aids.  He denies any specific activity but does say he feels better when he does some work in the yard.  11/30/2017  Dustin Mccarthy returns today for follow-up.  He is done well well over the past year.  Hemoglobin A1c slightly high at 7.1 but hovers around 7.  He is been put on [place] and Toujeo.  The Toujeo does seem to have helped his blood sugars.  He is required less insulin on  Glyxambi.  His blood sugars are now much lower.  Hopefully his more recent A1c will be even lower.  Cholesterol is at goal with most recent lipids this past August showed total cholesterol 103, HDL 34, LDL 48 and triglycerides 106.  He denies any chest pain or shortness of breath.  He is not as physically active as we would like him to be but is committed to starting some exercise and walking.  Weight is actually down about 10 pounds since last year.  04/02/2019  Dustin Mccarthy is seen today in follow-up.  Overall he is doing well and has no complaints.  He denies any chest pain or worsening shortness of breath.  His diabetes is well controlled.  LDL has been at target less than 70.  His blood pressure is also excellent today.  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  05/05/2020  Dustin Mccarthy returns for follow-up.  This is an annual visit although recently he was here with his wife.  Both of them had spent an extended period time out in [place] visiting with her daughter who was in a cardiac nurse.  He had had some difficulty with shortness of breath particular walking through the airport and had a history of anemia.  Ultimately established with a PCP there and was seen by a hematologist by his report.  He underwent multiple studies including CT scans and blood work and ultimately was placed on some iron.  Symptoms however had not improved and then he was referred to cardiology, being seen by Dr. Christa See with [facility]).  It sounds like by his report he had an echocardiogram, a cardiac PET and ultimately cardiac catheterization.  We have requested those records.  From what he tells me he was not found to have any obstructive coronary disease.  Today in follow-up he is noted to be slightly hypertensive.  This is been a more consistent trend which she says is similar to his blood pressure numbers at home.  He also has had some lower extremity edema.  EKG today shows sinus  rhythm with first-degree AV block at 63.  Lipids are well controlled with an LDL 45, total cholesterol 104, HDL 37 and triglycerides 99 (03/2020).  08/06/2020  Dustin Mccarthy returns today for follow-up.  Unfortunately I tried him on hydrochlorothiazide for some additional blood pressure lowering and edema.  He said the swelling did not really change but he was getting very dizzy and basically almost fell out of a chair at his desk twice.  His daughter thought it was likely the HCTZ and stopped it and his symptoms improved.  He still struggling with shortness of breath which also did not improve with the diuretic.  He can walk a small distance and gets short of breath and notes tachycardia.  He was referred to pulmonary and was seen just a few days ago.  No acute cardiopulmonary disease.  I personally felt that the right hemidiaphragm may be a little elevated.  He does have follow-up  with pulmonary.  He did have an extensive work-up in [place] including cardiac catheterization and PET as well as an echocardiogram all of which were not particularly revealing.  PMHx:  Past Medical History:  Diagnosis Date  . Arthritis   . CAD (coronary artery disease)    mild (cath 2010)  . Constipation 02/24/2014  . DOE (dyspnea on exertion)   . 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 GERD (gastroesophageal reflux disease)   . Herpes simplex conjunctivitis   . Hyperlipidemia   . Hypertension   . Hyponatremia 02/24/2014  . Iron deficiency anemia due to chronic blood loss 07/07/2019  . Obesity   . Right shoulder pain 02/24/2014  . Type 2 diabetes mellitus ([place])   . Unspecified deficiency anemia 02/16/2014  . Weight loss 02/16/2014    Past Surgical History:  Procedure Laterality Date  . BACK SURGERY  1988  . CARDIAC CATHETERIZATION  2010   after abnormal stress test (03/2009) - mild coronary disease  . Cardiometabolic Testing  40/9811   RER of 1.06, peak VO2 84% predicted; HR 87% predicted  . HERNIA REPAIR    .  Skull Fracture Surgery  1945  . TRANSTHORACIC ECHOCARDIOGRAM  03/2009   EF 45-50%, mild conc LVH, mod septal hypokinesis, mod apical wall hypokinesis; RV systolic function borderline reduced; trace MR/TR    FAMHx:  Family History  Problem Relation Age of Onset  . Hypertension Mother   . Diabetes Mother   . Heart attack Mother   . Stroke Mother   . Parkinson's disease Father   . Heart disease Father   . Diabetes Child   . Cancer Paternal Uncle        throat ca  . Colon cancer Neg Hx   . Esophageal cancer Neg Hx   . Stomach cancer Neg Hx   . Pancreatic cancer Neg Hx     SOCHx:   reports that he quit smoking about 41 years ago. He has never used smokeless tobacco. He reports current alcohol use of about 3.0 - 4.0 standard drinks of alcohol per week. He reports that he does not use drugs.  ALLERGIES:  Allergies  Allergen Reactions  . Bee Venom   . Iodine   . Oxycodone Nausea And Vomiting    Cold sweats, n/v and "out of it."    ROS: Pertinent items noted in HPI and remainder of comprehensive ROS otherwise negative.  HOME MEDS: Current Outpatient Medications  Medication Sig Dispense Refill  . ACCU-CHEK AVIVA PLUS test strip 1 strip by Other route 2 (two) times daily. Use 1 strip to check glucose twice a day  0  . acyclovir (ZOVIRAX) 400 MG tablet Take 400 mg by mouth 2 (two) times daily.    . APPLE CIDER VINEGAR PO Take by mouth. 250 mg daily    . aspirin 81 MG tablet Take 81 mg by mouth daily.    Marland Kitchen atorvastatin (LIPITOR) 40 MG tablet Take 40 mg by mouth daily.    . Cholecalciferol (D3-50 PO) Take by mouth. Once daily    . Dulaglutide (TRULICITY [place]) Inject into the skin.    . finasteride (PROSCAR) 5 MG tablet Take 5 mg by mouth daily.    . folic acid (FOLVITE) 1 MG tablet 1 tablet    . Insulin Glargine (BASAGLAR KWIKPEN [place]) Inject 8 Units into the skin. 7 units daily    . losartan (COZAAR) 50 MG tablet Take 50 mg by mouth daily.     . metFORMIN (GLUCOPHAGE)  1000 MG  tablet Take 1,000 mg by mouth 2 (two) times daily with a meal.    . pantoprazole (PROTONIX) 40 MG tablet Take 40 mg by mouth daily.    . tamsulosin (FLOMAX) 0.4 MG CAPS capsule Take 0.4 mg by mouth daily.    . Thiamine HCl (VITAMIN B-1 PO) Take by mouth daily.    . TURMERIC PO Take by mouth. 1000 mg daily    . vitamin B-12 (CYANOCOBALAMIN) 1000 MCG tablet Take 1,000 mcg by mouth daily. Every other day     No current facility-administered medications for this visit.    LABS/IMAGING: Results for orders placed or performed in visit on 08/05/20 (from the past 48 hour(s))  CBC w/Diff     Status: Abnormal   Collection Time: 08/05/20  2:26 PM  Result Value Ref Range   WBC 10.6 (H) 4.0 - 10.5 K/uL   RBC 3.39 (L) 4.22 - 5.81 Mil/uL   Hemoglobin 10.9 (L) 13.0 - 17.0 g/dL   HCT 32.6 (L) 39.0 - 52.0 %   MCV 96.2 78.0 - 100.0 fl   MCHC 33.5 30.0 - 36.0 g/dL   RDW 13.2 11.5 - 15.5 %   Platelets 370.0 150.0 - 400.0 K/uL   Neutrophils Relative % 70.4 43.0 - 77.0 %   Lymphocytes Relative 21.7 12.0 - 46.0 %   Monocytes Relative 7.0 3.0 - 12.0 %   Eosinophils Relative 0.6 0.0 - 5.0 %   Basophils Relative 0.3 0.0 - 3.0 %   Neutro Abs 7.5 1.4 - 7.7 K/uL   Lymphs Abs 2.3 0.7 - 4.0 K/uL   Monocytes Absolute 0.7 0.1 - 1.0 K/uL   Eosinophils Absolute 0.1 0.0 - 0.7 K/uL   Basophils Absolute 0.0 0.0 - 0.1 K/uL   No results found.  VITALS: BP 126/65   Pulse 67   Ht 5\' 6"  (1.676 m)   Wt 167 lb 6.4 oz (75.9 kg)   SpO2 99%   BMI 27.02 kg/m   EXAM: General appearance: alert and no distress Neck: no carotid bruit and no JVD Lungs: clear to auscultation bilaterally Heart: regular rate and rhythm, S1, S2 normal, no murmur, click, rub or gallop Abdomen: soft, non-tender; bowel sounds normal; no masses,  no organomegaly Extremities: extremities normal, atraumatic, no cyanosis or edema Pulses: 2+ and symmetric Skin: Skin color, texture, turgor normal. No rashes or lesions Neurologic: Grossly  normal Psych: Mood, affect normal  EKG: Sinus rhythm first-degree AV block, right bundle branch block at 67-personally reviewed  ASSESSMENT: 1. Shortness of breath, tachycardia 2. Hypertension 3. Dyslipidemia on atorvastatin 4. Insulin-dependent diabetes 5. Fatigue 6. Mild coronary artery disease by cath in 2010 -apparently no new obstructive coronary disease by cath and extensive cardiac work-up in [place] (2021)-records pending  PLAN: 1.   Dustin Mccarthy continues to struggle with shortness of breath and tachycardia he says with minimal exertion.  He says he just feels awful.  He was sent to pulmonary and a chest x-ray was essentially unrevealing.  He is supposed to have pulmonary function testing.  He had a thorough cardiac work-up in [place] including a PET scan, echocardiogram and cardiac catheterization.  No clear etiology for symptoms was noted.  He did recently have repeat lab work which showed some progressive anemia however his hemoglobin was just over 10.  I am not certain whether this will cause him his significant shortness of breath however he may need additional iron repletion or transfusion to see if it improves his symptoms.  In the past he had been followed by hematology I believe Dr. Marin Olp.  Plan follow-up with me in 6 months or sooner as necessary.  Pixie Casino, MD, [facility], [place] Director of the [facility] Lipid Disorders &  Cardiovascular Risk Reduction [facility] Diplomate of the American Board of Clinical Lipidology Attending Cardiologist  Direct Dial: [phone]  Fax: [phone]  Website:  [email or web address]  08/06/2020, 1:49 PM

## 2020-09-23 ENCOUNTER — Other Ambulatory Visit: Payer: Self-pay

## 2020-09-23 ENCOUNTER — Ambulatory Visit (INDEPENDENT_AMBULATORY_CARE_PROVIDER_SITE_OTHER): Payer: Medicare Other | Admitting: Pulmonary Disease

## 2020-09-23 ENCOUNTER — Encounter: Payer: Self-pay | Admitting: Pulmonary Disease

## 2020-09-23 VITALS — BP 116/74 | HR 64 | Temp 97.2°F | Ht 66.0 in | Wt 164.8 lb

## 2020-09-23 DIAGNOSIS — R0609 Other forms of dyspnea: Secondary | ICD-10-CM

## 2020-09-23 DIAGNOSIS — R0600 Dyspnea, unspecified: Secondary | ICD-10-CM | POA: Diagnosis not present

## 2020-09-23 NOTE — Patient Instructions (Addendum)
Nice to see you - we will get the breathing tests ASAP  Once we have results I will call you and talk about any medicines or inhalers we may need - IF we need them.   Return to clinic in 3 months with Dr. Silas Flood

## 2020-09-28 ENCOUNTER — Other Ambulatory Visit (HOSPITAL_COMMUNITY)
Admission: RE | Admit: 2020-09-28 | Discharge: 2020-09-28 | Disposition: A | Discharge status: Home / Self Care | Payer: Medicare Other | Source: Ambulatory Visit | Attending: Pulmonary Disease | Admitting: Pulmonary Disease

## 2020-09-28 DIAGNOSIS — Z20822 Contact with and (suspected) exposure to covid-19: Secondary | ICD-10-CM | POA: Diagnosis not present

## 2020-09-28 DIAGNOSIS — Z01812 Encounter for preprocedural laboratory examination: Secondary | ICD-10-CM | POA: Diagnosis not present

## 2020-09-29 LAB — SARS CORONAVIRUS 2 (TAT 6-24 HRS): SARS Coronavirus 2: NEGATIVE

## 2020-09-30 ENCOUNTER — Ambulatory Visit (INDEPENDENT_AMBULATORY_CARE_PROVIDER_SITE_OTHER): Payer: Medicare Other | Admitting: Pulmonary Disease

## 2020-09-30 ENCOUNTER — Other Ambulatory Visit: Payer: Self-pay

## 2020-09-30 DIAGNOSIS — R0609 Other forms of dyspnea: Secondary | ICD-10-CM

## 2020-09-30 DIAGNOSIS — R0600 Dyspnea, unspecified: Secondary | ICD-10-CM | POA: Diagnosis not present

## 2020-09-30 LAB — PULMONARY FUNCTION TEST
DL/VA % pred: 102 %
DL/VA: 4.05 ml/min/mmHg/L
DLCO cor % pred: 112 %
DLCO cor: 24.08 ml/min/mmHg
DLCO unc % pred: 112 %
DLCO unc: 24.08 ml/min/mmHg
FEF 25-75 Post: 2.63 L/sec
FEF 25-75 Pre: 3.11 L/sec
FEF2575-%Change-Post: -15 %
FEF2575-%Pred-Post: 162 %
FEF2575-%Pred-Pre: 193 %
FEV1-%Change-Post: -4 %
FEV1-%Pred-Post: 126 %
FEV1-%Pred-Pre: 131 %
FEV1-Post: 3 L
FEV1-Pre: 3.13 L
FEV1FVC-%Change-Post: 2 %
FEV1FVC-%Pred-Pre: 113 %
FEV6-%Change-Post: -5 %
FEV6-%Pred-Post: 115 %
FEV6-%Pred-Pre: 122 %
FEV6-Post: 3.61 L
FEV6-Pre: 3.82 L
FEV6FVC-%Pred-Post: 108 %
FEV6FVC-%Pred-Pre: 108 %
FVC-%Change-Post: -6 %
FVC-%Pred-Post: 106 %
FVC-%Pred-Pre: 113 %
FVC-Post: 3.61 L
FVC-Pre: 3.85 L
Post FEV1/FVC ratio: 83 %
Post FEV6/FVC ratio: 100 %
Pre FEV1/FVC ratio: 81 %
Pre FEV6/FVC Ratio: 100 %
RV % pred: 96 %
RV: 2.36 L
TLC % pred: 100 %
TLC: 6.26 L

## 2020-09-30 NOTE — Progress Notes (Signed)
Full PFT performed today. °

## 2020-09-30 NOTE — Patient Instructions (Signed)
Pulmonary function test performed today. 

## 2020-10-13 DIAGNOSIS — H26491 Other secondary cataract, right eye: Secondary | ICD-10-CM | POA: Diagnosis not present

## 2020-10-13 DIAGNOSIS — H353131 Nonexudative age-related macular degeneration, bilateral, early dry stage: Secondary | ICD-10-CM | POA: Diagnosis not present

## 2020-10-13 DIAGNOSIS — H3582 Retinal ischemia: Secondary | ICD-10-CM | POA: Diagnosis not present

## 2020-10-13 DIAGNOSIS — Z961 Presence of intraocular lens: Secondary | ICD-10-CM | POA: Diagnosis not present

## 2020-10-13 DIAGNOSIS — H35341 Macular cyst, hole, or pseudohole, right eye: Secondary | ICD-10-CM | POA: Diagnosis not present

## 2020-10-13 DIAGNOSIS — H3561 Retinal hemorrhage, right eye: Secondary | ICD-10-CM | POA: Diagnosis not present

## 2020-10-13 DIAGNOSIS — H35011 Changes in retinal vascular appearance, right eye: Secondary | ICD-10-CM | POA: Diagnosis not present

## 2020-10-13 DIAGNOSIS — H25812 Combined forms of age-related cataract, left eye: Secondary | ICD-10-CM | POA: Diagnosis not present

## 2020-10-13 DIAGNOSIS — H35363 Drusen (degenerative) of macula, bilateral: Secondary | ICD-10-CM | POA: Diagnosis not present

## 2020-10-13 DIAGNOSIS — H31093 Other chorioretinal scars, bilateral: Secondary | ICD-10-CM | POA: Diagnosis not present

## 2020-10-19 DIAGNOSIS — E7800 Pure hypercholesterolemia, unspecified: Secondary | ICD-10-CM | POA: Diagnosis not present

## 2020-10-19 DIAGNOSIS — Z794 Long term (current) use of insulin: Secondary | ICD-10-CM | POA: Diagnosis not present

## 2020-10-19 DIAGNOSIS — E11319 Type 2 diabetes mellitus with unspecified diabetic retinopathy without macular edema: Secondary | ICD-10-CM | POA: Diagnosis not present

## 2020-10-19 DIAGNOSIS — I10 Essential (primary) hypertension: Secondary | ICD-10-CM | POA: Diagnosis not present

## 2020-11-17 NOTE — Progress Notes (Signed)
@Patient  ID: Dustin Mccarthy, male    DOB: 1941/05/06, 80 y.o.   MRN: [id number]  Chief Complaint  Patient presents with   Follow-up    Wants to review labs and CT results that he brought in.     Referring provider: Deland Pretty, MD  HPI:   80 year old whom we are seeing in follow up for DOE.  Doing ok. Symptoms stable. No real improvement or worsening. Brings report of CT to review. PFT performed today. Reviewed with patient. Spirometry normal, no BD response, TLC WNL, DLCO WNL.  HPI at initial visit: Patient had onset this nonexertional last spring.  Notes severe went on trip to [place].  Reports walking just several feet feel dyspnea on exertion.  Worse with inclines or stairs.  Overall stable.  No improvement over several months.  While he was in [place] he had a full cardiac work-up including cardiac catheterization, stress test.  This reported normal.  He thinks he had echocardiogram and told was normal.  I cannot view the results of the echocardiogram.  He was recently seen by cardiology here in town.  They reviewed records again conclusion the cardiac etiology of symptoms is unlikely.  This prompted referral here.  No change in dyspnea or timing differently for the day.  No seasonal or environmental changes to account for this.  No alleviating or exacerbating factors other than rest.  Breathing when at rest is normal.  He denies history of seasonal allergies.  He does note over the last month or so has had increased nasal congestion, watery eyes.  He has a history of iron deficiency anemia.  He was seen by hematology in [place] as well as locally.  He did receive an iron infusion a couple months ago.  Last hemoglobin was the in 12s, slightly low.  Not rechecked since 03/2020.  He is unsure if he had any chest imaging.  He assumed he had a chest x-ray but is unsure.  PMH: DM Surgical History: back surgery, hernia surgery  Family History: HTN, CVA, CAD in mom, Dad with CVA Social  History: former smoker, < 5 pack year by my calculations, retired Education officer, [employer] / Pulmonary Flowsheets:   ACT:  No flowsheet data found.  MMRC: mMRC Dyspnea Scale mMRC Score  08/05/2020 3    Epworth:  No flowsheet data found.  Tests:   FENO:  No results found for: NITRICOXIDE  PFT: PFT Results Latest Ref Rng & Units 09/30/2020  FVC-Pre L 3.85  FVC-Predicted Pre % 113  FVC-Post L 3.61  FVC-Predicted Post % 106  Pre FEV1/FVC % % 81  Post FEV1/FCV % % 83  FEV1-Pre L 3.13  FEV1-Predicted Pre % 131  FEV1-Post L 3.00  DLCO uncorrected ml/min/mmHg 24.08  DLCO UNC% % 112  DLCO corrected ml/min/mmHg 24.08  DLCO COR %Predicted % 112  DLVA Predicted % 102  TLC L 6.26  TLC % Predicted % 100  RV % Predicted % 96    WALK:  No flowsheet data found.  Imaging: No results found.  Lab Results: Personally reviewed CBC    Component Value Date/Time   WBC 10.6 (H) 08/05/2020 1426   RBC 3.39 (L) 08/05/2020 1426   HGB 10.9 (L) 08/05/2020 1426   HGB 11.6 (L) 08/08/2019 1029   HGB 11.7 (L) 03/31/2014 1432   HCT 32.6 (L) 08/05/2020 1426   HCT 35.7 (L) 03/31/2014 1432   PLT 370.0 08/05/2020 1426   PLT 306 08/08/2019 1029   PLT  419 (H) 03/31/2014 1432   MCV 96.2 08/05/2020 1426   MCV 86.4 03/31/2014 1432   MCH 31.0 08/08/2019 1029   MCHC 33.5 08/05/2020 1426   RDW 13.2 08/05/2020 1426   RDW 15.5 (H) 03/31/2014 1432   LYMPHSABS 2.3 08/05/2020 1426   LYMPHSABS 3.7 (H) 03/31/2014 1432   MONOABS 0.7 08/05/2020 1426   MONOABS 1.0 (H) 03/31/2014 1432   EOSABS 0.1 08/05/2020 1426   EOSABS 0.1 03/31/2014 1432   BASOSABS 0.0 08/05/2020 1426   BASOSABS 0.0 03/31/2014 1432    BMET    Component Value Date/Time   NA 134 (L) 08/08/2019 1029   NA 134 (L) 03/31/2014 1432   K 4.3 08/08/2019 1029   K 4.6 03/31/2014 1432   CL 101 08/08/2019 1029   CO2 23 08/08/2019 1029   CO2 23 03/31/2014 1432   GLUCOSE 246 (H) 08/08/2019 1029   GLUCOSE 167 (H) 03/31/2014  1432   BUN 30 (H) 08/08/2019 1029   BUN 17.2 03/31/2014 1432   CREATININE 1.14 08/08/2019 1029   CREATININE 1.0 03/31/2014 1432   CALCIUM 9.1 08/08/2019 1029   CALCIUM 9.5 03/31/2014 1432   GFRNONAA >60 08/08/2019 1029   GFRAA >60 08/08/2019 1029    BNP No results found for: BNP  ProBNP No results found for: PROBNP  Specialty Problems   None  Allergies  Allergen Reactions   Bee Venom    Iodine    Oxycodone Nausea And Vomiting    Cold sweats, n/v and "out of it."    Immunization History  Administered Date(s) Administered   Influenza, High Dose Seasonal PF 01/27/2018   Influenza-Unspecified 02/10/2019, 01/28/2020   Moderna Sars-Covid-2 Vaccination 06/12/2019, 07/10/2019   Pneumococcal Polysaccharide-23 02/27/2016    Past Medical History:  Diagnosis Date   Arthritis    CAD (coronary artery disease)    mild (cath 2010)   Constipation 02/24/2014   DOE (dyspnea on exertion)    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   GERD (gastroesophageal reflux disease)    Herpes simplex conjunctivitis    Hyperlipidemia    Hypertension    Hyponatremia 02/24/2014   Iron deficiency anemia due to chronic blood loss 07/07/2019   Obesity    Right shoulder pain 02/24/2014   Type 2 diabetes mellitus ([place])    Unspecified deficiency anemia 02/16/2014   Weight loss 02/16/2014    Tobacco History: Social History   Tobacco Use  Smoking Status Former   Pack years: 0.00   Types: Cigarettes   Quit date: 04/02/1979   Years since quitting: 41.6  Smokeless Tobacco Never   Counseling given: Not Answered   Continue to not smoke  Outpatient Encounter Medications as of 09/23/2020  Medication Sig   ACCU-CHEK AVIVA PLUS test strip 1 strip by Other route 2 (two) times daily. Use 1 strip to check glucose twice a day   acyclovir (ZOVIRAX) 400 MG tablet Take 400 mg by mouth 2 (two) times daily.   APPLE CIDER VINEGAR PO Take by mouth. 250 mg daily   aspirin 81 MG tablet Take 81 mg by mouth  daily.   atorvastatin (LIPITOR) 40 MG tablet Take 40 mg by mouth daily.   Cholecalciferol (D3-50 PO) Take by mouth. Once daily   Dulaglutide (TRULICITY [place]) Inject into the skin.   finasteride (PROSCAR) 5 MG tablet Take 5 mg by mouth daily.   folic acid (FOLVITE) 1 MG tablet 1 tablet   Insulin Glargine (BASAGLAR KWIKPEN [place]) Inject 8 Units into the skin. 7 units daily  losartan (COZAAR) 50 MG tablet Take 50 mg by mouth daily.    metFORMIN (GLUCOPHAGE) 1000 MG tablet Take 1,000 mg by mouth 2 (two) times daily with a meal.   pantoprazole (PROTONIX) 40 MG tablet Take 40 mg by mouth daily.   tamsulosin (FLOMAX) 0.4 MG CAPS capsule Take 0.4 mg by mouth daily.   Thiamine HCl (VITAMIN B-1 PO) Take by mouth daily.   TURMERIC PO Take by mouth. 1000 mg daily   vitamin B-12 (CYANOCOBALAMIN) 1000 MCG tablet Take 1,000 mcg by mouth daily. Every other day   No facility-administered encounter medications on file as of 09/23/2020.     Review of Systems  Review of Systems  N/a  Physical Exam  BP 116/74 (BP Location: Left Arm, Cuff Size: Normal)   Pulse 64   Temp (!) 97.2 F (36.2 C) (Temporal)   Ht 5\' 6"  (1.676 m)   Wt 164 lb 12.8 oz (74.8 kg)   SpO2 99% Comment: RA  BMI 26.60 kg/m   Wt Readings from Last 5 Encounters:  09/23/20 164 lb 12.8 oz (74.8 kg)  08/06/20 167 lb 6.4 oz (75.9 kg)  08/05/20 166 lb (75.3 kg)  05/05/20 165 lb (74.8 kg)  04/21/20 167 lb (75.8 kg)    BMI Readings from Last 5 Encounters:  09/23/20 26.60 kg/m  08/06/20 27.02 kg/m  08/05/20 26.79 kg/m  05/05/20 26.63 kg/m  04/21/20 26.84 kg/m     Physical Exam General: Well-appearing, no acute distress Eyes: EOMI, no icterus Neck: Supple, no JVP Pulmonary: Clear aspiration bilaterally, no wheezes, no crackles noted Cardiovascular: Regular rhythm, no murmur Psych: Normal mood, full affect  Assessment & Plan:   Dyspnea on exertion: Unclear etiology.  Large cardiac work-up reportedly normal.  Chest Xray  clear. CT images from [place] clear per report. Will pursue PFTs in coming weeks for further characterization.    Return in about 3 months (around 12/23/2020).   Lanier Clam, MD 11/17/2020

## 2020-11-18 ENCOUNTER — Encounter: Payer: Self-pay | Admitting: Pulmonary Disease

## 2020-11-18 ENCOUNTER — Ambulatory Visit (INDEPENDENT_AMBULATORY_CARE_PROVIDER_SITE_OTHER): Payer: Medicare Other | Admitting: Pulmonary Disease

## 2020-11-18 ENCOUNTER — Other Ambulatory Visit: Payer: Self-pay

## 2020-11-18 ENCOUNTER — Other Ambulatory Visit (INDEPENDENT_AMBULATORY_CARE_PROVIDER_SITE_OTHER): Payer: Medicare Other

## 2020-11-18 VITALS — BP 124/70 | HR 73 | Temp 98.7°F | Ht 66.0 in | Wt 164.8 lb

## 2020-11-18 DIAGNOSIS — R0600 Dyspnea, unspecified: Secondary | ICD-10-CM

## 2020-11-18 DIAGNOSIS — R0609 Other forms of dyspnea: Secondary | ICD-10-CM

## 2020-11-18 LAB — CBC WITH DIFFERENTIAL/PLATELET
Basophils Absolute: 0.1 10*3/uL (ref 0.0–0.1)
Basophils Relative: 0.7 % (ref 0.0–3.0)
Eosinophils Absolute: 0 10*3/uL (ref 0.0–0.7)
Eosinophils Relative: 0.2 % (ref 0.0–5.0)
HCT: 34.5 % — ABNORMAL LOW (ref 39.0–52.0)
Hemoglobin: 11.5 g/dL — ABNORMAL LOW (ref 13.0–17.0)
Lymphocytes Relative: 20.4 % (ref 12.0–46.0)
Lymphs Abs: 2.2 10*3/uL (ref 0.7–4.0)
MCHC: 33.4 g/dL (ref 30.0–36.0)
MCV: 94.5 fl (ref 78.0–100.0)
Monocytes Absolute: 0.8 10*3/uL (ref 0.1–1.0)
Monocytes Relative: 7.3 % (ref 3.0–12.0)
Neutro Abs: 7.5 10*3/uL (ref 1.4–7.7)
Neutrophils Relative %: 71.4 % (ref 43.0–77.0)
Platelets: 323 10*3/uL (ref 150.0–400.0)
RBC: 3.65 Mil/uL — ABNORMAL LOW (ref 4.22–5.81)
RDW: 13.3 % (ref 11.5–15.5)
WBC: 10.5 10*3/uL (ref 4.0–10.5)

## 2020-11-18 NOTE — Patient Instructions (Signed)
The pulmonary function tests - spirometry, lung volumes and diffusion (DLCO) - are normal. This is reassuring that the lungs are healthy.  I have ordered a repeat blood count today to make sure the mild anemia has not worsened.  I have ordered a cardiopulmonary exercise test to furthee evaluate the shortness of breath.

## 2020-11-18 NOTE — Progress Notes (Signed)
CBC with stable mild anemia. Unlikely to be cause of ongoing DOE.

## 2020-11-18 NOTE — Progress Notes (Signed)
@Patient  ID: Dustin Mccarthy, male    DOB: 1941/03/15, 80 y.o.   MRN: [id number]  Chief Complaint  Patient presents with   Follow-up    1 mo f/u after PFT. States his breathing has not changed since last visit. Denies any new symptoms.     Referring provider: Deland Pretty, MD  HPI:   80 year old whom we are seeing in follow up for DOE.  Doing ok. Symptoms stable. No real improvement or worsening. PFT reviewed today. Reviewed with patient. Spirometry normal, no BD response, TLC WNL, DLCO WNL. Reassuring from lung standpoint.  HPI at initial visit: Patient had onset this nonexertional last spring.  Notes severe went on trip to [place].  Reports walking just several feet feel dyspnea on exertion.  Worse with inclines or stairs.  Overall stable.  No improvement over several months.  While he was in [place] he had a full cardiac work-up including cardiac catheterization, stress test.  This reported normal.  He thinks he had echocardiogram and told was normal.  I cannot view the results of the echocardiogram.  He was recently seen by cardiology here in town.  They reviewed records again conclusion the cardiac etiology of symptoms is unlikely.  This prompted referral here.  No change in dyspnea or timing differently for the day.  No seasonal or environmental changes to account for this.  No alleviating or exacerbating factors other than rest.  Breathing when at rest is normal.  He denies history of seasonal allergies.  He does note over the last month or so has had increased nasal congestion, watery eyes.  He has a history of iron deficiency anemia.  He was seen by hematology in [place] as well as locally.  He did receive an iron infusion a couple months ago.  Last hemoglobin was the in 12s, slightly low.  Not rechecked since 03/2020.  He is unsure if he had any chest imaging.  He assumed he had a chest x-ray but is unsure.  PMH: DM Surgical History: back surgery, hernia surgery  Family History:  HTN, CVA, CAD in mom, Dad with CVA Social History: former smoker, < 5 pack year by my calculations, retired Education officer, [employer] / Pulmonary Flowsheets:   ACT:  No flowsheet data found.  MMRC: mMRC Dyspnea Scale mMRC Score  08/05/2020 3    Epworth:  No flowsheet data found.  Tests:   FENO:  No results found for: NITRICOXIDE  PFT: PFT Results Latest Ref Rng & Units 09/30/2020  FVC-Pre L 3.85  FVC-Predicted Pre % 113  FVC-Post L 3.61  FVC-Predicted Post % 106  Pre FEV1/FVC % % 81  Post FEV1/FCV % % 83  FEV1-Pre L 3.13  FEV1-Predicted Pre % 131  FEV1-Post L 3.00  DLCO uncorrected ml/min/mmHg 24.08  DLCO UNC% % 112  DLCO corrected ml/min/mmHg 24.08  DLCO COR %Predicted % 112  DLVA Predicted % 102  TLC L 6.26  TLC % Predicted % 100  RV % Predicted % 96  Reviewed and interpreted as normal PFTs.  WALK:  No flowsheet data found.  Imaging: No results found.  Lab Results: Personally reviewed CBC    Component Value Date/Time   WBC 10.6 (H) 08/05/2020 1426   RBC 3.39 (L) 08/05/2020 1426   HGB 10.9 (L) 08/05/2020 1426   HGB 11.6 (L) 08/08/2019 1029   HGB 11.7 (L) 03/31/2014 1432   HCT 32.6 (L) 08/05/2020 1426   HCT 35.7 (L) 03/31/2014 1432   PLT 370.0 08/05/2020  1426   PLT 306 08/08/2019 1029   PLT 419 (H) 03/31/2014 1432   MCV 96.2 08/05/2020 1426   MCV 86.4 03/31/2014 1432   MCH 31.0 08/08/2019 1029   MCHC 33.5 08/05/2020 1426   RDW 13.2 08/05/2020 1426   RDW 15.5 (H) 03/31/2014 1432   LYMPHSABS 2.3 08/05/2020 1426   LYMPHSABS 3.7 (H) 03/31/2014 1432   MONOABS 0.7 08/05/2020 1426   MONOABS 1.0 (H) 03/31/2014 1432   EOSABS 0.1 08/05/2020 1426   EOSABS 0.1 03/31/2014 1432   BASOSABS 0.0 08/05/2020 1426   BASOSABS 0.0 03/31/2014 1432    BMET    Component Value Date/Time   NA 134 (L) 08/08/2019 1029   NA 134 (L) 03/31/2014 1432   K 4.3 08/08/2019 1029   K 4.6 03/31/2014 1432   CL 101 08/08/2019 1029   CO2 23 08/08/2019 1029   CO2 23  03/31/2014 1432   GLUCOSE 246 (H) 08/08/2019 1029   GLUCOSE 167 (H) 03/31/2014 1432   BUN 30 (H) 08/08/2019 1029   BUN 17.2 03/31/2014 1432   CREATININE 1.14 08/08/2019 1029   CREATININE 1.0 03/31/2014 1432   CALCIUM 9.1 08/08/2019 1029   CALCIUM 9.5 03/31/2014 1432   GFRNONAA >60 08/08/2019 1029   GFRAA >60 08/08/2019 1029    BNP No results found for: BNP  ProBNP No results found for: PROBNP  Specialty Problems   None  Allergies  Allergen Reactions   Bee Venom    Iodine    Oxycodone Nausea And Vomiting    Cold sweats, n/v and "out of it."    Immunization History  Administered Date(s) Administered   Influenza, High Dose Seasonal PF 01/27/2018   Influenza-Unspecified 02/10/2019, 01/28/2020   Moderna Sars-Covid-2 Vaccination 06/12/2019, 07/10/2019   Pneumococcal Polysaccharide-23 02/27/2016    Past Medical History:  Diagnosis Date   Arthritis    CAD (coronary artery disease)    mild (cath 2010)   Constipation 02/24/2014   DOE (dyspnea on exertion)    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   GERD (gastroesophageal reflux disease)    Herpes simplex conjunctivitis    Hyperlipidemia    Hypertension    Hyponatremia 02/24/2014   Iron deficiency anemia due to chronic blood loss 07/07/2019   Obesity    Right shoulder pain 02/24/2014   Type 2 diabetes mellitus ([place])    Unspecified deficiency anemia 02/16/2014   Weight loss 02/16/2014    Tobacco History: Social History   Tobacco Use  Smoking Status Former   Pack years: 0.00   Types: Cigarettes   Quit date: 04/02/1979   Years since quitting: 41.6  Smokeless Tobacco Never   Counseling given: Not Answered   Continue to not smoke  Outpatient Encounter Medications as of 11/18/2020  Medication Sig   ACCU-CHEK AVIVA PLUS test strip 1 strip by Other route 2 (two) times daily. Use 1 strip to check glucose twice a day   acyclovir (ZOVIRAX) 400 MG tablet Take 400 mg by mouth 2 (two) times daily.   APPLE CIDER VINEGAR  PO Take by mouth. 250 mg daily   aspirin 81 MG tablet Take 81 mg by mouth daily.   atorvastatin (LIPITOR) 40 MG tablet Take 40 mg by mouth daily.   Cholecalciferol (D3-50 PO) Take by mouth. Once daily   Dulaglutide (TRULICITY [place]) Inject into the skin.   finasteride (PROSCAR) 5 MG tablet Take 5 mg by mouth daily.   folic acid (FOLVITE) 1 MG tablet 1 tablet   Insulin Glargine [facility]  [place]) Inject 8 Units into the skin. 7 units daily   losartan (COZAAR) 50 MG tablet Take 50 mg by mouth daily.    metFORMIN (GLUCOPHAGE) 1000 MG tablet Take 1,000 mg by mouth 2 (two) times daily with a meal.   pantoprazole (PROTONIX) 40 MG tablet Take 40 mg by mouth daily.   tamsulosin (FLOMAX) 0.4 MG CAPS capsule Take 0.4 mg by mouth daily.   Thiamine HCl (VITAMIN B-1 PO) Take by mouth daily.   TURMERIC PO Take by mouth. 1000 mg daily   vitamin B-12 (CYANOCOBALAMIN) 1000 MCG tablet Take 1,000 mcg by mouth daily. Every other day   No facility-administered encounter medications on file as of 11/18/2020.     Review of Systems  Review of Systems  N/a  Physical Exam  BP 124/70   Pulse 73   Temp 98.7 F (37.1 C) (Oral)   Ht 5\' 6"  (1.676 m)   Wt 164 lb 12.8 oz (74.8 kg)   SpO2 95% Comment: on RA  BMI 26.60 kg/m   Wt Readings from Last 5 Encounters:  11/18/20 164 lb 12.8 oz (74.8 kg)  09/23/20 164 lb 12.8 oz (74.8 kg)  08/06/20 167 lb 6.4 oz (75.9 kg)  08/05/20 166 lb (75.3 kg)  05/05/20 165 lb (74.8 kg)    BMI Readings from Last 5 Encounters:  11/18/20 26.60 kg/m  09/23/20 26.60 kg/m  08/06/20 27.02 kg/m  08/05/20 26.79 kg/m  05/05/20 26.63 kg/m     Physical Exam General: Well-appearing, no acute distress Eyes: EOMI, no icterus Neck: Supple, no JVP Pulmonary: Clear aspiration bilaterally, no wheezes, no crackles noted Cardiovascular: Regular rhythm, no murmur Psych: Normal mood, full affect  Assessment & Plan:   Dyspnea on exertion: Unclear etiology.  Large cardiac  work-up reportedly normal.  Chest Xray clear. CT images from [place] clear per report. PFTs WNL. Low suspicion for pulmonary etiology. Suspect deconditioning. Will pursue CPET.    Return in about 3 months (around 02/18/2021).   Lanier Clam, MD 11/18/2020

## 2020-12-27 ENCOUNTER — Ambulatory Visit (HOSPITAL_COMMUNITY): Payer: Medicare Other | Attending: Cardiology

## 2020-12-27 ENCOUNTER — Other Ambulatory Visit: Payer: Self-pay

## 2020-12-27 DIAGNOSIS — R0600 Dyspnea, unspecified: Secondary | ICD-10-CM | POA: Diagnosis not present

## 2020-12-27 DIAGNOSIS — R0609 Other forms of dyspnea: Secondary | ICD-10-CM

## 2020-12-31 DIAGNOSIS — H25012 Cortical age-related cataract, left eye: Secondary | ICD-10-CM | POA: Diagnosis not present

## 2020-12-31 DIAGNOSIS — E113293 Type 2 diabetes mellitus with mild nonproliferative diabetic retinopathy without macular edema, bilateral: Secondary | ICD-10-CM | POA: Diagnosis not present

## 2020-12-31 DIAGNOSIS — H2512 Age-related nuclear cataract, left eye: Secondary | ICD-10-CM | POA: Diagnosis not present

## 2020-12-31 DIAGNOSIS — H1789 Other corneal scars and opacities: Secondary | ICD-10-CM | POA: Diagnosis not present

## 2021-01-03 DIAGNOSIS — H31093 Other chorioretinal scars, bilateral: Secondary | ICD-10-CM | POA: Diagnosis not present

## 2021-01-03 DIAGNOSIS — H35041 Retinal micro-aneurysms, unspecified, right eye: Secondary | ICD-10-CM | POA: Diagnosis not present

## 2021-01-03 DIAGNOSIS — H35351 Cystoid macular degeneration, right eye: Secondary | ICD-10-CM | POA: Diagnosis not present

## 2021-01-03 DIAGNOSIS — H35033 Hypertensive retinopathy, bilateral: Secondary | ICD-10-CM | POA: Diagnosis not present

## 2021-01-03 DIAGNOSIS — H3582 Retinal ischemia: Secondary | ICD-10-CM | POA: Diagnosis not present

## 2021-01-03 DIAGNOSIS — H3561 Retinal hemorrhage, right eye: Secondary | ICD-10-CM | POA: Diagnosis not present

## 2021-01-03 DIAGNOSIS — H353131 Nonexudative age-related macular degeneration, bilateral, early dry stage: Secondary | ICD-10-CM | POA: Diagnosis not present

## 2021-01-03 DIAGNOSIS — H35363 Drusen (degenerative) of macula, bilateral: Secondary | ICD-10-CM | POA: Diagnosis not present

## 2021-01-17 DIAGNOSIS — E7800 Pure hypercholesterolemia, unspecified: Secondary | ICD-10-CM | POA: Diagnosis not present

## 2021-01-17 DIAGNOSIS — E11319 Type 2 diabetes mellitus with unspecified diabetic retinopathy without macular edema: Secondary | ICD-10-CM | POA: Diagnosis not present

## 2021-01-19 DIAGNOSIS — E11319 Type 2 diabetes mellitus with unspecified diabetic retinopathy without macular edema: Secondary | ICD-10-CM | POA: Diagnosis not present

## 2021-01-19 DIAGNOSIS — Z794 Long term (current) use of insulin: Secondary | ICD-10-CM | POA: Diagnosis not present

## 2021-01-19 DIAGNOSIS — R5382 Chronic fatigue, unspecified: Secondary | ICD-10-CM | POA: Diagnosis not present

## 2021-01-19 DIAGNOSIS — E7800 Pure hypercholesterolemia, unspecified: Secondary | ICD-10-CM | POA: Diagnosis not present

## 2021-01-19 DIAGNOSIS — D649 Anemia, unspecified: Secondary | ICD-10-CM | POA: Diagnosis not present

## 2021-01-19 DIAGNOSIS — R42 Dizziness and giddiness: Secondary | ICD-10-CM | POA: Diagnosis not present

## 2021-01-19 DIAGNOSIS — E785 Hyperlipidemia, unspecified: Secondary | ICD-10-CM | POA: Diagnosis not present

## 2021-02-02 DIAGNOSIS — R5382 Chronic fatigue, unspecified: Secondary | ICD-10-CM | POA: Diagnosis not present

## 2021-02-02 DIAGNOSIS — E7800 Pure hypercholesterolemia, unspecified: Secondary | ICD-10-CM | POA: Diagnosis not present

## 2021-02-02 DIAGNOSIS — R42 Dizziness and giddiness: Secondary | ICD-10-CM | POA: Diagnosis not present

## 2021-02-02 DIAGNOSIS — E11319 Type 2 diabetes mellitus with unspecified diabetic retinopathy without macular edema: Secondary | ICD-10-CM | POA: Diagnosis not present

## 2021-02-02 DIAGNOSIS — Z794 Long term (current) use of insulin: Secondary | ICD-10-CM | POA: Diagnosis not present

## 2021-02-02 DIAGNOSIS — D649 Anemia, unspecified: Secondary | ICD-10-CM | POA: Diagnosis not present

## 2021-02-02 DIAGNOSIS — I10 Essential (primary) hypertension: Secondary | ICD-10-CM | POA: Diagnosis not present

## 2021-02-08 DIAGNOSIS — R5382 Chronic fatigue, unspecified: Secondary | ICD-10-CM | POA: Diagnosis not present

## 2021-02-08 DIAGNOSIS — D649 Anemia, unspecified: Secondary | ICD-10-CM | POA: Diagnosis not present

## 2021-02-11 ENCOUNTER — Ambulatory Visit (INDEPENDENT_AMBULATORY_CARE_PROVIDER_SITE_OTHER): Payer: Medicare Other | Admitting: Podiatry

## 2021-02-11 ENCOUNTER — Encounter: Payer: Self-pay | Admitting: Podiatry

## 2021-02-11 ENCOUNTER — Other Ambulatory Visit: Payer: Self-pay

## 2021-02-11 DIAGNOSIS — L600 Ingrowing nail: Secondary | ICD-10-CM

## 2021-02-11 DIAGNOSIS — M720 Palmar fascial fibromatosis [Dupuytren]: Secondary | ICD-10-CM | POA: Insufficient documentation

## 2021-02-11 DIAGNOSIS — R5382 Chronic fatigue, unspecified: Secondary | ICD-10-CM | POA: Insufficient documentation

## 2021-02-11 DIAGNOSIS — E222 Syndrome of inappropriate secretion of antidiuretic hormone: Secondary | ICD-10-CM | POA: Insufficient documentation

## 2021-02-11 DIAGNOSIS — Z91038 Other insect allergy status: Secondary | ICD-10-CM | POA: Insufficient documentation

## 2021-02-11 DIAGNOSIS — R5381 Other malaise: Secondary | ICD-10-CM | POA: Insufficient documentation

## 2021-02-11 DIAGNOSIS — Z0001 Encounter for general adult medical examination with abnormal findings: Secondary | ICD-10-CM | POA: Insufficient documentation

## 2021-02-11 DIAGNOSIS — Z7982 Long term (current) use of aspirin: Secondary | ICD-10-CM | POA: Insufficient documentation

## 2021-02-11 DIAGNOSIS — R609 Edema, unspecified: Secondary | ICD-10-CM | POA: Insufficient documentation

## 2021-02-11 DIAGNOSIS — N400 Enlarged prostate without lower urinary tract symptoms: Secondary | ICD-10-CM | POA: Insufficient documentation

## 2021-02-11 DIAGNOSIS — G9332 Myalgic encephalomyelitis/chronic fatigue syndrome: Secondary | ICD-10-CM | POA: Insufficient documentation

## 2021-02-11 DIAGNOSIS — M7062 Trochanteric bursitis, left hip: Secondary | ICD-10-CM | POA: Insufficient documentation

## 2021-02-11 DIAGNOSIS — Z794 Long term (current) use of insulin: Secondary | ICD-10-CM | POA: Insufficient documentation

## 2021-02-11 DIAGNOSIS — E11319 Type 2 diabetes mellitus with unspecified diabetic retinopathy without macular edema: Secondary | ICD-10-CM | POA: Insufficient documentation

## 2021-02-11 DIAGNOSIS — R0609 Other forms of dyspnea: Secondary | ICD-10-CM | POA: Diagnosis not present

## 2021-02-11 DIAGNOSIS — Z461 Encounter for fitting and adjustment of hearing aid: Secondary | ICD-10-CM | POA: Insufficient documentation

## 2021-02-11 DIAGNOSIS — E7800 Pure hypercholesterolemia, unspecified: Secondary | ICD-10-CM | POA: Insufficient documentation

## 2021-02-11 DIAGNOSIS — Z6827 Body mass index (BMI) 27.0-27.9, adult: Secondary | ICD-10-CM | POA: Insufficient documentation

## 2021-02-11 DIAGNOSIS — K219 Gastro-esophageal reflux disease without esophagitis: Secondary | ICD-10-CM | POA: Insufficient documentation

## 2021-02-11 DIAGNOSIS — R251 Tremor, unspecified: Secondary | ICD-10-CM | POA: Insufficient documentation

## 2021-02-11 DIAGNOSIS — E785 Hyperlipidemia, unspecified: Secondary | ICD-10-CM | POA: Insufficient documentation

## 2021-02-11 DIAGNOSIS — I6529 Occlusion and stenosis of unspecified carotid artery: Secondary | ICD-10-CM | POA: Insufficient documentation

## 2021-02-11 DIAGNOSIS — Z85828 Personal history of other malignant neoplasm of skin: Secondary | ICD-10-CM | POA: Insufficient documentation

## 2021-02-11 DIAGNOSIS — R269 Unspecified abnormalities of gait and mobility: Secondary | ICD-10-CM | POA: Insufficient documentation

## 2021-02-11 DIAGNOSIS — H9190 Unspecified hearing loss, unspecified ear: Secondary | ICD-10-CM | POA: Insufficient documentation

## 2021-02-11 DIAGNOSIS — R0600 Dyspnea, unspecified: Secondary | ICD-10-CM | POA: Insufficient documentation

## 2021-02-11 DIAGNOSIS — Z820 Family history of epilepsy and other diseases of the nervous system: Secondary | ICD-10-CM | POA: Insufficient documentation

## 2021-02-11 NOTE — Patient Instructions (Signed)

## 2021-02-11 NOTE — Progress Notes (Signed)
Subjective:   Patient ID: Dustin Mccarthy, male   DOB: 80 y.o.   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  States he tries to trim and soak it and not able to do that and overall in good health with A1c running generally below 7 with his last one being 7.4   Review of Systems  All other systems reviewed and are negative.      Objective:  Physical Exam Vitals and nursing note reviewed.  Constitutional:      Appearance: He is well-developed.  Pulmonary:     Effort: Pulmonary effort is normal.  Musculoskeletal:        General: Normal range of motion.  Skin:    General: Skin is warm.  Neurological:     Mental Status: He is alert.    Neurovascular status intact muscle strength was found to be adequate range of motion adequate.  Patient is found to have an incurvated left hallux medial border painful when pressed with low-grade redness no active drainage noted and pain with palpation.  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  Patient wants procedure understanding risk and today I infiltrated the left hallux 60 mg like Marcaine mixture sterile prep done and using sterile instrumentation I removed the medial border exposed matrix applied phenol 3 applications 30 seconds followed by alcohol lavage sterile dressing and gave instructions on soaks.  Patient will leave dressing on 24 hours but take it off earlier if any throbbing were to occur and is encouraged to call with questions concerns which may arise with this condition

## 2021-02-15 DIAGNOSIS — Z23 Encounter for immunization: Secondary | ICD-10-CM | POA: Diagnosis not present

## 2021-02-23 ENCOUNTER — Encounter: Payer: Self-pay | Admitting: Pulmonary Disease

## 2021-02-23 ENCOUNTER — Other Ambulatory Visit: Payer: Self-pay

## 2021-02-23 ENCOUNTER — Ambulatory Visit (INDEPENDENT_AMBULATORY_CARE_PROVIDER_SITE_OTHER): Payer: Medicare Other | Admitting: Pulmonary Disease

## 2021-02-23 VITALS — BP 120/60 | HR 64 | Temp 98.1°F | Ht 66.0 in | Wt 163.4 lb

## 2021-02-23 DIAGNOSIS — R0600 Dyspnea, unspecified: Secondary | ICD-10-CM | POA: Diagnosis not present

## 2021-02-23 DIAGNOSIS — R0609 Other forms of dyspnea: Secondary | ICD-10-CM

## 2021-02-23 NOTE — Progress Notes (Signed)
@Patient  ID: Dustin Mccarthy, male    DOB: Jan 28, 1941, 80 y.o.   MRN: [id number]  Chief complaint: Dyspnea on exertion   Referring provider: Deland Pretty, MD  HPI:   80 y.o. whom we are seeing in follow up for DOE.  Doing ok. Symptoms stable. No real improvement or worsening. PFT again reviewed today. Spirometry normal, no BD response, TLC WNL, DLCO WNL.  In the interim since last visit, CPET was performed.  This was normal.  Possible suggestion of exercise-induced pulmonary hypertension versus hypoventilation.  Otherwise normal.  Still perplexed as to why he has symptoms.  Discussed role of cross-sectional imaging, no CT done here.  Consider high-res CT in the future.  Can consider inhaler therapy.  Discussed again this is unlikely to be helpful.  Discussed his PCP wants him to do physical therapy.  I agreed that increasing physical activity will be the most helpful intervention.  HPI at initial visit: Patient had onset this nonexertional last spring.  Notes severe went on trip to [place].  Reports walking just several feet feel dyspnea on exertion.  Worse with inclines or stairs.  Overall stable.  No improvement over several months.  While he was in [place] he had a full cardiac work-up including cardiac catheterization, stress test.  This reported normal.  He thinks he had echocardiogram and told was normal.  I cannot view the results of the echocardiogram.  He was recently seen by cardiology here in town.  They reviewed records again conclusion the cardiac etiology of symptoms is unlikely.  This prompted referral here.  No change in dyspnea or timing differently for the day.  No seasonal or environmental changes to account for this.  No alleviating or exacerbating factors other than rest.  Breathing when at rest is normal.  He denies history of seasonal allergies.  He does note over the last month or so has had increased nasal congestion, watery eyes.  He has a history of iron deficiency  anemia.  He was seen by hematology in [place] as well as locally.  He did receive an iron infusion a couple months ago.  Last hemoglobin was the in 12s, slightly low.  Not rechecked since 03/2020.  He is unsure if he had any chest imaging.  He assumed he had a chest x-ray but is unsure.  PMH: DM Surgical History: back surgery, hernia surgery  Family History: HTN, CVA, CAD in mom, Dad with CVA Social History: former smoker, < 5 pack year by my calculations, retired Education officer, museum / Pulmonary Flowsheets:   ACT:  No flowsheet data found.  MMRC: mMRC Dyspnea Scale mMRC Score  08/05/2020 3    Epworth:  No flowsheet data found.  Tests:   FENO:  No results found for: NITRICOXIDE  PFT: PFT Results Latest Ref Rng & Units 09/30/2020  FVC-Pre L 3.85  FVC-Predicted Pre % 113  FVC-Post L 3.61  FVC-Predicted Post % 106  Pre FEV1/FVC % % 81  Post FEV1/FCV % % 83  FEV1-Pre L 3.13  FEV1-Predicted Pre % 131  FEV1-Post L 3.00  DLCO uncorrected ml/min/mmHg 24.08  DLCO UNC% % 112  DLCO corrected ml/min/mmHg 24.08  DLCO COR %Predicted % 112  DLVA Predicted % 102  TLC L 6.26  TLC % Predicted % 100  RV % Predicted % 96  Reviewed and interpreted as normal PFTs.  WALK:  No flowsheet data found.  Imaging: No results found.  Lab Results: Personally reviewed CBC  Component Value Date/Time   WBC 10.5 11/18/2020 1135   RBC 3.65 (L) 11/18/2020 1135   HGB 11.5 (L) 11/18/2020 1135   HGB 11.6 (L) 08/08/2019 1029   HGB 11.7 (L) 03/31/2014 1432   HCT 34.5 (L) 11/18/2020 1135   HCT 35.7 (L) 03/31/2014 1432   PLT 323.0 11/18/2020 1135   PLT 306 08/08/2019 1029   PLT 419 (H) 03/31/2014 1432   MCV 94.5 11/18/2020 1135   MCV 86.4 03/31/2014 1432   MCH 31.0 08/08/2019 1029   MCHC 33.4 11/18/2020 1135   RDW 13.3 11/18/2020 1135   RDW 15.5 (H) 03/31/2014 1432   LYMPHSABS 2.2 11/18/2020 1135   LYMPHSABS 3.7 (H) 03/31/2014 1432   MONOABS 0.8 11/18/2020 1135   MONOABS  1.0 (H) 03/31/2014 1432   EOSABS 0.0 11/18/2020 1135   EOSABS 0.1 03/31/2014 1432   BASOSABS 0.1 11/18/2020 1135   BASOSABS 0.0 03/31/2014 1432    BMET    Component Value Date/Time   NA 134 (L) 08/08/2019 1029   NA 134 (L) 03/31/2014 1432   K 4.3 08/08/2019 1029   K 4.6 03/31/2014 1432   CL 101 08/08/2019 1029   CO2 23 08/08/2019 1029   CO2 23 03/31/2014 1432   GLUCOSE 246 (H) 08/08/2019 1029   GLUCOSE 167 (H) 03/31/2014 1432   BUN 30 (H) 08/08/2019 1029   BUN 17.2 03/31/2014 1432   CREATININE 1.14 08/08/2019 1029   CREATININE 1.0 03/31/2014 1432   CALCIUM 9.1 08/08/2019 1029   CALCIUM 9.5 03/31/2014 1432   GFRNONAA >60 08/08/2019 1029   GFRAA >60 08/08/2019 1029    BNP No results found for: BNP  ProBNP No results found for: PROBNP  Specialty Problems       Pulmonary Problems   Globus pharyngeus   Dyspnea on exertion    Allergies  Allergen Reactions   Bee Venom Other (See Comments)   Hydrochlorothiazide Other (See Comments)   Iodine    Oxycodone Nausea And Vomiting    Cold sweats, n/v and "out of it."   Povidone Iodine Other (See Comments)   Iodinated Diagnostic Agents Rash    Immunization History  Administered Date(s) Administered   Influenza, High Dose Seasonal PF 01/27/2018   Influenza-Unspecified 02/10/2019, 01/28/2020   Moderna Sars-Covid-2 Vaccination 06/12/2019, 07/10/2019   Pneumococcal Polysaccharide-23 02/27/2016    Past Medical History:  Diagnosis Date   Arthritis    CAD (coronary artery disease)    mild (cath 2010)   Constipation 02/24/2014   DOE (dyspnea on exertion)    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   GERD (gastroesophageal reflux disease)    Herpes simplex conjunctivitis    Hyperlipidemia    Hypertension    Hyponatremia 02/24/2014   Iron deficiency anemia due to chronic blood loss 07/07/2019   Obesity    Right shoulder pain 02/24/2014   Type 2 diabetes mellitus ([place])    Unspecified deficiency anemia 02/16/2014    Weight loss 02/16/2014    Tobacco History: Social History   Tobacco Use  Smoking Status Former   Types: Cigarettes   Quit date: 04/02/1979   Years since quitting: 41.9  Smokeless Tobacco Never   Counseling given: Not Answered   Continue to not smoke  Outpatient Encounter Medications as of 02/23/2021  Medication Sig   APPLE CIDER VINEGAR PO Take by mouth. 250 mg daily   aspirin 81 MG chewable tablet CHEW ONE TABLET BY MOUTH DAILY   Cholecalciferol (D3-50 PO) Take by mouth. Once daily   Dulaglutide (  TRULICITY [place]) Inject into the skin.   ferrous sulfate 325 (65 FE) MG tablet 1 tablet   finasteride (PROSCAR) 5 MG tablet Take 5 mg by mouth daily.   folic acid (FOLVITE) 1 MG tablet 1 tablet   Insulin Glargine (BASAGLAR KWIKPEN [place]) Inject 8 Units into the skin. 7 units daily   metFORMIN (GLUCOPHAGE) 1000 MG tablet Take 1,000 mg by mouth 2 (two) times daily with a meal.   olmesartan (BENICAR) 20 MG tablet 1 tablet   pantoprazole (PROTONIX) 40 MG tablet Take 40 mg by mouth daily.   rosuvastatin (CRESTOR) 20 MG tablet 1 tablet   tamsulosin (FLOMAX) 0.4 MG CAPS capsule Take 0.4 mg by mouth daily.   Thiamine HCl (VITAMIN B-1 PO) Take by mouth daily.   TURMERIC PO Take by mouth. 1000 mg daily   vitamin B-12 (CYANOCOBALAMIN) 1000 MCG tablet Take 1,000 mcg by mouth daily. Every other day   ACCU-CHEK AVIVA PLUS test strip 1 strip by Other route 2 (two) times daily. Use 1 strip to check glucose twice a day   acyclovir (ZOVIRAX) 400 MG tablet Take 400 mg by mouth 2 (two) times daily.   acyclovir (ZOVIRAX) 800 MG tablet Take 0.5 tablets by mouth 2 (two) times daily. (Patient not taking: Reported on 02/23/2021)   atorvastatin (LIPITOR) 40 MG tablet Take 40 mg by mouth daily. (Patient not taking: Reported on 02/23/2021)   Cholecalciferol 50 MCG (2000 UT) TABS Take 1 tablet by mouth daily. (Patient not taking: Reported on 02/23/2021)   losartan (COZAAR) 100 MG tablet Take 0.5 tablets by mouth daily.  (Patient not taking: Reported on 02/23/2021)   [DISCONTINUED] aspirin 81 MG tablet Take 81 mg by mouth daily.   [DISCONTINUED] atorvastatin (LIPITOR) 40 MG tablet Take 1 tablet by mouth at bedtime.   [DISCONTINUED] finasteride (PROSCAR) 5 MG tablet Take 1 tablet by mouth daily.   [DISCONTINUED] losartan (COZAAR) 50 MG tablet Take 50 mg by mouth daily.    [DISCONTINUED] metFORMIN (GLUCOPHAGE) 1000 MG tablet Take 1 tablet by mouth 2 (two) times daily.   [DISCONTINUED] vitamin B-12 (CYANOCOBALAMIN) 500 MCG tablet TAKE TWO TABLETS BY MOUTH EVERY OTHER DAY   No facility-administered encounter medications on file as of 02/23/2021.     Review of Systems  Review of Systems  N/a  Physical Exam  BP 120/60 (BP Location: Right Arm, Cuff Size: Normal)   Pulse 64   Temp 98.1 F (36.7 C) (Oral)   Ht 5\' 6"  (1.676 m)   Wt 163 lb 6.4 oz (74.1 kg)   SpO2 98%   BMI 26.37 kg/m   Wt Readings from Last 5 Encounters:  02/23/21 163 lb 6.4 oz (74.1 kg)  11/18/20 164 lb 12.8 oz (74.8 kg)  09/23/20 164 lb 12.8 oz (74.8 kg)  08/06/20 167 lb 6.4 oz (75.9 kg)  08/05/20 166 lb (75.3 kg)    BMI Readings from Last 5 Encounters:  02/23/21 26.37 kg/m  11/18/20 26.60 kg/m  09/23/20 26.60 kg/m  08/06/20 27.02 kg/m  08/05/20 26.79 kg/m     Physical Exam General: Well-appearing, no acute distress Eyes: EOMI, no icterus Neck: Supple, no JVP Pulmonary: Clear to auscultation bilaterally, no wheezes, no crackles noted Cardiovascular: Regular rhythm, no murmur Psych: Normal mood, full affect  Assessment & Plan:   Dyspnea on exertion: Unclear etiology.  Large cardiac work-up reportedly normal.  Chest Xray clear. CT images from [place] clear per report. PFTs WNL.  CPET normal.  Low suspicion for pulmonary etiology. Suspect deconditioning.  Consider repeat high-res CT in the future.  No pulmonary cause of symptoms.   Return in about 3 months (around 05/25/2021).   Lanier Clam,  MD 02/23/2021 I spent 32 minutes in the care of the patient including face-to-face visit, review of records, coordination of care.

## 2021-02-23 NOTE — Patient Instructions (Signed)
Nice to see you again  I agree with Dr. Shelia Media, physical therapy or increase in exercise or activity will likely help.  If you find this not to be helpful, please contact our office.  I will order a CT scan and we can try an inhaler at that time.

## 2021-03-08 DIAGNOSIS — R262 Difficulty in walking, not elsewhere classified: Secondary | ICD-10-CM | POA: Diagnosis not present

## 2021-03-08 DIAGNOSIS — M6281 Muscle weakness (generalized): Secondary | ICD-10-CM | POA: Diagnosis not present

## 2021-03-08 DIAGNOSIS — R42 Dizziness and giddiness: Secondary | ICD-10-CM | POA: Diagnosis not present

## 2021-03-08 DIAGNOSIS — R2681 Unsteadiness on feet: Secondary | ICD-10-CM | POA: Diagnosis not present

## 2021-03-30 DIAGNOSIS — H35341 Macular cyst, hole, or pseudohole, right eye: Secondary | ICD-10-CM | POA: Diagnosis not present

## 2021-03-30 DIAGNOSIS — E113292 Type 2 diabetes mellitus with mild nonproliferative diabetic retinopathy without macular edema, left eye: Secondary | ICD-10-CM | POA: Diagnosis not present

## 2021-03-30 DIAGNOSIS — E113211 Type 2 diabetes mellitus with mild nonproliferative diabetic retinopathy with macular edema, right eye: Secondary | ICD-10-CM | POA: Diagnosis not present

## 2021-03-30 DIAGNOSIS — H35033 Hypertensive retinopathy, bilateral: Secondary | ICD-10-CM | POA: Diagnosis not present

## 2021-04-06 DIAGNOSIS — R42 Dizziness and giddiness: Secondary | ICD-10-CM | POA: Diagnosis not present

## 2021-04-06 DIAGNOSIS — M6281 Muscle weakness (generalized): Secondary | ICD-10-CM | POA: Diagnosis not present

## 2021-04-06 DIAGNOSIS — R262 Difficulty in walking, not elsewhere classified: Secondary | ICD-10-CM | POA: Diagnosis not present

## 2021-04-06 DIAGNOSIS — R2681 Unsteadiness on feet: Secondary | ICD-10-CM | POA: Diagnosis not present

## 2021-04-08 DIAGNOSIS — N401 Enlarged prostate with lower urinary tract symptoms: Secondary | ICD-10-CM | POA: Diagnosis not present

## 2021-04-08 DIAGNOSIS — R3912 Poor urinary stream: Secondary | ICD-10-CM | POA: Diagnosis not present

## 2021-04-18 DIAGNOSIS — L300 Nummular dermatitis: Secondary | ICD-10-CM | POA: Diagnosis not present

## 2021-04-18 DIAGNOSIS — L821 Other seborrheic keratosis: Secondary | ICD-10-CM | POA: Diagnosis not present

## 2021-04-18 DIAGNOSIS — L570 Actinic keratosis: Secondary | ICD-10-CM | POA: Diagnosis not present

## 2021-04-18 DIAGNOSIS — H61001 Unspecified perichondritis of right external ear: Secondary | ICD-10-CM | POA: Diagnosis not present

## 2021-04-18 DIAGNOSIS — L812 Freckles: Secondary | ICD-10-CM | POA: Diagnosis not present

## 2021-04-18 DIAGNOSIS — Z85828 Personal history of other malignant neoplasm of skin: Secondary | ICD-10-CM | POA: Diagnosis not present

## 2021-04-19 DIAGNOSIS — D649 Anemia, unspecified: Secondary | ICD-10-CM | POA: Diagnosis not present

## 2021-04-19 DIAGNOSIS — E11319 Type 2 diabetes mellitus with unspecified diabetic retinopathy without macular edema: Secondary | ICD-10-CM | POA: Diagnosis not present

## 2021-04-19 DIAGNOSIS — R42 Dizziness and giddiness: Secondary | ICD-10-CM | POA: Diagnosis not present

## 2021-04-19 DIAGNOSIS — E7800 Pure hypercholesterolemia, unspecified: Secondary | ICD-10-CM | POA: Diagnosis not present

## 2021-04-19 DIAGNOSIS — I6529 Occlusion and stenosis of unspecified carotid artery: Secondary | ICD-10-CM | POA: Diagnosis not present

## 2021-04-19 DIAGNOSIS — I10 Essential (primary) hypertension: Secondary | ICD-10-CM | POA: Diagnosis not present

## 2021-04-19 DIAGNOSIS — Z794 Long term (current) use of insulin: Secondary | ICD-10-CM | POA: Diagnosis not present

## 2021-04-28 ENCOUNTER — Encounter: Payer: Self-pay | Admitting: Internal Medicine

## 2021-04-28 ENCOUNTER — Ambulatory Visit (INDEPENDENT_AMBULATORY_CARE_PROVIDER_SITE_OTHER): Payer: Medicare Other | Admitting: Internal Medicine

## 2021-04-28 ENCOUNTER — Other Ambulatory Visit: Payer: Self-pay

## 2021-04-28 VITALS — BP 130/62 | HR 72 | Ht 66.0 in | Wt 161.6 lb

## 2021-04-28 DIAGNOSIS — R0600 Dyspnea, unspecified: Secondary | ICD-10-CM

## 2021-04-28 DIAGNOSIS — I5189 Other ill-defined heart diseases: Secondary | ICD-10-CM | POA: Diagnosis not present

## 2021-04-28 DIAGNOSIS — I2510 Atherosclerotic heart disease of native coronary artery without angina pectoris: Secondary | ICD-10-CM | POA: Diagnosis not present

## 2021-04-28 NOTE — Progress Notes (Signed)
OFFICE NOTE  Chief Complaint:  Dyspnea with exertion  Primary Care Physician: Deland Pretty, MD  HPI:  Dustin Mccarthy  Is a 80 year old gentleman who has diabetes type 2, hypertension, dyslipidemia, and obesity. He is on insulin therapy and had a heart catheterization in 2010, which was negative, because of an abnormal stress test. He has also recently had some worsening shortness of breath and difficulty with weight loss. I recommended a metabolic test to further evaluate his shortness of breath. That was performed on March 12, 2012. It showed an RER of 1.06, a peak VO2 of 84% predicted. Heart rate was 87% predicted and the heart rate and VO2 curve showed a good agreement until anaerobic threshold with some flattening of his VO2 curve suggestive of an ischemic response. The VO2, however, is high enough and greater than 60, typically a cutoff for underlying coronary disease. This is a low-risk study and suggests small vessel ischemia. I recommended aerobic exercise and adding l-arginine 3 g p.o. t.i.d. to the diet. He actually did both of these things and reported some marked improvement in his shortness of breath. He did gardening for about 6 months and discontinued it. His exercise is ongoing he is managed to lose 6-8 pounds. He says he feels better and is not bothered by shortness of breath. He been followed closely by Sandi Mariscal (PharmD) at Dr. Peterson [place] office. He reports that his diabetes is fairly well controlled with an A1c of 6.9. He is cholesterol is also at goal.  Dustin Mccarthy returns today for follow-up. Recently he's been having some issues after he returned from a long visit in [place]. He has been noted to have high platelets, and elevated white blood cell count and anemia. He's been seen by hematology and they are fairly convinced he does not have a hematologic malignancy. He is also being seen by infectious diseases for possible exposure related abnormalities in his blood work. He  denies any chest pain or worsening shortness of breath but does have significant fatigue. He denies any fevers, chills or constitutional symptoms. He did have a lipid profile in September which showed total cholesterol of 96, triglycerides 57, HDL 35 and LDL of 50.  Dustin Mccarthy returns today for follow-up. Overall he is doing well denies any chest pain or worsening shortness of breath. Blood pressure was mildly elevated at 144/66 however recheck was 132/64. He says recently his blood pressure may be running slightly higher. I've asked him to follow this at home and talk with his primary care provider when he sees him in follow-up about it. A1c is 6.9 and he maintains on insulin and metformin. Cholesterol was recently checked and at goal. He denies any chest pain or worsening shortness of breath.  05/12/2016  Dustin Mccarthy returns today for follow-up. He denies a chest pain worsening shortness of breath. He has some mild sock line edema. Surprisingly his hemoglobin A1c has gone up despite medicine changes. His weight is actually down 17 pounds since I last saw him. He is on J[place] in addition to [place] and metformin. Blood pressure is well-controlled today.  04/04/2017  Dustin Mccarthy returns today for follow-up.  Over the last year he is done fairly well.  He denies any worsening chest pain or shortness of breath.  Hemoglobin A1c recently was 6.8.  His cholesterols been well controlled.  He is on a long-acting insulin in addition to oral medications for his diabetes.  He has had some hearing loss and now uses  hearing aids.  He denies any specific activity but does say he feels better when he does some work in the yard.  11/30/2017  Dustin Mccarthy returns today for follow-up.  He is done well well over the past year.  Hemoglobin A1c slightly high at 7.1 but hovers around 7.  He is been put on [place] and Toujeo.  The Toujeo does seem to have helped his blood sugars.  He is required less insulin on Glyxambi.   His blood sugars are now much lower.  Hopefully his more recent A1c will be even lower.  Cholesterol is at goal with most recent lipids this past August showed total cholesterol 103, HDL 34, LDL 48 and triglycerides 106.  He denies any chest pain or shortness of breath.  He is not as physically active as we would like him to be but is committed to starting some exercise and walking.  Weight is actually down about 10 pounds since last year.  04/02/2019  Dustin Mccarthy is seen today in follow-up.  Overall he is doing well and has no complaints.  He denies any chest pain or worsening shortness of breath.  His diabetes is well controlled.  LDL has been at target less than 70.  His blood pressure is also excellent today.  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  05/05/2020  Dustin Mccarthy returns for follow-up.  This is an annual visit although recently he was here with his wife.  Both of them had spent an extended period time out in [place] visiting with her daughter who was in a cardiac nurse.  He had had some difficulty with shortness of breath particular walking through the airport and had a history of anemia.  Ultimately established with a PCP there and was seen by a hematologist by his report.  He underwent multiple studies including CT scans and blood work and ultimately was placed on some iron.  Symptoms however had not improved and then he was referred to cardiology, being seen by Dr. Christa See with [facility]).  It sounds like by his report he had an echocardiogram, a cardiac PET and ultimately cardiac catheterization.  We have requested those records.  From what he tells me he was not found to have any obstructive coronary disease.  Today in follow-up he is noted to be slightly hypertensive.  This is been a more consistent trend which she says is similar to his blood pressure numbers at home.  He also has had some lower extremity edema.  EKG today shows sinus rhythm with  first-degree AV block at 63.  Lipids are well controlled with an LDL 45, total cholesterol 104, HDL 37 and triglycerides 99 (03/2020).  08/06/2020  Dustin Mccarthy returns today for follow-up.  Unfortunately I tried him on hydrochlorothiazide for some additional blood pressure lowering and edema.  He said the swelling did not really change but he was getting very dizzy and basically almost fell out of a chair at his desk twice.  His daughter thought it was likely the HCTZ and stopped it and his symptoms improved.  He still struggling with shortness of breath which also did not improve with the diuretic.  He can walk a small distance and gets short of breath and notes tachycardia.  He was referred to pulmonary and was seen just a few days ago.  No acute cardiopulmonary disease.  I personally felt that the right hemidiaphragm may be a little elevated.  He does have follow-up with  pulmonary.  He did have an extensive work-up in [place] including cardiac catheterization and PET as well as an echocardiogram all of which were not particularly revealing.  04/28/2021  Dustin Mccarthy is seen today in follow-up.  He was seen by Dr. Silas Flood over the summer.  He underwent cardiopulmonary exercise testing.  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  I suspect he might have diastolic dysfunction as a reason for why he gets short of breath.  He does have significant calcific coronary artery disease however was found to have no significant obstruction by cath in August of last year.  He had numerous tests that were done.  He denies any angina.  His PCP sent him for rehabilitation and physical therapy and he says that doing the exercises and being more active has helped his shortness of breath.  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   GERD (gastroesophageal reflux disease)    Herpes simplex conjunctivitis    Hyperlipidemia    Hypertension    Hyponatremia 02/24/2014   Iron deficiency anemia due to chronic blood loss 07/07/2019   Obesity    Right shoulder pain 02/24/2014   Type 2 diabetes mellitus ([place])    Unspecified deficiency anemia 02/16/2014   Weight loss 02/16/2014    Past Surgical History:  Procedure Laterality Date   [place]  2010   after abnormal stress test (03/2009) - mild coronary disease   Cardiometabolic Testing  [id number]/5277   RER of 1.06, peak VO2 84% predicted; HR 87% predicted   HERNIA REPAIR     Skull Fracture Surgery  1945   TRANSTHORACIC ECHOCARDIOGRAM  03/2009   EF 45-50%, mild conc LVH, mod septal hypokinesis, mod apical wall hypokinesis; RV systolic function borderline reduced; trace MR/TR    FAMHx:  Family History  Problem Relation Age of Onset   Hypertension Mother    Diabetes Mother    Heart attack Mother    Stroke Mother    Parkinson's disease Father    Heart disease Father    Diabetes Child    Cancer Paternal Uncle        throat ca   Colon cancer Neg Hx    Esophageal cancer Neg Hx    Stomach cancer Neg Hx    Pancreatic cancer Neg Hx     SOCHx:   reports that he quit smoking about 42 years ago. His smoking use included cigarettes. He has never used smokeless tobacco. He reports current alcohol use of about 3.0 - 4.0 standard drinks per week. He reports that he does not use drugs.  ALLERGIES:  Allergies  Allergen Reactions   Bee Venom Other (See Comments)   Hydrochlorothiazide Other (See Comments)   Iodine    Oxycodone Nausea And Vomiting    Cold sweats, n/v and "out of it."   Povidone Iodine Other (See Comments)   Iodinated Diagnostic Agents Rash    ROS: Pertinent items noted in HPI and remainder of comprehensive ROS otherwise negative.  HOME MEDS: Current Outpatient Medications   Medication Sig Dispense Refill   ACCU-CHEK AVIVA PLUS test strip 1 strip by Other route 2 (two) times daily. Use 1 strip to check glucose twice a day  0   acyclovir (ZOVIRAX)  400 MG tablet Take 400 mg by mouth 2 (two) times daily.     acyclovir (ZOVIRAX) 800 MG tablet Take 0.5 tablets by mouth 2 (two) times daily.     APPLE CIDER VINEGAR PO Take by mouth. 250 mg daily     aspirin 81 MG chewable tablet CHEW ONE TABLET BY MOUTH DAILY     atorvastatin (LIPITOR) 40 MG tablet Take 40 mg by mouth daily.     Cholecalciferol (D3-50 PO) Take by mouth. Once daily     Cholecalciferol 50 MCG (2000 UT) TABS Take 1 tablet by mouth daily.     Dulaglutide (TRULICITY [place]) Inject into the skin.     ferrous sulfate 325 (65 FE) MG tablet 1 tablet     finasteride (PROSCAR) 5 MG tablet Take 5 mg by mouth daily.     folic acid (FOLVITE) 1 MG tablet 1 tablet     Insulin Glargine (BASAGLAR KWIKPEN [place]) Inject 8 Units into the skin. 7 units daily     losartan (COZAAR) 100 MG tablet Take 0.5 tablets by mouth daily.     metFORMIN (GLUCOPHAGE) 1000 MG tablet Take 1,000 mg by mouth 2 (two) times daily with a meal.     olmesartan (BENICAR) 20 MG tablet 1 tablet     pantoprazole (PROTONIX) 40 MG tablet Take 40 mg by mouth daily.     rosuvastatin (CRESTOR) 20 MG tablet 1 tablet     tamsulosin (FLOMAX) 0.4 MG CAPS capsule Take 0.4 mg by mouth daily.     Thiamine HCl (VITAMIN B-1 PO) Take by mouth daily.     TURMERIC PO Take by mouth. 1000 mg daily     vitamin B-12 (CYANOCOBALAMIN) 1000 MCG tablet Take 1,000 mcg by mouth daily. Every other day     No current facility-administered medications for this visit.    LABS/IMAGING: No results found for this or any previous visit (from the past 48 hour(s)).  No results found.  VITALS: BP 130/62   Pulse 72   Ht 5\' 6"  (1.676 m)   Wt 161 lb 9.6 oz (73.3 kg)   SpO2 96%   BMI 26.08 kg/m   EXAM: General appearance: alert and no distress Neck: no carotid bruit and no  JVD Lungs: clear to auscultation bilaterally Heart: regular rate and rhythm, S1, S2 normal, no murmur, click, rub or gallop Abdomen: soft, non-tender; bowel sounds normal; no masses,  no organomegaly Extremities: extremities normal, atraumatic, no cyanosis or edema Pulses: 2+ and symmetric Skin: Skin color, texture, turgor normal. No rashes or lesions Neurologic: Grossly normal Psych: Mood, affect normal  EKG: Sinus rhythm first-degree AV block at 73, bifascicular block -personally reviewed  ASSESSMENT: Shortness of breath, tachycardia Hypertension Dyslipidemia on atorvastatin Insulin-dependent diabetes Fatigue Mild coronary artery disease by cath in 2010 -apparently no new obstructive coronary disease by cath and extensive cardiac work-up in [place] (2021)-records pending  PLAN: 1.   Dustin Mccarthy perhaps has had some mild improvement in his shortness of breath with exertion but not to the degree that he would like.  Exercise seems to have been somewhat helpful.  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  This seems to have improved somewhat with physical therapy.  He should continue with this.  And will think there is a role for diuretic in him.  His heart rate seems to be well controlled.  He was recently started on some amlodipine for additional blood  pressure control and this may be helping some of his pulmonary pressures.  Blood pressure today was good at 130/62.  I would be hesitant to use beta-blocker as he has underlying trifascicular block.  We will plan a repeat echo in 6 months  Plan follow-up with me in 6 months or sooner as necessary.  Pixie Casino, MD, [facility], [place] Director of the [facility] Lipid [facility] &  Cardiovascular [facility] Diplomate of the American Board of Clinical Lipidology Attending Cardiologist  Direct Dial:  [phone]  Fax: [phone]  Website:  [email or web address] 04/28/2021, 8:55 AM

## 2021-04-28 NOTE — Patient Instructions (Signed)
Medication Instructions:  Your physician recommends that you continue on your current medications as directed. Please refer to the Current Medication list given to you today.  *If you need a refill on your cardiac medications before your next appointment, please call your pharmacy*  Testing/Procedures: Echocardiogram in 6 months This will be done at [address] [employer]rd Floor [place]   Follow-Up: At [facility], you and your health needs are our priority.  As part of our continuing mission to provide you with exceptional heart care, we have created designated Provider Care Teams.  These Care Teams include your primary Cardiologist (physician) and Advanced Practice Providers (APPs -  Physician Assistants and Nurse Practitioners) who all work together to provide you with the care you need, when you need it.  We recommend signing up for the patient portal called "MyChart".  Sign up information is provided on this After Visit Summary.  MyChart is used to connect with patients for Virtual Visits (Telemedicine).  Patients are able to view lab/test results, encounter notes, upcoming appointments, etc.  Non-urgent messages can be sent to your provider as well.   To learn more about what you can do with MyChart, go to [email or web address].    Your next appointment:   6 month(s) - after echo  The format for your next appointment:   In Person  Provider:   Lyman Bishop MD

## 2021-05-02 DIAGNOSIS — E113211 Type 2 diabetes mellitus with mild nonproliferative diabetic retinopathy with macular edema, right eye: Secondary | ICD-10-CM | POA: Diagnosis not present

## 2021-05-25 ENCOUNTER — Ambulatory Visit: Payer: Medicare Other | Admitting: Pulmonary Disease

## 2021-06-27 DIAGNOSIS — E119 Type 2 diabetes mellitus without complications: Secondary | ICD-10-CM | POA: Diagnosis not present

## 2021-06-27 DIAGNOSIS — E785 Hyperlipidemia, unspecified: Secondary | ICD-10-CM | POA: Diagnosis not present

## 2021-06-27 DIAGNOSIS — D649 Anemia, unspecified: Secondary | ICD-10-CM | POA: Diagnosis not present

## 2021-06-27 DIAGNOSIS — I10 Essential (primary) hypertension: Secondary | ICD-10-CM | POA: Diagnosis not present

## 2021-06-30 DIAGNOSIS — K219 Gastro-esophageal reflux disease without esophagitis: Secondary | ICD-10-CM | POA: Diagnosis not present

## 2021-06-30 DIAGNOSIS — I6529 Occlusion and stenosis of unspecified carotid artery: Secondary | ICD-10-CM | POA: Diagnosis not present

## 2021-06-30 DIAGNOSIS — I2510 Atherosclerotic heart disease of native coronary artery without angina pectoris: Secondary | ICD-10-CM | POA: Diagnosis not present

## 2021-06-30 DIAGNOSIS — Z0000 Encounter for general adult medical examination without abnormal findings: Secondary | ICD-10-CM | POA: Diagnosis not present

## 2021-06-30 DIAGNOSIS — D509 Iron deficiency anemia, unspecified: Secondary | ICD-10-CM | POA: Diagnosis not present

## 2021-06-30 DIAGNOSIS — E1139 Type 2 diabetes mellitus with other diabetic ophthalmic complication: Secondary | ICD-10-CM | POA: Diagnosis not present

## 2021-06-30 DIAGNOSIS — N401 Enlarged prostate with lower urinary tract symptoms: Secondary | ICD-10-CM | POA: Diagnosis not present

## 2021-06-30 DIAGNOSIS — I10 Essential (primary) hypertension: Secondary | ICD-10-CM | POA: Diagnosis not present

## 2021-07-07 ENCOUNTER — Inpatient Hospital Stay: Payer: Medicare Other | Attending: Hematology & Oncology

## 2021-07-07 ENCOUNTER — Encounter: Payer: Self-pay | Admitting: Hematology & Oncology

## 2021-07-07 ENCOUNTER — Telehealth: Payer: Self-pay | Admitting: *Deleted

## 2021-07-07 ENCOUNTER — Other Ambulatory Visit: Payer: Self-pay

## 2021-07-07 ENCOUNTER — Inpatient Hospital Stay (HOSPITAL_BASED_OUTPATIENT_CLINIC_OR_DEPARTMENT_OTHER): Payer: Medicare Other | Admitting: Hematology & Oncology

## 2021-07-07 VITALS — BP 120/73 | HR 67 | Temp 98.3°F | Resp 18 | Ht 66.0 in | Wt 162.0 lb

## 2021-07-07 DIAGNOSIS — G9332 Myalgic encephalomyelitis/chronic fatigue syndrome: Secondary | ICD-10-CM

## 2021-07-07 DIAGNOSIS — D509 Iron deficiency anemia, unspecified: Secondary | ICD-10-CM | POA: Diagnosis not present

## 2021-07-07 DIAGNOSIS — Z8616 Personal history of COVID-19: Secondary | ICD-10-CM

## 2021-07-07 DIAGNOSIS — D631 Anemia in chronic kidney disease: Secondary | ICD-10-CM

## 2021-07-07 DIAGNOSIS — N189 Chronic kidney disease, unspecified: Secondary | ICD-10-CM | POA: Insufficient documentation

## 2021-07-07 DIAGNOSIS — D500 Iron deficiency anemia secondary to blood loss (chronic): Secondary | ICD-10-CM

## 2021-07-07 DIAGNOSIS — E1122 Type 2 diabetes mellitus with diabetic chronic kidney disease: Secondary | ICD-10-CM | POA: Diagnosis not present

## 2021-07-07 DIAGNOSIS — R6889 Other general symptoms and signs: Secondary | ICD-10-CM

## 2021-07-07 DIAGNOSIS — Z8639 Personal history of other endocrine, nutritional and metabolic disease: Secondary | ICD-10-CM | POA: Insufficient documentation

## 2021-07-07 LAB — IRON AND IRON BINDING CAPACITY (CC-WL,HP ONLY)
Iron: 72 ug/dL (ref 45–182)
Saturation Ratios: 22 % (ref 17.9–39.5)
TIBC: 333 ug/dL (ref 250–450)
UIBC: 261 ug/dL (ref 117–376)

## 2021-07-07 LAB — CBC WITH DIFFERENTIAL (CANCER CENTER ONLY)
Abs Immature Granulocytes: 0.05 10*3/uL (ref 0.00–0.07)
Basophils Absolute: 0 10*3/uL (ref 0.0–0.1)
Basophils Relative: 0 %
Eosinophils Absolute: 0.1 10*3/uL (ref 0.0–0.5)
Eosinophils Relative: 1 %
HCT: 33.2 % — ABNORMAL LOW (ref 39.0–52.0)
Hemoglobin: 10.9 g/dL — ABNORMAL LOW (ref 13.0–17.0)
Immature Granulocytes: 1 %
Lymphocytes Relative: 26 %
Lymphs Abs: 2.8 10*3/uL (ref 0.7–4.0)
MCH: 31.2 pg (ref 26.0–34.0)
MCHC: 32.8 g/dL (ref 30.0–36.0)
MCV: 95.1 fL (ref 80.0–100.0)
Monocytes Absolute: 0.9 10*3/uL (ref 0.1–1.0)
Monocytes Relative: 8 %
Neutro Abs: 6.9 10*3/uL (ref 1.7–7.7)
Neutrophils Relative %: 64 %
Platelet Count: 351 10*3/uL (ref 150–400)
RBC: 3.49 MIL/uL — ABNORMAL LOW (ref 4.22–5.81)
RDW: 13.3 % (ref 11.5–15.5)
WBC Count: 10.7 10*3/uL — ABNORMAL HIGH (ref 4.0–10.5)
nRBC: 0 % (ref 0.0–0.2)

## 2021-07-07 LAB — CMP (CANCER CENTER ONLY)
ALT: 14 U/L (ref 0–44)
AST: 18 U/L (ref 15–41)
Albumin: 4.1 g/dL (ref 3.5–5.0)
Alkaline Phosphatase: 62 U/L (ref 38–126)
Anion gap: 12 (ref 5–15)
BUN: 28 mg/dL — ABNORMAL HIGH (ref 8–23)
CO2: 22 mmol/L (ref 22–32)
Calcium: 9.6 mg/dL (ref 8.9–10.3)
Chloride: 99 mmol/L (ref 98–111)
Creatinine: 1.21 mg/dL (ref 0.61–1.24)
GFR, Estimated: >60 mL/min (ref >60)
Glucose, Bld: 153 mg/dL — ABNORMAL HIGH (ref 70–99)
Potassium: 4.6 mmol/L (ref 3.5–5.1)
Sodium: 133 mmol/L — ABNORMAL LOW (ref 135–145)
Total Bilirubin: 0.4 mg/dL (ref 0.3–1.2)
Total Protein: 6.6 g/dL (ref 6.5–8.1)

## 2021-07-07 LAB — RETICULOCYTES
Immature Retic Fract: 11.3 % (ref 2.3–15.9)
RBC.: 3.48 MIL/uL — ABNORMAL LOW (ref 4.22–5.81)
Retic Count, Absolute: 38.6 10*3/uL (ref 19.0–186.0)
Retic Ct Pct: 1.1 % (ref 0.4–3.1)

## 2021-07-07 LAB — VITAMIN B12: Vitamin B-12: 402 pg/mL (ref 180–914)

## 2021-07-07 LAB — LACTATE DEHYDROGENASE: LDH: 139 U/L (ref 98–192)

## 2021-07-07 NOTE — Addendum Note (Signed)
Addended by: Volanda Napoleon on: 07/07/2021 04:13 PM   Modules accepted: Orders

## 2021-07-07 NOTE — Telephone Encounter (Signed)
-----   Message from Dustin Napoleon, Dustin Mccarthy sent at 07/07/2021  4:08 PM EST ----- Call - Dustin iron level Dustin ok!!  He needs Aranesp to try to get Dustin CBC up!!  Please set up!!!  Laurey Arrow

## 2021-07-07 NOTE — Progress Notes (Signed)
Hematology and Oncology Follow Up Visit  Dustin Mccarthy [id number] Oct 22, 1940 81 y.o. 07/07/2021   Principle Diagnosis:  Iron deficiency Anemia Erythropoietin def Anemia  Current Therapy:   IV iron - Injectafer - given on 07/11/2019     Interim History:  Dustin Mccarthy is back for a long awaited follow-up.  We last saw him 2 years ago.  After we saw him, he did get a dose of IV iron.  This did not make him feel much better.  He went out to [place] for about half a year.  Because he did not feel well out there, he was seen by multiple doctors.  He seen by hematology.  They did not feel that they could help him out.  He was seen by cardiology.  He had an extensive work-up including cardiac cath which was unremarkable.  He has been seen by pulmonary medicine.  Again, they really cannot find anything wrong with him.  He is yet to have a bone marrow biopsy.  I suppose this would be the last invasive test I could think of that could be done to see if there is anything with his bone marrow that might be causing the anemia.  He still feels tired.  His appetite is okay.  He has had no nausea or vomiting.  He has had no change in bowel or bladder habits.  I did mention that he did have upper endoscopy and colonoscopy.  Again, these were unremarkable.  I just hate that he does not feel any better.  He has had problems with COVID in the past.  I think he had COVID back in January 2022.  He really was not all that sick from what he says.  He has had no leg swelling.  There has been no rashes.  He has had no swollen lymph nodes.  He said that he also had a CT scan while out in [place].  Again this was unremarkable.   I know he had diabetes.  This concern to be an issue.  When we initially seen him, his erythropoietin level was very low.  He may need to have ESA to try to help with his anemia.  Overall, his performance status is ECOG 1.    Medications:  Current Outpatient Medications:     ACCU-CHEK AVIVA PLUS test strip, 1 strip by Other route 2 (two) times daily. Use 1 strip to check glucose twice a day, Disp: , Rfl: 0   acyclovir (ZOVIRAX) 400 MG tablet, Take 400 mg by mouth 2 (two) times daily., Disp: , Rfl:    APPLE CIDER VINEGAR PO, Take by mouth. 250 mg daily, Disp: , Rfl:    aspirin 81 MG chewable tablet, CHEW ONE TABLET BY MOUTH DAILY, Disp: , Rfl:    Cholecalciferol 50 MCG (2000 UT) TABS, Take 1 tablet by mouth daily., Disp: , Rfl:    Dulaglutide (TRULICITY [place]), Inject into the skin., Disp: , Rfl:    ferrous sulfate 325 (65 FE) MG tablet, every other day., Disp: , Rfl:    finasteride (PROSCAR) 5 MG tablet, Take 5 mg by mouth daily., Disp: , Rfl:    folic acid (FOLVITE) 1 MG tablet, 1 tablet, Disp: , Rfl:    Insulin Glargine (BASAGLAR KWIKPEN [place]), Inject 8 Units into the skin. 7 units daily, Disp: , Rfl:    metFORMIN (GLUCOPHAGE) 1000 MG tablet, Take 1,000 mg by mouth 2 (two) times daily with a meal., Disp: , Rfl:    olmesartan (BENICAR)  20 MG tablet, 1 tablet, Disp: , Rfl:    pantoprazole (PROTONIX) 40 MG tablet, Take 40 mg by mouth daily., Disp: , Rfl:    rosuvastatin (CRESTOR) 20 MG tablet, 1 tablet, Disp: , Rfl:    tamsulosin (FLOMAX) 0.4 MG CAPS capsule, Take 0.4 mg by mouth daily., Disp: , Rfl:    Thiamine HCl (VITAMIN B-1 PO), Take by mouth daily., Disp: , Rfl:    TURMERIC PO, Take by mouth. 1000 mg daily, Disp: , Rfl:    vitamin B-12 (CYANOCOBALAMIN) 1000 MCG tablet, Take 1,000 mcg by mouth daily. Every other day, Disp: , Rfl:    Zinc 50 MG TABS, Take 1 tablet by mouth daily at 6 (six) AM., Disp: , Rfl:    losartan (COZAAR) 100 MG tablet, Take 0.5 tablets by mouth daily. (Patient not taking: Reported on 07/07/2021), Disp: , Rfl:   Allergies:  Allergies  Allergen Reactions   Bee Venom Anaphylaxis   Hydrochlorothiazide Other (See Comments)    Dizzy   Oxycodone Nausea And Vomiting   Iodine    Povidone Iodine Other (See Comments)   Iodinated Contrast Media Rash     Past Medical History, Surgical history, Social history, and Family History were reviewed and updated.  Review of Systems: Review of Systems  Constitutional:  Positive for fatigue.  HENT:  Negative.    Eyes: Negative.   Respiratory: Negative.    Cardiovascular: Negative.   Gastrointestinal:  Positive for constipation.  Endocrine: Negative.   Genitourinary: Negative.    Musculoskeletal:  Positive for arthralgias and myalgias.  Skin: Negative.   Neurological:  Positive for light-headedness.  Hematological: Negative.   Psychiatric/Behavioral: Negative.     Physical Exam:  height is _0  (1.676 m) and weight is 162 lb (73.5 kg). His oral temperature is 98.3 F (36.8 C). His blood pressure is 120/73 and his pulse is 67. His respiration is 18 and oxygen saturation is 100%.   Wt Readings from Last 3 Encounters:  07/07/21 162 lb (73.5 kg)  04/28/21 161 lb 9.6 oz (73.3 kg)  02/23/21 163 lb 6.4 oz (74.1 kg)    Physical Exam Vitals reviewed.  HENT:     Head: Normocephalic and atraumatic.  Eyes:     Pupils: Pupils are equal, round, and reactive to light.  Cardiovascular:     Rate and Rhythm: Normal rate and regular rhythm.     Heart sounds: Normal heart sounds.  Pulmonary:     Effort: Pulmonary effort is normal.     Breath sounds: Normal breath sounds.  Abdominal:     General: Bowel sounds are normal.     Palpations: Abdomen is soft.  Musculoskeletal:        General: No tenderness or deformity. Normal range of motion.     Cervical back: Normal range of motion.  Lymphadenopathy:     Cervical: No cervical adenopathy.  Skin:    General: Skin is warm and dry.     Findings: No erythema or rash.  Neurological:     Mental Status: He is alert and oriented to person, place, and time.  Psychiatric:        Behavior: Behavior normal.        Thought Content: Thought content normal.        Judgment: Judgment normal.     Lab Results  Component Value Date   WBC 10.7 (H)  07/07/2021   HGB 10.9 (L) 07/07/2021   HCT 33.2 (L) 07/07/2021   MCV 95.1 07/07/2021  PLT 351 07/07/2021     Chemistry      Component Value Date/Time   NA 133 (L) 07/07/2021 1035   NA 134 (L) 03/31/2014 1432   K 4.6 07/07/2021 1035   K 4.6 03/31/2014 1432   CL 99 07/07/2021 1035   CO2 22 07/07/2021 1035   CO2 23 03/31/2014 1432   BUN 28 (H) 07/07/2021 1035   BUN 17.2 03/31/2014 1432   CREATININE 1.21 07/07/2021 1035   CREATININE 1.0 03/31/2014 1432      Component Value Date/Time   CALCIUM 9.6 07/07/2021 1035   CALCIUM 9.5 03/31/2014 1432   ALKPHOS 62 07/07/2021 1035   ALKPHOS 63 02/16/2014 1140   AST 18 07/07/2021 1035   AST 15 02/16/2014 1140   ALT 14 07/07/2021 1035   ALT 12 02/16/2014 1140   BILITOT 0.4 07/07/2021 1035   BILITOT 0.38 02/16/2014 1140      Impression and Plan: Dustin Mccarthy is a 81 year old white male.  He has diabetes.  He has mild anemia.  I looked at his blood smear.  I really do not see anything that looked suspicious on the blood smear.  He had no nucleated red blood cells.  There were no target cells.  There is no rouleaux formation.  His white blood cells appear mature.  There were no hypersegmented polys.  I we will see what his iron studies look like.  I suspect that we probably will have to consider him for ESA.  Maybe this will get his anemia better.  Again, he is not had a bone marrow biopsy.  This would be the last test that I could think of that could be done to try to help sort out what might be going on.  I suppose that he may not even have a blood problem.  Again this would be hard to sort out without a bone marrow biopsy.  I spent a good 45 minutes with he and his wife.  Again they are very nice.  It was nice to see him again.  I would just like to get his quality of life better so he will enjoy himself and enjoyed the year.  Once we get the iron studies back, then we will see if he needs iron.  Again I am sure he will need  ESA.   Volanda Napoleon, MD 2/9/20232:19 PM

## 2021-07-07 NOTE — Telephone Encounter (Signed)
Per Dr. Marin Olp, I informed the patient that his iron level is OK. The RBC is a little low and Dr. Marin Olp would like for you to get Aranesp. He verbalized understanding. LOS sent to scheduling.

## 2021-07-08 LAB — FERRITIN: Ferritin: 103 ng/mL (ref 24–336)

## 2021-07-08 LAB — TSH: TSH: 2.594 u[IU]/mL (ref 0.320–4.118)

## 2021-07-11 DIAGNOSIS — E113211 Type 2 diabetes mellitus with mild nonproliferative diabetic retinopathy with macular edema, right eye: Secondary | ICD-10-CM | POA: Diagnosis not present

## 2021-07-12 LAB — ERYTHROPOIETIN: Erythropoietin: 7.5 m[IU]/mL (ref 2.6–18.5)

## 2021-07-13 ENCOUNTER — Encounter: Payer: Medicare Other | Admitting: Hematology & Oncology

## 2021-07-13 ENCOUNTER — Other Ambulatory Visit: Payer: Medicare Other

## 2021-07-13 ENCOUNTER — Inpatient Hospital Stay: Payer: Medicare Other

## 2021-07-13 ENCOUNTER — Other Ambulatory Visit: Payer: Self-pay

## 2021-07-13 VITALS — BP 145/65 | HR 67 | Temp 97.3°F | Resp 18

## 2021-07-13 DIAGNOSIS — D500 Iron deficiency anemia secondary to blood loss (chronic): Secondary | ICD-10-CM

## 2021-07-13 DIAGNOSIS — E1122 Type 2 diabetes mellitus with diabetic chronic kidney disease: Secondary | ICD-10-CM | POA: Diagnosis not present

## 2021-07-13 DIAGNOSIS — D631 Anemia in chronic kidney disease: Secondary | ICD-10-CM | POA: Diagnosis not present

## 2021-07-13 DIAGNOSIS — Z8639 Personal history of other endocrine, nutritional and metabolic disease: Secondary | ICD-10-CM | POA: Diagnosis not present

## 2021-07-13 DIAGNOSIS — Z8616 Personal history of COVID-19: Secondary | ICD-10-CM | POA: Diagnosis not present

## 2021-07-13 DIAGNOSIS — N189 Chronic kidney disease, unspecified: Secondary | ICD-10-CM | POA: Diagnosis not present

## 2021-07-13 MED ORDER — DARBEPOETIN ALFA 300 MCG/0.6ML IJ SOSY
300.0000 ug | PREFILLED_SYRINGE | Freq: Once | INTRAMUSCULAR | Status: AC
  Administered 2021-07-13: 300 ug via SUBCUTANEOUS
  Filled 2021-07-13: qty 0.6

## 2021-07-13 NOTE — Patient Instructions (Signed)
Darbepoetin Alfa injection ?What is this medication? ?DARBEPOETIN ALFA (dar be POE e tin  AL fa) helps your body make more red blood cells. It is used to treat anemia caused by chronic kidney failure and chemotherapy. ?This medicine may be used for other purposes; ask your health care provider or pharmacist if you have questions. ?COMMON BRAND NAME(S): Aranesp ?What should I tell my care team before I take this medication? ?They need to know if you have any of these conditions: ?blood clotting disorders or history of blood clots ?cancer patient not on chemotherapy ?cystic fibrosis ?heart disease, such as angina, heart failure, or a history of a heart attack ?hemoglobin level of 12 g/dL or greater ?high blood pressure ?low levels of folate, iron, or vitamin B12 ?seizures ?an unusual or allergic reaction to darbepoetin, erythropoietin, albumin, hamster proteins, latex, other medicines, foods, dyes, or preservatives ?pregnant or trying to get pregnant ?breast-feeding ?How should I use this medication? ?This medicine is for injection into a vein or under the skin. It is usually given by a health care professional in a hospital or clinic setting. ?If you get this medicine at home, you will be taught how to prepare and give this medicine. Use exactly as directed. Take your medicine at regular intervals. Do not take your medicine more often than directed. ?It is important that you put your used needles and syringes in a special sharps container. Do not put them in a trash can. If you do not have a sharps container, call your pharmacist or healthcare provider to get one. ?A special MedGuide will be given to you by the pharmacist with each prescription and refill. Be sure to read this information carefully each time. ?Talk to your pediatrician regarding the use of this medicine in children. While this medicine may be used in children as young as 1 month of age for selected conditions, precautions do apply. ?Overdosage: If  you think you have taken too much of this medicine contact a poison control center or emergency room at once. ?NOTE: This medicine is only for you. Do not share this medicine with others. ?What if I miss a dose? ?If you miss a dose, take it as soon as you can. If it is almost time for your next dose, take only that dose. Do not take double or extra doses. ?What may interact with this medication? ?Do not take this medicine with any of the following medications: ?epoetin alfa ?This list may not describe all possible interactions. Give your health care provider a list of all the medicines, herbs, non-prescription drugs, or dietary supplements you use. Also tell them if you smoke, drink alcohol, or use illegal drugs. Some items may interact with your medicine. ?What should I watch for while using this medication? ?Your condition will be monitored carefully while you are receiving this medicine. ?You may need blood work done while you are taking this medicine. ?This medicine may cause a decrease in vitamin B6. You should make sure that you get enough vitamin B6 while you are taking this medicine. Discuss the foods you eat and the vitamins you take with your health care professional. ?What side effects may I notice from receiving this medication? ?Side effects that you should report to your doctor or health care professional as soon as possible: ?allergic reactions like skin rash, itching or hives, swelling of the face, lips, or tongue ?breathing problems ?changes in vision ?chest pain ?confusion, trouble speaking or understanding ?feeling faint or lightheaded, falls ?high blood   pressure ?muscle aches or pains ?pain, swelling, warmth in the leg ?rapid weight gain ?severe headaches ?sudden numbness or weakness of the face, arm or leg ?trouble walking, dizziness, loss of balance or coordination ?seizures (convulsions) ?swelling of the ankles, feet, hands ?unusually weak or tired ?Side effects that usually do not require  medical attention (report to your doctor or health care professional if they continue or are bothersome): ?diarrhea ?fever, chills (flu-like symptoms) ?headaches ?nausea, vomiting ?redness, stinging, or swelling at site where injected ?This list may not describe all possible side effects. Call your doctor for medical advice about side effects. You may report side effects to FDA at 1-800-FDA-1088. ?Where should I keep my medication? ?Keep out of the reach of children. ?Store in a refrigerator between 2 and 8 degrees C (36 and 46 degrees F). Do not freeze. Do not shake. Throw away any unused portion if using a single-dose vial. Throw away any unused medicine after the expiration date. ?NOTE: This sheet is a summary. It may not cover all possible information. If you have questions about this medicine, talk to your doctor, pharmacist, or health care provider. ?? 2022 Elsevier/Gold Standard (2017-06-04 00:00:00) ? ?

## 2021-08-03 ENCOUNTER — Encounter: Payer: Self-pay | Admitting: Hematology & Oncology

## 2021-08-03 ENCOUNTER — Inpatient Hospital Stay: Payer: Medicare Other | Attending: Hematology & Oncology

## 2021-08-03 ENCOUNTER — Inpatient Hospital Stay: Payer: Medicare Other

## 2021-08-03 ENCOUNTER — Inpatient Hospital Stay (HOSPITAL_BASED_OUTPATIENT_CLINIC_OR_DEPARTMENT_OTHER): Payer: Medicare Other | Admitting: Hematology & Oncology

## 2021-08-03 ENCOUNTER — Other Ambulatory Visit: Payer: Self-pay

## 2021-08-03 VITALS — BP 129/63 | HR 65 | Temp 98.3°F | Resp 18 | Ht 66.0 in | Wt 163.1 lb

## 2021-08-03 DIAGNOSIS — E119 Type 2 diabetes mellitus without complications: Secondary | ICD-10-CM | POA: Diagnosis not present

## 2021-08-03 DIAGNOSIS — D631 Anemia in chronic kidney disease: Secondary | ICD-10-CM | POA: Insufficient documentation

## 2021-08-03 DIAGNOSIS — D509 Iron deficiency anemia, unspecified: Secondary | ICD-10-CM | POA: Insufficient documentation

## 2021-08-03 DIAGNOSIS — N189 Chronic kidney disease, unspecified: Secondary | ICD-10-CM | POA: Insufficient documentation

## 2021-08-03 DIAGNOSIS — D500 Iron deficiency anemia secondary to blood loss (chronic): Secondary | ICD-10-CM

## 2021-08-03 LAB — CMP (CANCER CENTER ONLY)
ALT: 14 U/L (ref 0–44)
AST: 18 U/L (ref 15–41)
Albumin: 4 g/dL (ref 3.5–5.0)
Alkaline Phosphatase: 51 U/L (ref 38–126)
Anion gap: 10 (ref 5–15)
BUN: 21 mg/dL (ref 8–23)
CO2: 24 mmol/L (ref 22–32)
Calcium: 9.3 mg/dL (ref 8.9–10.3)
Chloride: 98 mmol/L (ref 98–111)
Creatinine: 1.19 mg/dL (ref 0.61–1.24)
GFR, Estimated: >60 mL/min (ref >60)
Glucose, Bld: 213 mg/dL — ABNORMAL HIGH (ref 70–99)
Potassium: 5.1 mmol/L (ref 3.5–5.1)
Sodium: 132 mmol/L — ABNORMAL LOW (ref 135–145)
Total Bilirubin: 0.4 mg/dL (ref 0.3–1.2)
Total Protein: 6.5 g/dL (ref 6.5–8.1)

## 2021-08-03 LAB — CBC WITH DIFFERENTIAL (CANCER CENTER ONLY)
Abs Immature Granulocytes: 0.04 10*3/uL (ref 0.00–0.07)
Basophils Absolute: 0.1 10*3/uL (ref 0.0–0.1)
Basophils Relative: 1 %
Eosinophils Absolute: 0 10*3/uL (ref 0.0–0.5)
Eosinophils Relative: 0 %
HCT: 39.2 % (ref 39.0–52.0)
Hemoglobin: 12.6 g/dL — ABNORMAL LOW (ref 13.0–17.0)
Immature Granulocytes: 0 %
Lymphocytes Relative: 21 %
Lymphs Abs: 2.3 10*3/uL (ref 0.7–4.0)
MCH: 31.3 pg (ref 26.0–34.0)
MCHC: 32.1 g/dL (ref 30.0–36.0)
MCV: 97.3 fL (ref 80.0–100.0)
Monocytes Absolute: 0.8 10*3/uL (ref 0.1–1.0)
Monocytes Relative: 8 %
Neutro Abs: 7.4 10*3/uL (ref 1.7–7.7)
Neutrophils Relative %: 70 %
Platelet Count: 342 10*3/uL (ref 150–400)
RBC: 4.03 MIL/uL — ABNORMAL LOW (ref 4.22–5.81)
RDW: 14.2 % (ref 11.5–15.5)
WBC Count: 10.6 10*3/uL — ABNORMAL HIGH (ref 4.0–10.5)
nRBC: 0 % (ref 0.0–0.2)

## 2021-08-03 NOTE — Progress Notes (Signed)
?Hematology and Oncology Follow Up Visit ? ?Dustin Mccarthy ?[id number] ?Sep 12, 1940 81 y.o. ?08/03/2021 ? ? 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 ?Current Therapy:   ?IV iron - Injectafer - given on 07/11/2019 ?Aranesp 300 mcg sq for Hgb <11 -- dose given 07/13/2021 ?    ?Interim History:  Dustin Mccarthy is back for a follow-up.  We did go ahead and start him on Aranesp.  This is worked incredibly well.  His hemoglobin went up to 2 points.  It is now 12.6.  He feels a little bit better although he still has a lot of fatigue.  I think some of this is from his diabetes. ? ?When we last saw him in February, his ferritin was 103 with an iron saturation of 22%.  He is taking some oral iron at home.  We will see what his iron studies look like at this time.  Hopefully, they will be holding their own. ? ?His last vitamin B12 level back in February was 402. ? ?He has had no bleeding.  He has had no change in bowel or bladder habits.  He has had no cough or shortness of breath.  Again he does have some shortness of breath with exertion. ? ?I think he is planning on going out to [place] I think in the springtime to see a grandson graduate high school. ? ?Overall, I would say his performance status right now is ECOG 1. ? ?   ? ?Medications:  ?Current Outpatient Medications:  ?  ACCU-CHEK AVIVA PLUS test strip, 1 strip by Other route 2 (two) times daily. Use 1 strip to check glucose twice a day, Disp: , Rfl: 0 ?  acyclovir (ZOVIRAX) 400 MG tablet, Take 400 mg by mouth 2 (two) times daily., Disp: , Rfl:  ?  amLODipine (NORVASC) 2.5 MG tablet, Take 2.5 mg by mouth daily., Disp: , Rfl:  ?  APPLE CIDER VINEGAR PO, Take by mouth. 250 mg daily, Disp: , Rfl:  ?  aspirin 81 MG chewable tablet, CHEW ONE TABLET BY MOUTH DAILY, Disp: , Rfl:  ?  Cholecalciferol 50 MCG (2000 UT) TABS, Take 1 tablet by mouth daily., Disp: , Rfl:  ?  Dulaglutide (TRULICITY [place]), Inject into the skin., Disp: , Rfl:  ?  ferrous  sulfate 325 (65 FE) MG tablet, every other day., Disp: , Rfl:  ?  finasteride (PROSCAR) 5 MG tablet, Take 5 mg by mouth daily., Disp: , Rfl:  ?  folic acid (FOLVITE) 1 MG tablet, 1 tablet, Disp: , Rfl:  ?  Insulin Glargine (BASAGLAR KWIKPEN [place]), Inject 8 Units into the skin. 7 units daily, Disp: , Rfl:  ?  metFORMIN (GLUCOPHAGE) 1000 MG tablet, Take 1,000 mg by mouth 2 (two) times daily with a meal., Disp: , Rfl:  ?  olmesartan (BENICAR) 20 MG tablet, 1 tablet, Disp: , Rfl:  ?  pantoprazole (PROTONIX) 40 MG tablet, Take 40 mg by mouth daily., Disp: , Rfl:  ?  rosuvastatin (CRESTOR) 20 MG tablet, 1 tablet, Disp: , Rfl:  ?  tamsulosin (FLOMAX) 0.4 MG CAPS capsule, Take 0.4 mg by mouth daily., Disp: , Rfl:  ?  Thiamine HCl (VITAMIN B-1 PO), Take by mouth daily., Disp: , Rfl:  ?  TURMERIC PO, Take by mouth. 1000 mg daily, Disp: , Rfl:  ?  vitamin B-12 (CYANOCOBALAMIN) 1000 MCG tablet, Take 1,000 mcg by mouth daily. Every other day, Disp: , Rfl:  ?  Zinc 50 MG TABS, Take  1 tablet by mouth daily at 6 (six) AM., Disp: , Rfl:  ? ?Allergies:  ?Allergies  ?Allergen Reactions  ? Bee Venom Anaphylaxis  ? Hydrochlorothiazide Other (See Comments)  ?  Dizzy  ? Oxycodone Nausea And Vomiting  ? Iodinated Contrast Media Rash  ? Iodine Other (See Comments)  ?  UNKNOWN REACTION  ? Povidone Iodine Other (See Comments)  ?  UNKNOWN REACTION  ? ? ?Past Medical History, Surgical history, Social history, and Family History were reviewed and updated. ? ?Review of Systems: ?Review of Systems  ?Constitutional:  Positive for fatigue.  ?HENT:  Negative.    ?Eyes: Negative.   ?Respiratory: Negative.    ?Cardiovascular: Negative.   ?Gastrointestinal:  Positive for constipation.  ?Endocrine: Negative.   ?Genitourinary: Negative.    ?Musculoskeletal:  Positive for arthralgias and myalgias.  ?Skin: Negative.   ?Neurological:  Positive for light-headedness.  ?Hematological: Negative.   ?Psychiatric/Behavioral: Negative.    ? ?Physical Exam: ? height is  $R'5\' 6"'EW$  (1.676 m) and weight is 163 lb 1.9 oz (74 kg). His oral temperature is 98.3 ?F (36.8 ?C). His blood pressure is 129/63 and his pulse is 65. His respiration is 18 and oxygen saturation is 100%.  ? ?Wt Readings from Last 3 Encounters:  ?08/03/21 163 lb 1.9 oz (74 kg)  ?07/07/21 162 lb (73.5 kg)  ?04/28/21 161 lb 9.6 oz (73.3 kg)  ? ? ?Physical Exam ?Vitals reviewed.  ?HENT:  ?   Head: Normocephalic and atraumatic.  ?Eyes:  ?   Pupils: Pupils are equal, round, and reactive to light.  ?Cardiovascular:  ?   Rate and Rhythm: Normal rate and regular rhythm.  ?   Heart sounds: Normal heart sounds.  ?Pulmonary:  ?   Effort: Pulmonary effort is normal.  ?   Breath sounds: Normal breath sounds.  ?Abdominal:  ?   General: Bowel sounds are normal.  ?   Palpations: Abdomen is soft.  ?Musculoskeletal:     ?   General: No tenderness or deformity. Normal range of motion.  ?   Cervical back: Normal range of motion.  ?Lymphadenopathy:  ?   Cervical: No cervical adenopathy.  ?Skin: ?   General: Skin is warm and dry.  ?   Findings: No erythema or rash.  ?Neurological:  ?   Mental Status: He is alert and oriented to person, place, and time.  ?Psychiatric:     ?   Behavior: Behavior normal.     ?   Thought Content: Thought content normal.     ?   Judgment: Judgment normal.  ? ? ? ?Lab Results  ?Component Value Date  ? WBC 10.6 (H) 08/03/2021  ? HGB 12.6 (L) 08/03/2021  ? HCT 39.2 08/03/2021  ? MCV 97.3 08/03/2021  ? PLT 342 08/03/2021  ? ?  Chemistry   ?   ?Component Value Date/Time  ? NA 132 (L) 08/03/2021 1159  ? NA 134 (L) 03/31/2014 1432  ? K 5.1 08/03/2021 1159  ? K 4.6 03/31/2014 1432  ? CL 98 08/03/2021 1159  ? CO2 24 08/03/2021 1159  ? CO2 23 03/31/2014 1432  ? BUN 21 08/03/2021 1159  ? BUN 17.2 03/31/2014 1432  ? CREATININE 1.19 08/03/2021 1159  ? CREATININE 1.0 03/31/2014 1432  ?    ?Component Value Date/Time  ? CALCIUM 9.3 08/03/2021 1159  ? CALCIUM 9.5 03/31/2014 1432  ? ALKPHOS 51 08/03/2021 1159  ? ALKPHOS 63  02/16/2014 1140  ? AST 18 08/03/2021 1159  ?  AST 15 02/16/2014 1140  ? ALT 14 08/03/2021 1159  ? ALT 12 02/16/2014 1140  ? BILITOT 0.4 08/03/2021 1159  ? BILITOT 0.38 02/16/2014 1140  ?  ? ? ?Impression and Plan: ?Mr. Lafoy is a 81 year old white male.  He has diabetes.  He has mild anemia.  He responded incredibly well to the Aranesp.  We will see what his iron studies look like.  I would think that they would be okay given that the MCV is 97. ? 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  Again I do not think we have to do a bone marrow biopsy on him since he responded so well to the Aranesp. ? ?I looked at his blood smear.  I really do not see anything that looked suspicious on the blood smear.  He had no nucleated red blood cells.  There were no target cells.  There is no rouleaux formation.  His white blood cells appear mature.  There were no hypersegmented polys. ? ?At this point, I would like to get him back before he goes out to [place].  We will get him back in late April.  I think this would be reasonable.  I want to make sure that his blood is as good as possible for goes out to [place] for such an important event. ? ? ?Volanda Napoleon, MD ?3/8/20231:05 PM ?

## 2021-08-04 LAB — IRON AND IRON BINDING CAPACITY (CC-WL,HP ONLY)
Iron: 63 ug/dL (ref 45–182)
Saturation Ratios: 17 % — ABNORMAL LOW (ref 17.9–39.5)
TIBC: 371 ug/dL (ref 250–450)
UIBC: 308 ug/dL (ref 117–376)

## 2021-08-04 LAB — FERRITIN: Ferritin: 33 ng/mL (ref 24–336)

## 2021-08-05 ENCOUNTER — Telehealth: Payer: Self-pay

## 2021-08-05 ENCOUNTER — Telehealth: Payer: Self-pay | Admitting: Hematology & Oncology

## 2021-08-05 NOTE — Telephone Encounter (Signed)
-----   Message from Volanda Napoleon, MD sent at 08/04/2021  5:53 PM EST ----- ?Call and let him know that the iron is low.  He does need some IV iron.  Please set this up.  Thanks.  Pete ?

## 2021-08-05 NOTE — Telephone Encounter (Signed)
Called to schedule 1 dose of iron per 3/10 sch msg , spoke to patient and he would like to call us back to schedule. Patient's wife is in the hospital and would like to call us back at a better time to schedule ?

## 2021-08-05 NOTE — Telephone Encounter (Signed)
Advised pt via MyChart.

## 2021-08-16 ENCOUNTER — Telehealth: Payer: Self-pay | Admitting: *Deleted

## 2021-08-16 NOTE — Telephone Encounter (Signed)
Called to schedule 1 dose of iron per 3/10 sch ms - lvm for a call back to schedule. ?

## 2021-08-18 ENCOUNTER — Telehealth: Payer: Self-pay | Admitting: *Deleted

## 2021-08-18 DIAGNOSIS — D509 Iron deficiency anemia, unspecified: Secondary | ICD-10-CM | POA: Diagnosis not present

## 2021-08-18 DIAGNOSIS — E11319 Type 2 diabetes mellitus with unspecified diabetic retinopathy without macular edema: Secondary | ICD-10-CM | POA: Diagnosis not present

## 2021-08-18 DIAGNOSIS — E785 Hyperlipidemia, unspecified: Secondary | ICD-10-CM | POA: Diagnosis not present

## 2021-08-18 DIAGNOSIS — I10 Essential (primary) hypertension: Secondary | ICD-10-CM | POA: Diagnosis not present

## 2021-08-18 DIAGNOSIS — Z794 Long term (current) use of insulin: Secondary | ICD-10-CM | POA: Diagnosis not present

## 2021-08-18 NOTE — Telephone Encounter (Signed)
Per schedule message - patient said that he would call to schedule (1) dose of iron just as soon as his wife gets out of the hospital. ?

## 2021-08-19 ENCOUNTER — Telehealth: Payer: Self-pay | Admitting: *Deleted

## 2021-08-19 NOTE — Telephone Encounter (Signed)
Call received from patient stating that he was told that there is another option for him besides having to get IV iron. Pt states that he is currently taking Ferrous Sulfate 325 mg every other day. Dr. Marin Olp notified. Instructed pt per order of Dr. Marin Olp that it is ok for him to hold off on IV iron at this time and to continue Ferrous Sulfate 325 mg every other day.  Pt is appreciative of assistance and has no other questions at this time. ? ? ?

## 2021-08-24 ENCOUNTER — Telehealth: Payer: Self-pay | Admitting: Hematology & Oncology

## 2021-08-24 NOTE — Telephone Encounter (Signed)
Pt called back and states he has talked to nurse and made Dr. Marin Olp aware he is unable to schedule iron infusion at this time and will come to follow up appointment  ?

## 2021-08-24 NOTE — Telephone Encounter (Signed)
Called to schedule dose of iron per 3/10 sch msg, patient has not called [place] back . Left voicemail  ?

## 2021-09-01 DIAGNOSIS — I10 Essential (primary) hypertension: Secondary | ICD-10-CM | POA: Diagnosis not present

## 2021-09-22 ENCOUNTER — Inpatient Hospital Stay: Payer: Medicare Other

## 2021-09-22 ENCOUNTER — Encounter: Payer: Self-pay | Admitting: Hematology & Oncology

## 2021-09-22 ENCOUNTER — Other Ambulatory Visit: Payer: Self-pay

## 2021-09-22 ENCOUNTER — Inpatient Hospital Stay (HOSPITAL_BASED_OUTPATIENT_CLINIC_OR_DEPARTMENT_OTHER): Payer: Medicare Other | Admitting: Hematology & Oncology

## 2021-09-22 ENCOUNTER — Inpatient Hospital Stay: Payer: Medicare Other | Attending: Hematology & Oncology

## 2021-09-22 VITALS — BP 141/66 | HR 64 | Temp 98.0°F | Resp 18 | Ht 66.0 in | Wt 160.0 lb

## 2021-09-22 DIAGNOSIS — D500 Iron deficiency anemia secondary to blood loss (chronic): Secondary | ICD-10-CM | POA: Diagnosis not present

## 2021-09-22 DIAGNOSIS — N189 Chronic kidney disease, unspecified: Secondary | ICD-10-CM | POA: Diagnosis not present

## 2021-09-22 DIAGNOSIS — D509 Iron deficiency anemia, unspecified: Secondary | ICD-10-CM | POA: Diagnosis not present

## 2021-09-22 DIAGNOSIS — E1122 Type 2 diabetes mellitus with diabetic chronic kidney disease: Secondary | ICD-10-CM | POA: Diagnosis not present

## 2021-09-22 DIAGNOSIS — D631 Anemia in chronic kidney disease: Secondary | ICD-10-CM | POA: Diagnosis not present

## 2021-09-22 LAB — CBC WITH DIFFERENTIAL (CANCER CENTER ONLY)
Abs Immature Granulocytes: 0.05 10*3/uL (ref 0.00–0.07)
Basophils Absolute: 0.1 10*3/uL (ref 0.0–0.1)
Basophils Relative: 0 %
Eosinophils Absolute: 0 10*3/uL (ref 0.0–0.5)
Eosinophils Relative: 0 %
HCT: 36.1 % — ABNORMAL LOW (ref 39.0–52.0)
Hemoglobin: 11.8 g/dL — ABNORMAL LOW (ref 13.0–17.0)
Immature Granulocytes: 0 %
Lymphocytes Relative: 23 %
Lymphs Abs: 2.7 10*3/uL (ref 0.7–4.0)
MCH: 30.6 pg (ref 26.0–34.0)
MCHC: 32.7 g/dL (ref 30.0–36.0)
MCV: 93.5 fL (ref 80.0–100.0)
Monocytes Absolute: 0.7 10*3/uL (ref 0.1–1.0)
Monocytes Relative: 6 %
Neutro Abs: 8.4 10*3/uL — ABNORMAL HIGH (ref 1.7–7.7)
Neutrophils Relative %: 71 %
Platelet Count: 425 10*3/uL — ABNORMAL HIGH (ref 150–400)
RBC: 3.86 MIL/uL — ABNORMAL LOW (ref 4.22–5.81)
RDW: 14.4 % (ref 11.5–15.5)
WBC Count: 11.9 10*3/uL — ABNORMAL HIGH (ref 4.0–10.5)
nRBC: 0 % (ref 0.0–0.2)

## 2021-09-22 LAB — CMP (CANCER CENTER ONLY)
ALT: 14 U/L (ref 0–44)
AST: 18 U/L (ref 15–41)
Albumin: 3.9 g/dL (ref 3.5–5.0)
Alkaline Phosphatase: 58 U/L (ref 38–126)
Anion gap: 10 (ref 5–15)
BUN: 20 mg/dL (ref 8–23)
CO2: 25 mmol/L (ref 22–32)
Calcium: 9.4 mg/dL (ref 8.9–10.3)
Chloride: 100 mmol/L (ref 98–111)
Creatinine: 1.11 mg/dL (ref 0.61–1.24)
GFR, Estimated: >60 mL/min (ref >60)
Glucose, Bld: 171 mg/dL — ABNORMAL HIGH (ref 70–99)
Potassium: 4.5 mmol/L (ref 3.5–5.1)
Sodium: 135 mmol/L (ref 135–145)
Total Bilirubin: 0.3 mg/dL (ref 0.3–1.2)
Total Protein: 6.8 g/dL (ref 6.5–8.1)

## 2021-09-22 LAB — RETICULOCYTES
Immature Retic Fract: 9.6 % (ref 2.3–15.9)
RBC.: 3.81 MIL/uL — ABNORMAL LOW (ref 4.22–5.81)
Retic Count, Absolute: 38.1 10*3/uL (ref 19.0–186.0)
Retic Ct Pct: 1 % (ref 0.4–3.1)

## 2021-09-22 LAB — SAVE SMEAR(SSMR), FOR PROVIDER SLIDE REVIEW

## 2021-09-22 LAB — FERRITIN: Ferritin: 57 ng/mL (ref 24–336)

## 2021-09-22 NOTE — Progress Notes (Signed)
?Hematology and Oncology Follow Up Visit ? ?Dustin Mccarthy ?[id number] ?09-23-1940 81 y.o. ?09/22/2021 ? ? 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 ?Current Therapy:   ?IV iron - Injectafer - given on 07/11/2019 ?Aranesp 300 mcg sq for Hgb <11 -- dose given 07/13/2021 ?    ?Interim History:  Dustin Mccarthy is back for a follow-up.  Unfortunately, his poor wife has been in the hospital since March 10.  She apparently has myasthenia gravis.  She is in the [facility] that is within [facility]. ? ?I am just horrified that she is been there so long.  She has a lot going on.  She I guess has been on a ventilator. ? ?Hopefully, she will be able to get out of there and probably go to rehab. ? ?I know this is because a lot of stress for him.  He has not been able to eat as well.  He does have diabetes so his blood sugars are on the high side. ? ?He has had no obvious bleeding.  There is been no change in bowel or bladder habits.  He has had no issues with nausea or vomiting.  He has had no leg swelling.  He has had no rashes. ? ?His last iron studies back in March showed a ferritin of 33 with an iron saturation of 17%. ? ?He wants to try oral iron.  I told him we could try him on some Fusion Plus.  I told him to go to the pharmacy and see if they will give him some.  If not, we can write a prescription for him. ? ?Currently, I would say his performance status is probably ECOG 1. ?   ? ?Medications:  ?Current Outpatient Medications:  ?  ACCU-CHEK AVIVA PLUS test strip, 1 strip by Other route 2 (two) times daily. Use 1 strip to check glucose twice a day, Disp: , Rfl: 0 ?  acyclovir (ZOVIRAX) 400 MG tablet, Take 400 mg by mouth 2 (two) times daily., Disp: , Rfl:  ?  APPLE CIDER VINEGAR PO, Take by mouth. 250 mg daily, Disp: , Rfl:  ?  aspirin 81 MG chewable tablet, CHEW ONE TABLET BY MOUTH DAILY, Disp: , Rfl:  ?  Cholecalciferol 50 MCG (2000 UT) TABS, Take 1 tablet by mouth daily.,  Disp: , Rfl:  ?  Dulaglutide (TRULICITY [place]), Inject into the skin., Disp: , Rfl:  ?  ferrous sulfate 325 (65 FE) MG tablet, every other day., Disp: , Rfl:  ?  finasteride (PROSCAR) 5 MG tablet, Take 5 mg by mouth daily., Disp: , Rfl:  ?  folic acid (FOLVITE) 1 MG tablet, 1 tablet, Disp: , Rfl:  ?  Insulin Glargine (BASAGLAR KWIKPEN [place]), Inject 8 Units into the skin. 7 units daily, Disp: , Rfl:  ?  metFORMIN (GLUCOPHAGE) 1000 MG tablet, Take 1,000 mg by mouth 2 (two) times daily with a meal., Disp: , Rfl:  ?  olmesartan (BENICAR) 40 MG tablet, Take 40 mg by mouth daily., Disp: , Rfl:  ?  pantoprazole (PROTONIX) 40 MG tablet, Take 40 mg by mouth daily., Disp: , Rfl:  ?  tamsulosin (FLOMAX) 0.4 MG CAPS capsule, Take 0.4 mg by mouth daily., Disp: , Rfl:  ?  Thiamine HCl (VITAMIN B-1 PO), Take by mouth daily., Disp: , Rfl:  ?  TURMERIC PO, Take by mouth. 1000 mg daily, Disp: , Rfl:  ?  vitamin B-12 (CYANOCOBALAMIN) 1000 MCG tablet, Take 1,000 mcg by  mouth daily. Every other day, Disp: , Rfl:  ?  Zinc 50 MG TABS, Take 1 tablet by mouth daily at 6 (six) AM., Disp: , Rfl:  ? ?Allergies:  ?Allergies  ?Allergen Reactions  ? Bee Venom Anaphylaxis  ? Hydrochlorothiazide Other (See Comments)  ?  Dizzy  ? Oxycodone Nausea And Vomiting  ? Iodinated Contrast Media Rash  ? Iodine Other (See Comments)  ?  UNKNOWN REACTION  ? Povidone Iodine Other (See Comments)  ?  UNKNOWN REACTION  ? ? ?Past Medical History, Surgical history, Social history, and Family History were reviewed and updated. ? ?Review of Systems: ?Review of Systems  ?Constitutional:  Positive for fatigue.  ?HENT:  Negative.    ?Eyes: Negative.   ?Respiratory: Negative.    ?Cardiovascular: Negative.   ?Gastrointestinal:  Positive for constipation.  ?Endocrine: Negative.   ?Genitourinary: Negative.    ?Musculoskeletal:  Positive for arthralgias and myalgias.  ?Skin: Negative.   ?Neurological:  Positive for light-headedness.  ?Hematological: Negative.    ?Psychiatric/Behavioral: Negative.    ? ?Physical Exam: ? height is '5\' 6"'  (1.676 m) and weight is 160 lb (72.6 kg). His oral temperature is 98 ?F (36.7 ?C). His blood pressure is 141/66 (abnormal) and his pulse is 64. His respiration is 18 and oxygen saturation is 99%.  ? ?Wt Readings from Last 3 Encounters:  ?09/22/21 160 lb (72.6 kg)  ?08/03/21 163 lb 1.9 oz (74 kg)  ?07/07/21 162 lb (73.5 kg)  ? ? ?Physical Exam ?Vitals reviewed.  ?HENT:  ?   Head: Normocephalic and atraumatic.  ?Eyes:  ?   Pupils: Pupils are equal, round, and reactive to light.  ?Cardiovascular:  ?   Rate and Rhythm: Normal rate and regular rhythm.  ?   Heart sounds: Normal heart sounds.  ?Pulmonary:  ?   Effort: Pulmonary effort is normal.  ?   Breath sounds: Normal breath sounds.  ?Abdominal:  ?   General: Bowel sounds are normal.  ?   Palpations: Abdomen is soft.  ?Musculoskeletal:     ?   General: No tenderness or deformity. Normal range of motion.  ?   Cervical back: Normal range of motion.  ?Lymphadenopathy:  ?   Cervical: No cervical adenopathy.  ?Skin: ?   General: Skin is warm and dry.  ?   Findings: No erythema or rash.  ?Neurological:  ?   Mental Status: He is alert and oriented to person, place, and time.  ?Psychiatric:     ?   Behavior: Behavior normal.     ?   Thought Content: Thought content normal.     ?   Judgment: Judgment normal.  ? ? ? ?Lab Results  ?Component Value Date  ? WBC 11.9 (H) 09/22/2021  ? HGB 11.8 (L) 09/22/2021  ? HCT 36.1 (L) 09/22/2021  ? MCV 93.5 09/22/2021  ? PLT 425 (H) 09/22/2021  ? ?  Chemistry   ?   ?Component Value Date/Time  ? NA 135 09/22/2021 1314  ? NA 134 (L) 03/31/2014 1432  ? K 4.5 09/22/2021 1314  ? K 4.6 03/31/2014 1432  ? CL 100 09/22/2021 1314  ? CO2 25 09/22/2021 1314  ? CO2 23 03/31/2014 1432  ? BUN 20 09/22/2021 1314  ? BUN 17.2 03/31/2014 1432  ? CREATININE 1.11 09/22/2021 1314  ? CREATININE 1.0 03/31/2014 1432  ?    ?Component Value Date/Time  ? CALCIUM 9.4 09/22/2021 1314  ? CALCIUM 9.5  03/31/2014 1432  ? ALKPHOS 58  09/22/2021 1314  ? ALKPHOS 63 02/16/2014 1140  ? AST 18 09/22/2021 1314  ? AST 15 02/16/2014 1140  ? ALT 14 09/22/2021 1314  ? ALT 12 02/16/2014 1140  ? BILITOT 0.3 09/22/2021 1314  ? BILITOT 0.38 02/16/2014 1140  ?  ? ? ?Impression and Plan: ?Dustin Mccarthy is a 81 year old white male.  He has diabetes.  He has mild anemia.  He responded incredibly well to the Aranesp.  We will see what his iron studies look like.  I would think that they would be okay given that the MCV is 97. ? 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  Again I do not think we have to do a bone marrow biopsy on him since he responded so well to the Aranesp. ? ?I looked at his blood smear.  I really do not see anything that looked suspicious on the blood smear.  He had no nucleated red blood cells.  There were no target cells.  There is no rouleaux formation.  His white blood cells appear mature.  There were no hypersegmented polys. ? ?For right now, we will see how he does with oral iron.  Again, he is post to let us know if he wants to have a prescription for Fusion Plus. ? ?I would like to see him back in a couple months.  I know he will be busy with his poor wife.  Hopefully, she will improve.  I did give him a prayer blanket to give her. ? ? ? ?Volanda Napoleon, MD ?4/27/20232:08 PM ?

## 2021-09-23 ENCOUNTER — Telehealth: Payer: Self-pay | Admitting: *Deleted

## 2021-09-23 LAB — IRON AND IRON BINDING CAPACITY (CC-WL,HP ONLY)
Iron: 43 ug/dL — ABNORMAL LOW (ref 45–182)
Saturation Ratios: 14 % — ABNORMAL LOW (ref 17.9–39.5)
TIBC: 314 ug/dL (ref 250–450)
UIBC: 271 ug/dL (ref 117–376)

## 2021-09-23 NOTE — Telephone Encounter (Signed)
-----   Message from Volanda Napoleon, MD sent at 09/23/2021  9:24 AM EDT ----- ?Call - the iron is low.  Lets see if the Fusion Plus will work.  Call us if you have a hard time getting the Fusion Plus!!   Dustin Mccarthy ?

## 2021-09-23 NOTE — Telephone Encounter (Signed)
As noted below by Dr. Marin Olp, I tried calling patient but his voice mail box is full and unable to leave a message. He needs to know that his iron is low. Dr. Marin Olp wants to see if the Fusion Plus will work first. If he has a hard time getting the Fusion Plus, please call the office and let us know.  ?

## 2021-11-17 DIAGNOSIS — E785 Hyperlipidemia, unspecified: Secondary | ICD-10-CM | POA: Diagnosis not present

## 2021-11-17 DIAGNOSIS — M791 Myalgia, unspecified site: Secondary | ICD-10-CM | POA: Diagnosis not present

## 2021-11-17 DIAGNOSIS — E11319 Type 2 diabetes mellitus with unspecified diabetic retinopathy without macular edema: Secondary | ICD-10-CM | POA: Diagnosis not present

## 2021-11-17 DIAGNOSIS — I10 Essential (primary) hypertension: Secondary | ICD-10-CM | POA: Diagnosis not present

## 2021-11-17 DIAGNOSIS — T466X5A Adverse effect of antihyperlipidemic and antiarteriosclerotic drugs, initial encounter: Secondary | ICD-10-CM | POA: Diagnosis not present

## 2021-11-17 DIAGNOSIS — Z794 Long term (current) use of insulin: Secondary | ICD-10-CM | POA: Diagnosis not present

## 2021-11-17 DIAGNOSIS — I6529 Occlusion and stenosis of unspecified carotid artery: Secondary | ICD-10-CM | POA: Diagnosis not present

## 2021-11-17 DIAGNOSIS — D509 Iron deficiency anemia, unspecified: Secondary | ICD-10-CM | POA: Diagnosis not present

## 2021-12-21 ENCOUNTER — Encounter: Payer: Self-pay | Admitting: Hematology & Oncology

## 2021-12-21 ENCOUNTER — Inpatient Hospital Stay: Payer: Medicare Other | Attending: Hematology & Oncology

## 2021-12-21 ENCOUNTER — Other Ambulatory Visit: Payer: Self-pay

## 2021-12-21 ENCOUNTER — Inpatient Hospital Stay (HOSPITAL_BASED_OUTPATIENT_CLINIC_OR_DEPARTMENT_OTHER): Payer: Medicare Other | Admitting: Hematology & Oncology

## 2021-12-21 VITALS — BP 154/64 | HR 63 | Temp 98.0°F | Resp 18 | Ht 66.0 in | Wt 157.8 lb

## 2021-12-21 DIAGNOSIS — D500 Iron deficiency anemia secondary to blood loss (chronic): Secondary | ICD-10-CM

## 2021-12-21 DIAGNOSIS — E1122 Type 2 diabetes mellitus with diabetic chronic kidney disease: Secondary | ICD-10-CM | POA: Diagnosis not present

## 2021-12-21 DIAGNOSIS — D509 Iron deficiency anemia, unspecified: Secondary | ICD-10-CM | POA: Diagnosis not present

## 2021-12-21 DIAGNOSIS — N189 Chronic kidney disease, unspecified: Secondary | ICD-10-CM | POA: Insufficient documentation

## 2021-12-21 DIAGNOSIS — D631 Anemia in chronic kidney disease: Secondary | ICD-10-CM | POA: Diagnosis not present

## 2021-12-21 LAB — CBC WITH DIFFERENTIAL (CANCER CENTER ONLY)
Abs Immature Granulocytes: 0.07 10*3/uL (ref 0.00–0.07)
Basophils Absolute: 0 10*3/uL (ref 0.0–0.1)
Basophils Relative: 0 %
Eosinophils Absolute: 0 10*3/uL (ref 0.0–0.5)
Eosinophils Relative: 0 %
HCT: 33.5 % — ABNORMAL LOW (ref 39.0–52.0)
Hemoglobin: 11 g/dL — ABNORMAL LOW (ref 13.0–17.0)
Immature Granulocytes: 1 %
Lymphocytes Relative: 26 %
Lymphs Abs: 2.5 10*3/uL (ref 0.7–4.0)
MCH: 31.9 pg (ref 26.0–34.0)
MCHC: 32.8 g/dL (ref 30.0–36.0)
MCV: 97.1 fL (ref 80.0–100.0)
Monocytes Absolute: 0.6 10*3/uL (ref 0.1–1.0)
Monocytes Relative: 7 %
Neutro Abs: 6.3 10*3/uL (ref 1.7–7.7)
Neutrophils Relative %: 66 %
Platelet Count: 365 10*3/uL (ref 150–400)
RBC: 3.45 MIL/uL — ABNORMAL LOW (ref 4.22–5.81)
RDW: 14.2 % (ref 11.5–15.5)
WBC Count: 9.6 10*3/uL (ref 4.0–10.5)
nRBC: 0 % (ref 0.0–0.2)

## 2021-12-21 LAB — CMP (CANCER CENTER ONLY)
ALT: 10 U/L (ref 0–44)
AST: 15 U/L (ref 15–41)
Albumin: 4.1 g/dL (ref 3.5–5.0)
Alkaline Phosphatase: 52 U/L (ref 38–126)
Anion gap: 9 (ref 5–15)
BUN: 22 mg/dL (ref 8–23)
CO2: 23 mmol/L (ref 22–32)
Calcium: 9.6 mg/dL (ref 8.9–10.3)
Chloride: 102 mmol/L (ref 98–111)
Creatinine: 1.12 mg/dL (ref 0.61–1.24)
GFR, Estimated: >60 mL/min (ref >60)
Glucose, Bld: 146 mg/dL — ABNORMAL HIGH (ref 70–99)
Potassium: 4.6 mmol/L (ref 3.5–5.1)
Sodium: 134 mmol/L — ABNORMAL LOW (ref 135–145)
Total Bilirubin: 0.3 mg/dL (ref 0.3–1.2)
Total Protein: 6.4 g/dL — ABNORMAL LOW (ref 6.5–8.1)

## 2021-12-21 LAB — RETICULOCYTES
Immature Retic Fract: 13.1 % (ref 2.3–15.9)
RBC.: 3.44 MIL/uL — ABNORMAL LOW (ref 4.22–5.81)
Retic Count, Absolute: 47.1 10*3/uL (ref 19.0–186.0)
Retic Ct Pct: 1.4 % (ref 0.4–3.1)

## 2021-12-21 LAB — FERRITIN: Ferritin: 62 ng/mL (ref 24–336)

## 2021-12-21 NOTE — Progress Notes (Signed)
Hematology and Oncology Follow Up Visit  Dustin Mccarthy [id number] 09-01-1940 81 y.o. 12/21/2021   Principle Diagnosis:  Iron deficiency Anemia Erythropoietin def Anemia  Current Therapy:   IV iron - Injectafer - given on 07/11/2019 Aranesp 300 mcg sq for Hgb <11 -- dose given 07/13/2021     Interim History:  Dustin Mccarthy is back for a follow-up.  I hate to say but his wife passed on in 2022-11-10.  She had problems with myasthenia.  She just was starting to shut down.  She had renal failure.  She was on dialysis.  Her husband had to make the incredibly hard decision to pull back on treatment and she passed on.  This is still quite hard for him.  I totally understand this.  He is really not eating all that much.  I know it is hard for him to fix food for himself.  Thankfully, his neighbors are helping out.  He last got iron a couple years ago.  Back when we saw him in April, his ferritin was 57 with an iron saturation of 14%.    He has had no problems with cough or shortness of breath.  He has had no rashes.  There is been no change in bowel or bladder habits.  There is been no leg swelling.  Overall, I was his performance status is ECOG 1.      Medications:  Current Outpatient Medications:    ACCU-CHEK AVIVA PLUS test strip, 1 strip by Other route 2 (two) times daily. Use 1 strip to check glucose twice a day, Disp: , Rfl: 0   acyclovir (ZOVIRAX) 400 MG tablet, Take 400 mg by mouth 2 (two) times daily., Disp: , Rfl:    APPLE CIDER VINEGAR PO, Take by mouth. 250 mg daily, Disp: , Rfl:    aspirin 81 MG chewable tablet, CHEW ONE TABLET BY MOUTH DAILY, Disp: , Rfl:    Cholecalciferol 50 MCG (2000 UT) TABS, Take 1 tablet by mouth daily., Disp: , Rfl:    Dulaglutide (TRULICITY [place]), Inject into the skin., Disp: , Rfl:    ferrous sulfate 325 (65 FE) MG tablet, every other day., Disp: , Rfl:    finasteride (PROSCAR) 5 MG tablet, Take 5 mg by mouth daily., Disp: , Rfl:    folic acid (FOLVITE) 1 MG  tablet, 1 tablet, Disp: , Rfl:    Insulin Glargine (BASAGLAR KWIKPEN [place]), Inject 8 Units into the skin. 7 units daily, Disp: , Rfl:    metFORMIN (GLUCOPHAGE) 1000 MG tablet, Take 1,000 mg by mouth 2 (two) times daily with a meal., Disp: , Rfl:    olmesartan (BENICAR) 40 MG tablet, Take 40 mg by mouth daily., Disp: , Rfl:    pantoprazole (PROTONIX) 40 MG tablet, Take 40 mg by mouth daily., Disp: , Rfl:    tamsulosin (FLOMAX) 0.4 MG CAPS capsule, Take 0.4 mg by mouth daily., Disp: , Rfl:    Thiamine HCl (VITAMIN B-1 PO), Take by mouth daily., Disp: , Rfl:    TURMERIC PO, Take by mouth. 1000 mg daily, Disp: , Rfl:    vitamin B-12 (CYANOCOBALAMIN) 1000 MCG tablet, Take 1,000 mcg by mouth daily. Every other day, Disp: , Rfl:    Zinc 50 MG TABS, Take 1 tablet by mouth daily at 6 (six) AM., Disp: , Rfl:   Allergies:  Allergies  Allergen Reactions   Bee Venom Anaphylaxis   Hydrochlorothiazide Other (See Comments)    Dizzy   Oxycodone Nausea And Vomiting  Iodinated Contrast Media Rash   Iodine Other (See Comments)    UNKNOWN REACTION   Povidone Iodine Other (See Comments)    UNKNOWN REACTION    Past Medical History, Surgical history, Social history, and Family History were reviewed and updated.  Review of Systems: Review of Systems  Constitutional:  Positive for fatigue.  HENT:  Negative.    Eyes: Negative.   Respiratory: Negative.    Cardiovascular: Negative.   Gastrointestinal:  Positive for constipation.  Endocrine: Negative.   Genitourinary: Negative.    Musculoskeletal:  Positive for arthralgias and myalgias.  Skin: Negative.   Neurological:  Positive for light-headedness.  Hematological: Negative.   Psychiatric/Behavioral: Negative.      Physical Exam:  height is 5' 6" (1.676 m) and weight is 157 lb 12.8 oz (71.6 kg). His oral temperature is 98 F (36.7 C). His blood pressure is 154/64 (abnormal) and his pulse is 63. His respiration is 18 and oxygen saturation is 100%.    Wt Readings from Last 3 Encounters:  12/21/21 157 lb 12.8 oz (71.6 kg)  09/22/21 160 lb (72.6 kg)  08/03/21 163 lb 1.9 oz (74 kg)    Physical Exam Vitals reviewed.  HENT:     Head: Normocephalic and atraumatic.  Eyes:     Pupils: Pupils are equal, round, and reactive to light.  Cardiovascular:     Rate and Rhythm: Normal rate and regular rhythm.     Heart sounds: Normal heart sounds.  Pulmonary:     Effort: Pulmonary effort is normal.     Breath sounds: Normal breath sounds.  Abdominal:     General: Bowel sounds are normal.     Palpations: Abdomen is soft.  Musculoskeletal:        General: No tenderness or deformity. Normal range of motion.     Cervical back: Normal range of motion.  Lymphadenopathy:     Cervical: No cervical adenopathy.  Skin:    General: Skin is warm and dry.     Findings: No erythema or rash.  Neurological:     Mental Status: He is alert and oriented to person, place, and time.  Psychiatric:        Behavior: Behavior normal.        Thought Content: Thought content normal.        Judgment: Judgment normal.     Lab Results  Component Value Date   WBC 9.6 12/21/2021   HGB 11.0 (L) 12/21/2021   HCT 33.5 (L) 12/21/2021   MCV 97.1 12/21/2021   PLT 365 12/21/2021     Chemistry      Component Value Date/Time   NA 135 09/22/2021 1314   NA 134 (L) 03/31/2014 1432   K 4.5 09/22/2021 1314   K 4.6 03/31/2014 1432   CL 100 09/22/2021 1314   CO2 25 09/22/2021 1314   CO2 23 03/31/2014 1432   BUN 20 09/22/2021 1314   BUN 17.2 03/31/2014 1432   CREATININE 1.11 09/22/2021 1314   CREATININE 1.0 03/31/2014 1432      Component Value Date/Time   CALCIUM 9.4 09/22/2021 1314   CALCIUM 9.5 03/31/2014 1432   ALKPHOS 58 09/22/2021 1314   ALKPHOS 63 02/16/2014 1140   AST 18 09/22/2021 1314   AST 15 02/16/2014 1140   ALT 14 09/22/2021 1314   ALT 12 02/16/2014 1140   BILITOT 0.3 09/22/2021 1314   BILITOT 0.38 02/16/2014 1140      Impression and  Plan: Dustin Mccarthy is a  81 year old white male.  He has diabetes.  He has mild anemia.  He responded incredibly well to the Aranesp.  We will see what his iron studies look like.  I would think that they would be okay given that the MCV is 97.  I suppose that he may have a component of myelodysplasia.  Again I do not think we have to do a bone marrow biopsy on him since he responded so well to the Aranesp.  I looked at his blood smear.  I really do not see anything that looked suspicious on the blood smear.  He had no nucleated red blood cells.  There were no target cells.  There is no rouleaux formation.  His white blood cells appear mature.  There were no hypersegmented polys.  We will see what his iron studies look like.  He does not require any Aranesp today.  I just feel bad about his poor wife.  I know that she did her best and she now is with Jesus.  He takes comfort in knowing this.  We will plan to get him back to see [place] in another 6 weeks.    Volanda Napoleon, MD 7/26/20231:56 PM

## 2021-12-22 LAB — IRON AND IRON BINDING CAPACITY (CC-WL,HP ONLY)
Iron: 67 ug/dL (ref 45–182)
Saturation Ratios: 22 % (ref 17.9–39.5)
TIBC: 309 ug/dL (ref 250–450)
UIBC: 242 ug/dL (ref 117–376)

## 2022-01-13 DIAGNOSIS — H2512 Age-related nuclear cataract, left eye: Secondary | ICD-10-CM | POA: Diagnosis not present

## 2022-01-13 DIAGNOSIS — H26491 Other secondary cataract, right eye: Secondary | ICD-10-CM | POA: Diagnosis not present

## 2022-01-13 DIAGNOSIS — Z961 Presence of intraocular lens: Secondary | ICD-10-CM | POA: Diagnosis not present

## 2022-01-13 DIAGNOSIS — H35351 Cystoid macular degeneration, right eye: Secondary | ICD-10-CM | POA: Diagnosis not present

## 2022-02-01 ENCOUNTER — Inpatient Hospital Stay: Payer: Medicare Other

## 2022-02-01 ENCOUNTER — Inpatient Hospital Stay (HOSPITAL_BASED_OUTPATIENT_CLINIC_OR_DEPARTMENT_OTHER): Payer: Medicare Other | Admitting: Hematology & Oncology

## 2022-02-01 ENCOUNTER — Encounter: Payer: Self-pay | Admitting: Hematology & Oncology

## 2022-02-01 ENCOUNTER — Inpatient Hospital Stay: Payer: Medicare Other | Attending: Hematology & Oncology

## 2022-02-01 ENCOUNTER — Other Ambulatory Visit: Payer: Self-pay

## 2022-02-01 VITALS — BP 150/67 | HR 63 | Temp 98.3°F | Resp 16 | Wt 162.0 lb

## 2022-02-01 DIAGNOSIS — N189 Chronic kidney disease, unspecified: Secondary | ICD-10-CM | POA: Diagnosis not present

## 2022-02-01 DIAGNOSIS — D631 Anemia in chronic kidney disease: Secondary | ICD-10-CM | POA: Insufficient documentation

## 2022-02-01 DIAGNOSIS — D500 Iron deficiency anemia secondary to blood loss (chronic): Secondary | ICD-10-CM | POA: Diagnosis not present

## 2022-02-01 DIAGNOSIS — E1122 Type 2 diabetes mellitus with diabetic chronic kidney disease: Secondary | ICD-10-CM | POA: Diagnosis not present

## 2022-02-01 DIAGNOSIS — D509 Iron deficiency anemia, unspecified: Secondary | ICD-10-CM | POA: Diagnosis not present

## 2022-02-01 LAB — CBC WITH DIFFERENTIAL (CANCER CENTER ONLY)
Abs Immature Granulocytes: 0.04 10*3/uL (ref 0.00–0.07)
Basophils Absolute: 0 10*3/uL (ref 0.0–0.1)
Basophils Relative: 0 %
Eosinophils Absolute: 0.1 10*3/uL (ref 0.0–0.5)
Eosinophils Relative: 1 %
HCT: 34 % — ABNORMAL LOW (ref 39.0–52.0)
Hemoglobin: 11.2 g/dL — ABNORMAL LOW (ref 13.0–17.0)
Immature Granulocytes: 0 %
Lymphocytes Relative: 28 %
Lymphs Abs: 2.8 10*3/uL (ref 0.7–4.0)
MCH: 31.9 pg (ref 26.0–34.0)
MCHC: 32.9 g/dL (ref 30.0–36.0)
MCV: 96.9 fL (ref 80.0–100.0)
Monocytes Absolute: 0.7 10*3/uL (ref 0.1–1.0)
Monocytes Relative: 8 %
Neutro Abs: 6.2 10*3/uL (ref 1.7–7.7)
Neutrophils Relative %: 63 %
Platelet Count: 317 10*3/uL (ref 150–400)
RBC: 3.51 MIL/uL — ABNORMAL LOW (ref 4.22–5.81)
RDW: 13.1 % (ref 11.5–15.5)
WBC Count: 9.9 10*3/uL (ref 4.0–10.5)
nRBC: 0 % (ref 0.0–0.2)

## 2022-02-01 LAB — CMP (CANCER CENTER ONLY)
ALT: 10 U/L (ref 0–44)
AST: 15 U/L (ref 15–41)
Albumin: 3.9 g/dL (ref 3.5–5.0)
Alkaline Phosphatase: 65 U/L (ref 38–126)
Anion gap: 9 (ref 5–15)
BUN: 25 mg/dL — ABNORMAL HIGH (ref 8–23)
CO2: 24 mmol/L (ref 22–32)
Calcium: 9.5 mg/dL (ref 8.9–10.3)
Chloride: 102 mmol/L (ref 98–111)
Creatinine: 1.1 mg/dL (ref 0.61–1.24)
GFR, Estimated: >60 mL/min (ref >60)
Glucose, Bld: 167 mg/dL — ABNORMAL HIGH (ref 70–99)
Potassium: 4.4 mmol/L (ref 3.5–5.1)
Sodium: 135 mmol/L (ref 135–145)
Total Bilirubin: 0.2 mg/dL — ABNORMAL LOW (ref 0.3–1.2)
Total Protein: 6.4 g/dL — ABNORMAL LOW (ref 6.5–8.1)

## 2022-02-01 LAB — RETICULOCYTES
Immature Retic Fract: 12.2 % (ref 2.3–15.9)
RBC.: 3.49 MIL/uL — ABNORMAL LOW (ref 4.22–5.81)
Retic Count, Absolute: 45.7 10*3/uL (ref 19.0–186.0)
Retic Ct Pct: 1.3 % (ref 0.4–3.1)

## 2022-02-01 LAB — FERRITIN: Ferritin: 28 ng/mL (ref 24–336)

## 2022-02-01 NOTE — Progress Notes (Signed)
Hematology and Oncology Follow Up Visit  Dustin Mccarthy [id number] [birth year] 81 y.o. 02/01/2022   Principle Diagnosis:  Iron deficiency Anemia Erythropoietin def Anemia  Current Therapy:   IV iron - Injectafer - given on 07/11/2019 Aranesp 300 mcg sq for Hgb <11 -- dose given 07/13/2021     Interim History:  Dustin Mccarthy is back for a follow-up.  He is managing fairly well.  Thankfully, his family is watching out for him since his wife passed on back in 2022/10/10.  He does have a house out [place].  He thinks about selling this.  He is not eating all that much.  Thankfully, his weight is not down.  Over the last saw him back in July, his ferritin was 62 with an iron saturation of 22%.  He has had no bleeding.  He has had no cough or shortness of breath.  He has had no leg swelling.  There has been no rashes.  Overall, I would have to say that his performance status is ECOG 0.    Medications:  Current Outpatient Medications:    ACCU-CHEK AVIVA PLUS test strip, 1 strip by Other route 2 (two) times daily. Use 1 strip to check glucose twice a day, Disp: , Rfl: 0   acyclovir (ZOVIRAX) 400 MG tablet, Take 400 mg by mouth 2 (two) times daily., Disp: , Rfl:    APPLE CIDER VINEGAR PO, Take by mouth. 250 mg daily, Disp: , Rfl:    aspirin 81 MG chewable tablet, CHEW ONE TABLET BY MOUTH DAILY, Disp: , Rfl:    Cholecalciferol 50 MCG (2000 UT) TABS, Take 1 tablet by mouth daily., Disp: , Rfl:    Dulaglutide (TRULICITY [place]), Inject into the skin., Disp: , Rfl:    ferrous sulfate 325 (65 FE) MG tablet, every other day., Disp: , Rfl:    finasteride (PROSCAR) 5 MG tablet, Take 5 mg by mouth daily., Disp: , Rfl:    folic acid (FOLVITE) 1 MG tablet, 1 tablet, Disp: , Rfl:    Insulin Glargine (BASAGLAR [place]PEN ), Inject 8 Units into the skin. 7 units daily, Disp: , Rfl:    metFORMIN (GLUCOPHAGE) 1000 MG tablet, Take 1,000 mg by mouth 2 (two) times daily with a meal., Disp: , Rfl:    olmesartan (BENICAR)  40 MG tablet, Take 40 mg by mouth daily., Disp: , Rfl:    pantoprazole (PROTONIX) 40 MG tablet, Take 40 mg by mouth daily., Disp: , Rfl:    tamsulosin (FLOMAX) 0.4 MG CAPS capsule, Take 0.4 mg by mouth daily., Disp: , Rfl:    Thiamine HCl (VITAMIN B-1 PO), Take by mouth daily., Disp: , Rfl:    TURMERIC PO, Take by mouth. 1000 mg daily, Disp: , Rfl:    vitamin B-12 (CYANOCOBALAMIN) 1000 MCG tablet, Take 1,000 mcg by mouth daily. Every other day, Disp: , Rfl:    Zinc 50 MG TABS, Take 1 tablet by mouth daily at 6 (six) AM., Disp: , Rfl:   Allergies:  Allergies  Allergen Reactions   Bee Venom Anaphylaxis   Hydrochlorothiazide Other (See Comments)    Dizzy   Oxycodone Nausea And Vomiting   Iodinated Contrast Media Rash   Iodine Other (See Comments)    UNKNOWN REACTION   Povidone Iodine Other (See Comments)    UNKNOWN REACTION    Past Medical History, Surgical history, Social history, and Family History were reviewed and updated.  Review of Systems: Review of Systems  Constitutional:  Positive for fatigue.  HENT:  Negative.    Eyes: Negative.   Respiratory: Negative.    Cardiovascular: Negative.   Gastrointestinal:  Positive for constipation.  Endocrine: Negative.   Genitourinary: Negative.    Musculoskeletal:  Positive for arthralgias and myalgias.  Skin: Negative.   Neurological:  Positive for light-headedness.  Hematological: Negative.   Psychiatric/Behavioral: Negative.      Physical Exam:  weight is 162 lb (73.5 kg). His oral temperature is 98.3 F (36.8 C). His blood pressure is 150/67 (abnormal) and his pulse is 63. His respiration is 16 and oxygen saturation is 97%.   Wt Readings from Last 3 Encounters:  02/01/22 162 lb (73.5 kg)  12/21/21 157 lb 12.8 oz (71.6 kg)  09/22/21 160 lb (72.6 kg)    Physical Exam Vitals reviewed.  HENT:     Head: Normocephalic and atraumatic.  Eyes:     Pupils: Pupils are equal, round, and reactive to light.  Cardiovascular:      Rate and Rhythm: Normal rate and regular rhythm.     Heart sounds: Normal heart sounds.  Pulmonary:     Effort: Pulmonary effort is normal.     Breath sounds: Normal breath sounds.  Abdominal:     General: Bowel sounds are normal.     Palpations: Abdomen is soft.  Musculoskeletal:        General: No tenderness or deformity. Normal range of motion.     Cervical back: Normal range of motion.  Lymphadenopathy:     Cervical: No cervical adenopathy.  Skin:    General: Skin is warm and dry.     Findings: No erythema or rash.  Neurological:     Mental Status: He is alert and oriented to person, place, and time.  Psychiatric:        Behavior: Behavior normal.        Thought Content: Thought content normal.        Judgment: Judgment normal.      Lab Results  Component Value Date   WBC 9.9 02/01/2022   HGB 11.2 (L) 02/01/2022   HCT 34.0 (L) 02/01/2022   MCV 96.9 02/01/2022   PLT 317 02/01/2022     Chemistry      Component Value Date/Time   NA 134 (L) 12/21/2021 1315   NA 134 (L) 03/31/2014 1432   K 4.6 12/21/2021 1315   K 4.6 03/31/2014 1432   CL 102 12/21/2021 1315   CO2 23 12/21/2021 1315   CO2 23 03/31/2014 1432   BUN 22 12/21/2021 1315   BUN 17.2 03/31/2014 1432   CREATININE 1.12 12/21/2021 1315   CREATININE 1.0 03/31/2014 1432      Component Value Date/Time   CALCIUM 9.6 12/21/2021 1315   CALCIUM 9.5 03/31/2014 1432   ALKPHOS 52 12/21/2021 1315   ALKPHOS 63 02/16/2014 1140   AST 15 12/21/2021 1315   AST 15 02/16/2014 1140   ALT 10 12/21/2021 1315   ALT 12 02/16/2014 1140   BILITOT 0.3 12/21/2021 1315   BILITOT 0.38 02/16/2014 1140      Impression and Plan: Dustin Mccarthy is a 81 year old white male.  He has diabetes.  He has mild anemia.  He responded incredibly well to the Aranesp.  We will see what his iron studies look like.  I would think that they would be okay given that the MCV is 81.  I suppose that he may have a component of myelodysplasia.  Again I  do not think we have to do a  bone marrow biopsy on him since he responded so well to the Aranesp.  I looked at his blood smear.  I really do not see anything that looked suspicious on the blood smear.  He had no nucleated red blood cells.  There were no target cells.  There is no rouleaux formation.  His white blood cells appear mature.  There were no hypersegmented polys.  I am glad that things are going little better for him since his wife passed on.  I know that she had a tough time in the end.  I know that he was with her throughout her entire ordeal.  I think we can move his appointment back a little bit.  We will try to get her back in a couple months.  We will try to get him back before the holiday season.    Volanda Napoleon, MD 9/6/20231:46 PM

## 2022-02-02 LAB — IRON AND IRON BINDING CAPACITY (CC-WL,HP ONLY)
Iron: 31 ug/dL — ABNORMAL LOW (ref 45–182)
Saturation Ratios: 9 % — ABNORMAL LOW (ref 17.9–39.5)
TIBC: 330 ug/dL (ref 250–450)
UIBC: 299 ug/dL (ref 117–376)

## 2022-02-07 DIAGNOSIS — I10 Essential (primary) hypertension: Secondary | ICD-10-CM | POA: Diagnosis not present

## 2022-02-07 DIAGNOSIS — D509 Iron deficiency anemia, unspecified: Secondary | ICD-10-CM | POA: Diagnosis not present

## 2022-02-07 DIAGNOSIS — E785 Hyperlipidemia, unspecified: Secondary | ICD-10-CM | POA: Diagnosis not present

## 2022-02-07 DIAGNOSIS — E11319 Type 2 diabetes mellitus with unspecified diabetic retinopathy without macular edema: Secondary | ICD-10-CM | POA: Diagnosis not present

## 2022-02-08 ENCOUNTER — Inpatient Hospital Stay: Payer: Medicare Other

## 2022-02-08 VITALS — BP 140/62 | HR 52 | Temp 98.7°F | Resp 17

## 2022-02-08 DIAGNOSIS — D500 Iron deficiency anemia secondary to blood loss (chronic): Secondary | ICD-10-CM

## 2022-02-08 DIAGNOSIS — D631 Anemia in chronic kidney disease: Secondary | ICD-10-CM | POA: Diagnosis not present

## 2022-02-08 DIAGNOSIS — E1122 Type 2 diabetes mellitus with diabetic chronic kidney disease: Secondary | ICD-10-CM | POA: Diagnosis not present

## 2022-02-08 DIAGNOSIS — D509 Iron deficiency anemia, unspecified: Secondary | ICD-10-CM | POA: Diagnosis not present

## 2022-02-08 DIAGNOSIS — N189 Chronic kidney disease, unspecified: Secondary | ICD-10-CM | POA: Diagnosis not present

## 2022-02-08 MED ORDER — SODIUM CHLORIDE 0.9 % IV SOLN: Freq: Once | INTRAVENOUS | Status: AC

## 2022-02-08 MED ORDER — SODIUM CHLORIDE 0.9 % IV SOLN
40.0000 mg | Freq: Once | INTRAVENOUS | Status: DC
  Filled 2022-02-08: qty 4

## 2022-02-08 MED ORDER — METHYLPREDNISOLONE SODIUM SUCC 125 MG IJ SOLR
125.0000 mg | Freq: Once | INTRAMUSCULAR | Status: AC
  Administered 2022-02-08: 125 mg via INTRAVENOUS
  Filled 2022-02-08: qty 2

## 2022-02-08 MED ORDER — SODIUM CHLORIDE 0.9 % IV SOLN
750.0000 mg | Freq: Once | INTRAVENOUS | Status: AC
  Administered 2022-02-08: 750 mg via INTRAVENOUS
  Filled 2022-02-08: qty 15

## 2022-02-08 MED ORDER — SODIUM CHLORIDE 0.9 % IV SOLN
40.0000 mg | Freq: Once | INTRAVENOUS | Status: AC
  Administered 2022-02-08: 40 mg via INTRAVENOUS
  Filled 2022-02-08: qty 4

## 2022-02-08 NOTE — Patient Instructions (Signed)
Ferric Carboxymaltose Injection What is this medication? FERRIC CARBOXYMALTOSE (FER ik kar BOX ee MAWL tose) treats low levels of iron in your body (iron deficiency anemia). Iron is a mineral that plays an important role in making red blood cells, which carry oxygen from your lungs to the rest of your body. This medicine may be used for other purposes; ask your health care provider or pharmacist if you have questions. COMMON BRAND NAME(S): Injectafer What should I tell my care team before I take this medication? They need to know if you have any of these conditions: High blood pressure High levels of iron in the blood An unusual or allergic reaction to iron, other medications, foods, dyes, or preservatives Pregnant or trying to get pregnant Breast-feeding How should I use this medication? This medication is injected into a vein. It is given by your care team in a hospital or clinic setting. Talk to your care team about the use of this medication in children. While it may be given to children as young as 1 year for selected conditions, precautions do apply. Overdosage: If you think you have taken too much of this medicine contact a poison control center or emergency room at once. NOTE: This medicine is only for you. Do not share this medicine with others. What if I miss a dose? Keep appointments for follow-up doses. It is important not to miss your dose. Call your care team if you are unable to keep an appointment. What may interact with this medication? Do not take this medication with any of the following medications: Deferoxamine Dimercaprol Other iron products This list may not describe all possible interactions. Give your health care provider a list of all the medicines, herbs, non-prescription drugs, or dietary supplements you use. Also tell them if you smoke, drink alcohol, or use illegal drugs. Some items may interact with your medicine. What should I watch for while using this  medication? Visit your care team for regular checks on your progress. Tell your care team if your symptoms do not start to get better or if they get worse. You may need blood work while you are taking this medication. You may need to eat more foods that contain iron. Talk to your care team. Foods that contain iron include whole grains/cereals, dried fruits, beans, or peas, leafy green vegetables, and organ meats (liver, kidney). What side effects may I notice from receiving this medication? Side effects that you should report to your care team as soon as possible: Allergic reactions--skin rash, itching, hives, swelling of the face, lips, tongue, or throat Increase in blood pressure Low blood pressure--dizziness, feeling faint or lightheaded, blurry vision Shortness of breath Side effects that usually do not require medical attention (report to your care team if they continue or are bothersome): Flushing Headache Joint pain Muscle pain Nausea Pain, redness, or irritation at injection site This list may not describe all possible side effects. Call your doctor for medical advice about side effects. You may report side effects to FDA at 1-800-FDA-1088. Where should I keep my medication? This medication is given in a hospital or clinic. It will not be stored at home. NOTE: This sheet is a summary. It may not cover all possible information. If you have questions about this medicine, talk to your doctor, pharmacist, or health care provider.  2023 Elsevier/Gold Standard (2011-12-28 00:00:00)  

## 2022-02-14 DIAGNOSIS — E11319 Type 2 diabetes mellitus with unspecified diabetic retinopathy without macular edema: Secondary | ICD-10-CM | POA: Diagnosis not present

## 2022-02-14 DIAGNOSIS — M791 Myalgia, unspecified site: Secondary | ICD-10-CM | POA: Diagnosis not present

## 2022-02-14 DIAGNOSIS — D509 Iron deficiency anemia, unspecified: Secondary | ICD-10-CM | POA: Diagnosis not present

## 2022-02-14 DIAGNOSIS — E785 Hyperlipidemia, unspecified: Secondary | ICD-10-CM | POA: Diagnosis not present

## 2022-02-14 DIAGNOSIS — I6529 Occlusion and stenosis of unspecified carotid artery: Secondary | ICD-10-CM | POA: Diagnosis not present

## 2022-02-14 DIAGNOSIS — Z794 Long term (current) use of insulin: Secondary | ICD-10-CM | POA: Diagnosis not present

## 2022-02-14 DIAGNOSIS — T466X5A Adverse effect of antihyperlipidemic and antiarteriosclerotic drugs, initial encounter: Secondary | ICD-10-CM | POA: Diagnosis not present

## 2022-02-14 DIAGNOSIS — I10 Essential (primary) hypertension: Secondary | ICD-10-CM | POA: Diagnosis not present

## 2022-03-01 DIAGNOSIS — E113211 Type 2 diabetes mellitus with mild nonproliferative diabetic retinopathy with macular edema, right eye: Secondary | ICD-10-CM | POA: Diagnosis not present

## 2022-03-01 DIAGNOSIS — H35041 Retinal micro-aneurysms, unspecified, right eye: Secondary | ICD-10-CM | POA: Diagnosis not present

## 2022-03-01 DIAGNOSIS — H31093 Other chorioretinal scars, bilateral: Secondary | ICD-10-CM | POA: Diagnosis not present

## 2022-03-01 DIAGNOSIS — H3582 Retinal ischemia: Secondary | ICD-10-CM | POA: Diagnosis not present

## 2022-03-01 DIAGNOSIS — Z961 Presence of intraocular lens: Secondary | ICD-10-CM | POA: Diagnosis not present

## 2022-04-04 DIAGNOSIS — L565 Disseminated superficial actinic porokeratosis (DSAP): Secondary | ICD-10-CM | POA: Diagnosis not present

## 2022-04-04 DIAGNOSIS — D1801 Hemangioma of skin and subcutaneous tissue: Secondary | ICD-10-CM | POA: Diagnosis not present

## 2022-04-04 DIAGNOSIS — L570 Actinic keratosis: Secondary | ICD-10-CM | POA: Diagnosis not present

## 2022-04-04 DIAGNOSIS — L853 Xerosis cutis: Secondary | ICD-10-CM | POA: Diagnosis not present

## 2022-04-04 DIAGNOSIS — D0339 Melanoma in situ of other parts of face: Secondary | ICD-10-CM | POA: Diagnosis not present

## 2022-04-04 DIAGNOSIS — Z85828 Personal history of other malignant neoplasm of skin: Secondary | ICD-10-CM | POA: Diagnosis not present

## 2022-04-04 DIAGNOSIS — L821 Other seborrheic keratosis: Secondary | ICD-10-CM | POA: Diagnosis not present

## 2022-04-04 DIAGNOSIS — L812 Freckles: Secondary | ICD-10-CM | POA: Diagnosis not present

## 2022-04-04 DIAGNOSIS — D485 Neoplasm of uncertain behavior of skin: Secondary | ICD-10-CM | POA: Diagnosis not present

## 2022-04-04 DIAGNOSIS — H61001 Unspecified perichondritis of right external ear: Secondary | ICD-10-CM | POA: Diagnosis not present

## 2022-04-06 ENCOUNTER — Encounter: Payer: Self-pay | Admitting: Hematology & Oncology

## 2022-04-06 ENCOUNTER — Inpatient Hospital Stay: Payer: Medicare Other

## 2022-04-06 ENCOUNTER — Inpatient Hospital Stay (HOSPITAL_BASED_OUTPATIENT_CLINIC_OR_DEPARTMENT_OTHER): Payer: Medicare Other | Admitting: Hematology & Oncology

## 2022-04-06 ENCOUNTER — Inpatient Hospital Stay: Payer: Medicare Other | Attending: Hematology & Oncology

## 2022-04-06 VITALS — BP 152/62 | HR 66 | Temp 98.1°F | Resp 20 | Ht 66.0 in | Wt 163.4 lb

## 2022-04-06 DIAGNOSIS — D631 Anemia in chronic kidney disease: Secondary | ICD-10-CM | POA: Diagnosis not present

## 2022-04-06 DIAGNOSIS — D500 Iron deficiency anemia secondary to blood loss (chronic): Secondary | ICD-10-CM | POA: Diagnosis not present

## 2022-04-06 DIAGNOSIS — E1122 Type 2 diabetes mellitus with diabetic chronic kidney disease: Secondary | ICD-10-CM | POA: Insufficient documentation

## 2022-04-06 DIAGNOSIS — N189 Chronic kidney disease, unspecified: Secondary | ICD-10-CM | POA: Insufficient documentation

## 2022-04-06 DIAGNOSIS — D509 Iron deficiency anemia, unspecified: Secondary | ICD-10-CM | POA: Diagnosis not present

## 2022-04-06 LAB — RETICULOCYTES
Immature Retic Fract: 7.5 % (ref 2.3–15.9)
RBC.: 3.84 MIL/uL — ABNORMAL LOW (ref 4.22–5.81)
Retic Count, Absolute: 48.8 10*3/uL (ref 19.0–186.0)
Retic Ct Pct: 1.3 % (ref 0.4–3.1)

## 2022-04-06 LAB — CMP (CANCER CENTER ONLY)
ALT: 9 U/L (ref 0–44)
AST: 15 U/L (ref 15–41)
Albumin: 4.3 g/dL (ref 3.5–5.0)
Alkaline Phosphatase: 54 U/L (ref 38–126)
Anion gap: 9 (ref 5–15)
BUN: 27 mg/dL — ABNORMAL HIGH (ref 8–23)
CO2: 25 mmol/L (ref 22–32)
Calcium: 9.5 mg/dL (ref 8.9–10.3)
Chloride: 97 mmol/L — ABNORMAL LOW (ref 98–111)
Creatinine: 1.16 mg/dL (ref 0.61–1.24)
GFR, Estimated: >60 mL/min (ref >60)
Glucose, Bld: 127 mg/dL — ABNORMAL HIGH (ref 70–99)
Potassium: 4.4 mmol/L (ref 3.5–5.1)
Sodium: 131 mmol/L — ABNORMAL LOW (ref 135–145)
Total Bilirubin: 0.4 mg/dL (ref 0.3–1.2)
Total Protein: 7 g/dL (ref 6.5–8.1)

## 2022-04-06 LAB — IRON AND IRON BINDING CAPACITY (CC-WL,HP ONLY)
Iron: 80 ug/dL (ref 45–182)
Saturation Ratios: 27 % (ref 17.9–39.5)
TIBC: 302 ug/dL (ref 250–450)
UIBC: 222 ug/dL (ref 117–376)

## 2022-04-06 LAB — CBC WITH DIFFERENTIAL (CANCER CENTER ONLY)
Abs Immature Granulocytes: 0.06 10*3/uL (ref 0.00–0.07)
Basophils Absolute: 0.1 10*3/uL (ref 0.0–0.1)
Basophils Relative: 0 %
Eosinophils Absolute: 0.1 10*3/uL (ref 0.0–0.5)
Eosinophils Relative: 1 %
HCT: 37.4 % — ABNORMAL LOW (ref 39.0–52.0)
Hemoglobin: 12.3 g/dL — ABNORMAL LOW (ref 13.0–17.0)
Immature Granulocytes: 1 %
Lymphocytes Relative: 24 %
Lymphs Abs: 2.8 10*3/uL (ref 0.7–4.0)
MCH: 31.6 pg (ref 26.0–34.0)
MCHC: 32.9 g/dL (ref 30.0–36.0)
MCV: 96.1 fL (ref 80.0–100.0)
Monocytes Absolute: 1 10*3/uL (ref 0.1–1.0)
Monocytes Relative: 9 %
Neutro Abs: 7.6 10*3/uL (ref 1.7–7.7)
Neutrophils Relative %: 65 %
Platelet Count: 365 10*3/uL (ref 150–400)
RBC: 3.89 MIL/uL — ABNORMAL LOW (ref 4.22–5.81)
RDW: 13.2 % (ref 11.5–15.5)
WBC Count: 11.6 10*3/uL — ABNORMAL HIGH (ref 4.0–10.5)
nRBC: 0 % (ref 0.0–0.2)

## 2022-04-06 LAB — FERRITIN: Ferritin: 271 ng/mL (ref 24–336)

## 2022-04-06 NOTE — Progress Notes (Signed)
Hematology and Oncology Follow Up Visit  Dustin Mccarthy [id number] March 15, 1941 81 y.o. 04/06/2022   Principle Diagnosis:  Iron deficiency Anemia Erythropoietin def Anemia  Current Therapy:   IV iron - Injectafer - given on 02/08/2022  Aranesp 300 mcg sq for Hgb <11 -- dose given 07/13/2021     Interim History:  Dustin Mccarthy is back for a follow-up.  He is still managing as well as possible.  His wife passed on 6 months ago.  Thankfully, his family is doing a really great job trying to make sure he is does okay.  He is eating well.  He is having no problems with nausea or vomiting.  He is having no change in bowel or bladder habits.  He has had no issues with fever.  He has had no problems with COVID.  We did have to give iron back in September.  At that time, his ferritin was 28 with iron saturation of 9%.  He has responded as well as expected.  I do not think we do any traveling over the holiday season.  Currently, I would say that his performance status is probably ECOG 1.    Medications:  Current Outpatient Medications:    ACCU-CHEK AVIVA PLUS test strip, 1 strip by Other route 2 (two) times daily. Use 1 strip to check glucose twice a day, Disp: , Rfl: 0   acyclovir (ZOVIRAX) 400 MG tablet, Take 400 mg by mouth 2 (two) times daily., Disp: , Rfl:    APPLE CIDER VINEGAR PO, Take by mouth. 250 mg daily, Disp: , Rfl:    aspirin 81 MG chewable tablet, every other day., Disp: , Rfl:    Cholecalciferol 50 MCG (2000 UT) TABS, Take 1 tablet by mouth daily., Disp: , Rfl:    Dulaglutide (TRULICITY [place]), Inject 1.5 mg into the skin once a week., Disp: , Rfl:    ezetimibe (ZETIA) 10 MG tablet, 1 tablet Orally Once a day for 90 days, Disp: , Rfl:    ferrous sulfate 325 (65 FE) MG tablet, every other day., Disp: , Rfl:    finasteride (PROSCAR) 5 MG tablet, Take 5 mg by mouth daily., Disp: , Rfl:    folic acid (FOLVITE) 1 MG tablet, 1 tablet, Disp: , Rfl:    glucose blood test strip, 1 strip In  Vitro three times daily; 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 1000 mg daily, Disp: , Rfl:    vitamin B-12 (CYANOCOBALAMIN) 1000 MCG tablet, Take 1,000 mcg by mouth. Every other day, Disp: , Rfl:    Zinc 50 MG TABS, Take 1 tablet by mouth daily at 6 (six) AM., Disp: , Rfl:   Allergies:  Allergies  Allergen Reactions   Bee Venom Anaphylaxis   Hydrochlorothiazide Other (See Comments)    Dizzy   Oxycodone  Nausea And Vomiting   Iodinated Contrast Media Rash   Iodine Other (See Comments)    UNKNOWN REACTION   Povidone Iodine Other (See Comments)    UNKNOWN REACTION    Past Medical History, Surgical history, Social history, and Family History were reviewed and updated.  Review of Systems: Review of Systems  Constitutional:  Positive for fatigue.  HENT:  Negative.    Eyes: Negative.   Respiratory: Negative.    Cardiovascular: Negative.   Gastrointestinal:  Positive for constipation.  Endocrine: Negative.   Genitourinary: Negative.    Musculoskeletal:  Positive for arthralgias and myalgias.  Skin: Negative.   Neurological:  Positive for light-headedness.  Hematological: Negative.   Psychiatric/Behavioral: Negative.      Physical Exam:  height is '5\' 6"'$  (1.676 m) and weight is 163 lb 6.4 oz (74.1 kg). His oral temperature is 98.1 F (36.7 C). His blood pressure is 152/62 (abnormal) and his pulse is 66. His respiration is 20 and oxygen  saturation is 100%.   Wt Readings from Last 3 Encounters:  04/06/22 163 lb 6.4 oz (74.1 kg)  02/01/22 162 lb (73.5 kg)  12/21/21 157 lb 12.8 oz (71.6 kg)    Physical Exam Vitals reviewed.  HENT:     Head: Normocephalic and atraumatic.  Eyes:     Pupils: Pupils are equal, round, and reactive to light.  Cardiovascular:     Rate and Rhythm: Normal rate and regular rhythm.     Heart sounds: Normal heart sounds.  Pulmonary:     Effort: Pulmonary effort is normal.     Breath sounds: Normal breath sounds.  Abdominal:     General: Bowel sounds are normal.     Palpations: Abdomen is soft.  Musculoskeletal:        General: No tenderness or deformity. Normal range of motion.     Cervical back: Normal range of motion.  Lymphadenopathy:     Cervical: No cervical adenopathy.  Skin:    General: Skin is warm and dry.     Findings: No erythema or rash.  Neurological:     Mental Status: He is alert and oriented to person, place, and time.  Psychiatric:        Behavior: Behavior normal.        Thought Content: Thought content normal.        Judgment: Judgment normal.      Lab Results  Component Value Date   WBC 11.6 (H) 04/06/2022   HGB 12.3 (L) 04/06/2022   HCT 37.4 (L) 04/06/2022   MCV 96.1 04/06/2022   PLT 365 04/06/2022     Chemistry      Component Value Date/Time   NA 135 02/01/2022 1314   NA 134 (L) 03/31/2014 1432   K 4.4 02/01/2022 1314   K 4.6 03/31/2014 1432   CL 102 02/01/2022 1314   CO2 24 02/01/2022 1314   CO2 23 03/31/2014 1432   BUN 25 (H) 02/01/2022 1314   BUN 17.2 03/31/2014 1432   CREATININE 1.10 02/01/2022 1314   CREATININE 1.0 03/31/2014 1432      Component Value Date/Time   CALCIUM 9.5 02/01/2022 1314   CALCIUM 9.5 03/31/2014 1432   ALKPHOS 65 02/01/2022 1314   ALKPHOS 63 02/16/2014 1140   AST 15 02/01/2022 1314   AST 15 02/16/2014 1140   ALT 10 02/01/2022 1314   ALT 12 02/16/2014 1140   BILITOT 0.2 (L) 02/01/2022 1314   BILITOT 0.38  02/16/2014 1140  Impression and Plan: Dustin Mccarthy is a 81 year old white male.  He has diabetes.  He has mild anemia.  He responded incredibly well to the Aranesp.  He responded well to iron.  He does not need any injections today.  I think we can probably get him back after the Winter.  We will plan to get him back in March 2024.  I think we can wait that long.  We may have to give him an injection at that time.     Volanda Napoleon, MD 11/9/20231:11 PM

## 2022-04-13 DIAGNOSIS — N401 Enlarged prostate with lower urinary tract symptoms: Secondary | ICD-10-CM | POA: Diagnosis not present

## 2022-04-13 DIAGNOSIS — R3912 Poor urinary stream: Secondary | ICD-10-CM | POA: Diagnosis not present

## 2022-04-24 DIAGNOSIS — Z85828 Personal history of other malignant neoplasm of skin: Secondary | ICD-10-CM | POA: Diagnosis not present

## 2022-04-24 DIAGNOSIS — D0339 Melanoma in situ of other parts of face: Secondary | ICD-10-CM | POA: Diagnosis not present

## 2022-05-08 DIAGNOSIS — Z961 Presence of intraocular lens: Secondary | ICD-10-CM | POA: Diagnosis not present

## 2022-05-08 DIAGNOSIS — H31093 Other chorioretinal scars, bilateral: Secondary | ICD-10-CM | POA: Diagnosis not present

## 2022-05-08 DIAGNOSIS — H25812 Combined forms of age-related cataract, left eye: Secondary | ICD-10-CM | POA: Diagnosis not present

## 2022-05-08 DIAGNOSIS — E113292 Type 2 diabetes mellitus with mild nonproliferative diabetic retinopathy without macular edema, left eye: Secondary | ICD-10-CM | POA: Diagnosis not present

## 2022-05-08 DIAGNOSIS — E113311 Type 2 diabetes mellitus with moderate nonproliferative diabetic retinopathy with macular edema, right eye: Secondary | ICD-10-CM | POA: Diagnosis not present

## 2022-05-08 DIAGNOSIS — H3582 Retinal ischemia: Secondary | ICD-10-CM | POA: Diagnosis not present

## 2022-05-08 DIAGNOSIS — H35033 Hypertensive retinopathy, bilateral: Secondary | ICD-10-CM | POA: Diagnosis not present

## 2022-05-09 DIAGNOSIS — E785 Hyperlipidemia, unspecified: Secondary | ICD-10-CM | POA: Diagnosis not present

## 2022-05-09 DIAGNOSIS — E11319 Type 2 diabetes mellitus with unspecified diabetic retinopathy without macular edema: Secondary | ICD-10-CM | POA: Diagnosis not present

## 2022-05-09 DIAGNOSIS — I10 Essential (primary) hypertension: Secondary | ICD-10-CM | POA: Diagnosis not present

## 2022-05-09 DIAGNOSIS — D509 Iron deficiency anemia, unspecified: Secondary | ICD-10-CM | POA: Diagnosis not present

## 2022-05-15 DIAGNOSIS — E113311 Type 2 diabetes mellitus with moderate nonproliferative diabetic retinopathy with macular edema, right eye: Secondary | ICD-10-CM | POA: Diagnosis not present

## 2022-05-15 IMAGING — DX DG CHEST 2V
2 series · 2 of 2 positions shown · non-contrast
Comparison: Chest x-ray 04/07/2009.

CLINICAL DATA: 79-year-old male with history of dyspnea on
exertion.

EXAM:
CHEST - 2 VIEW

[chest pa]
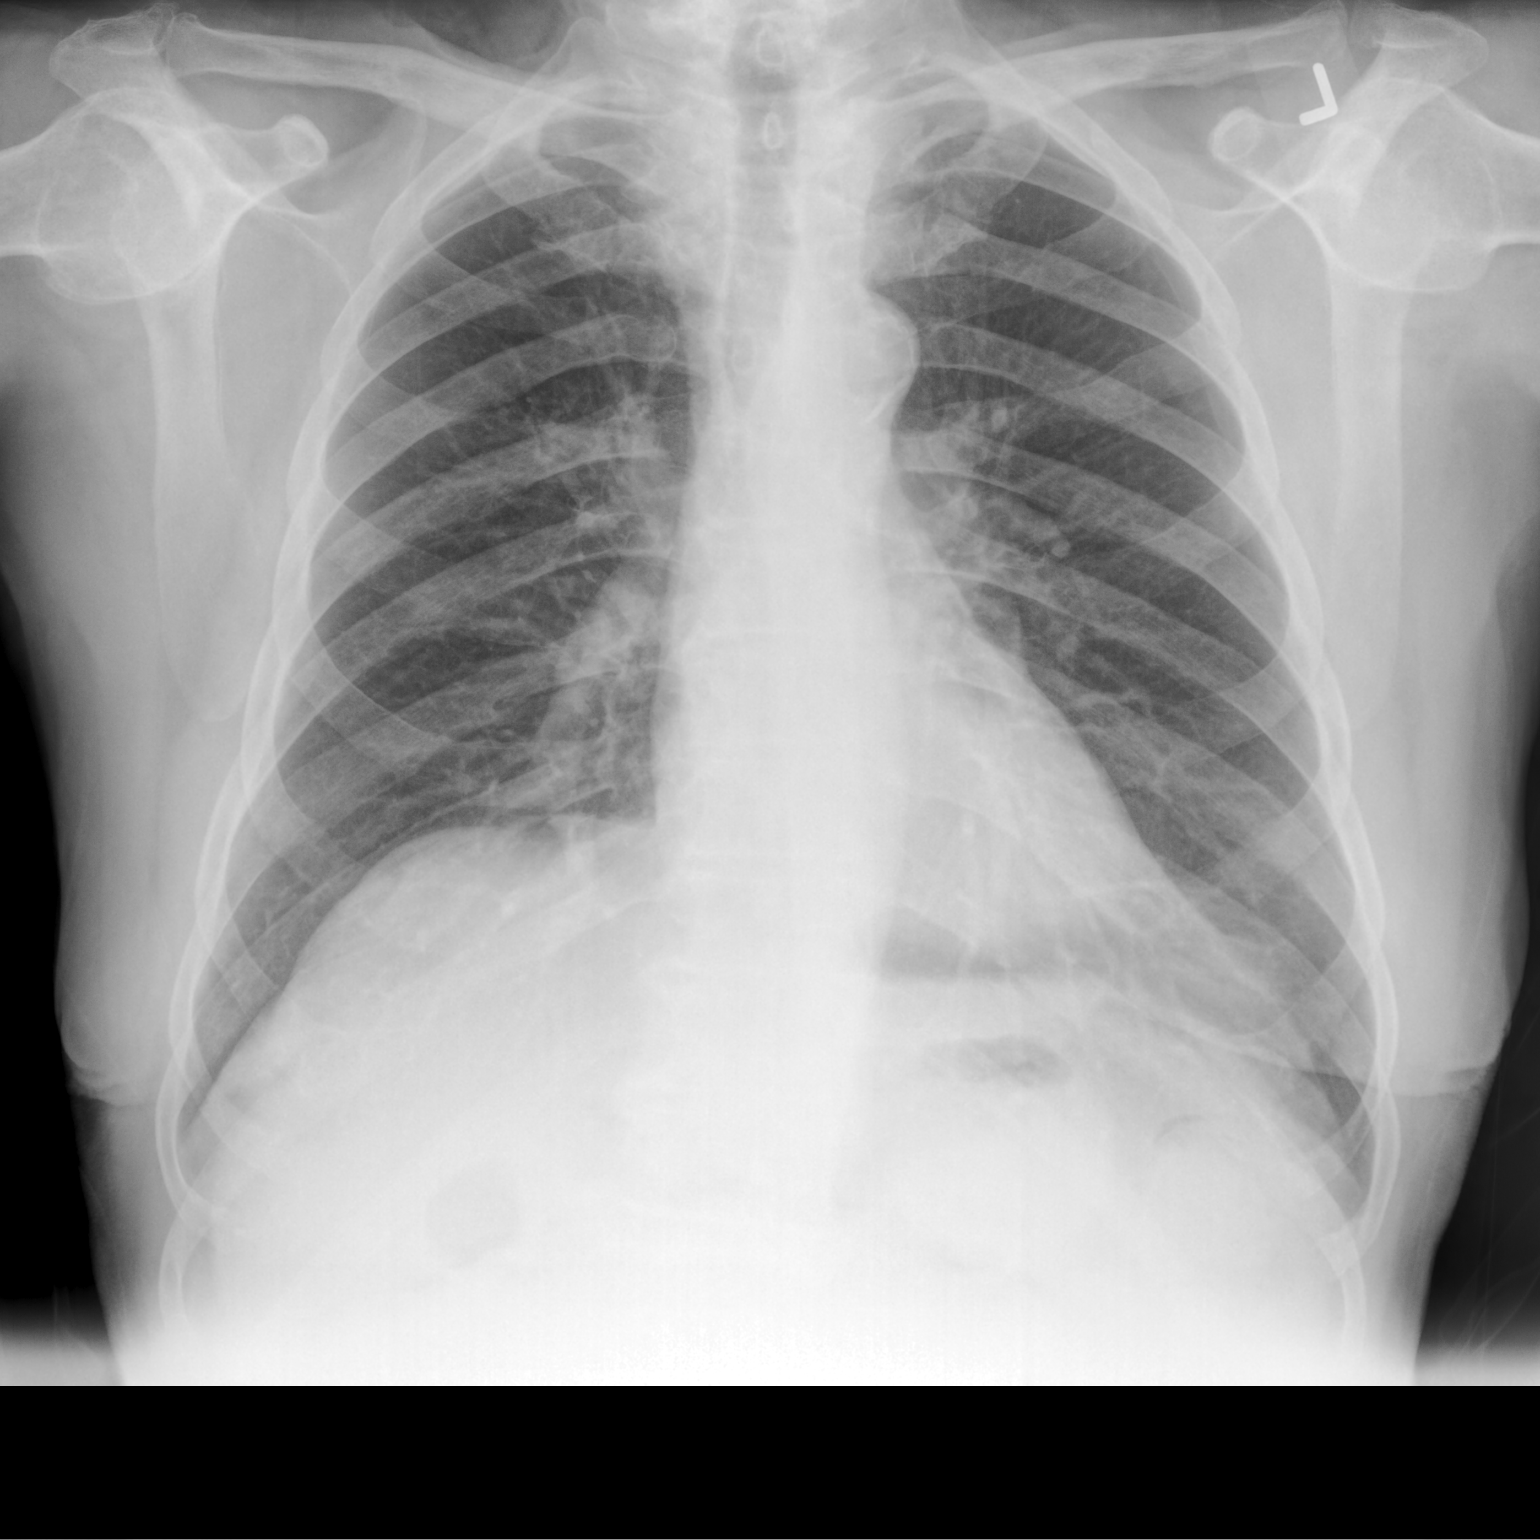

[chest lat]
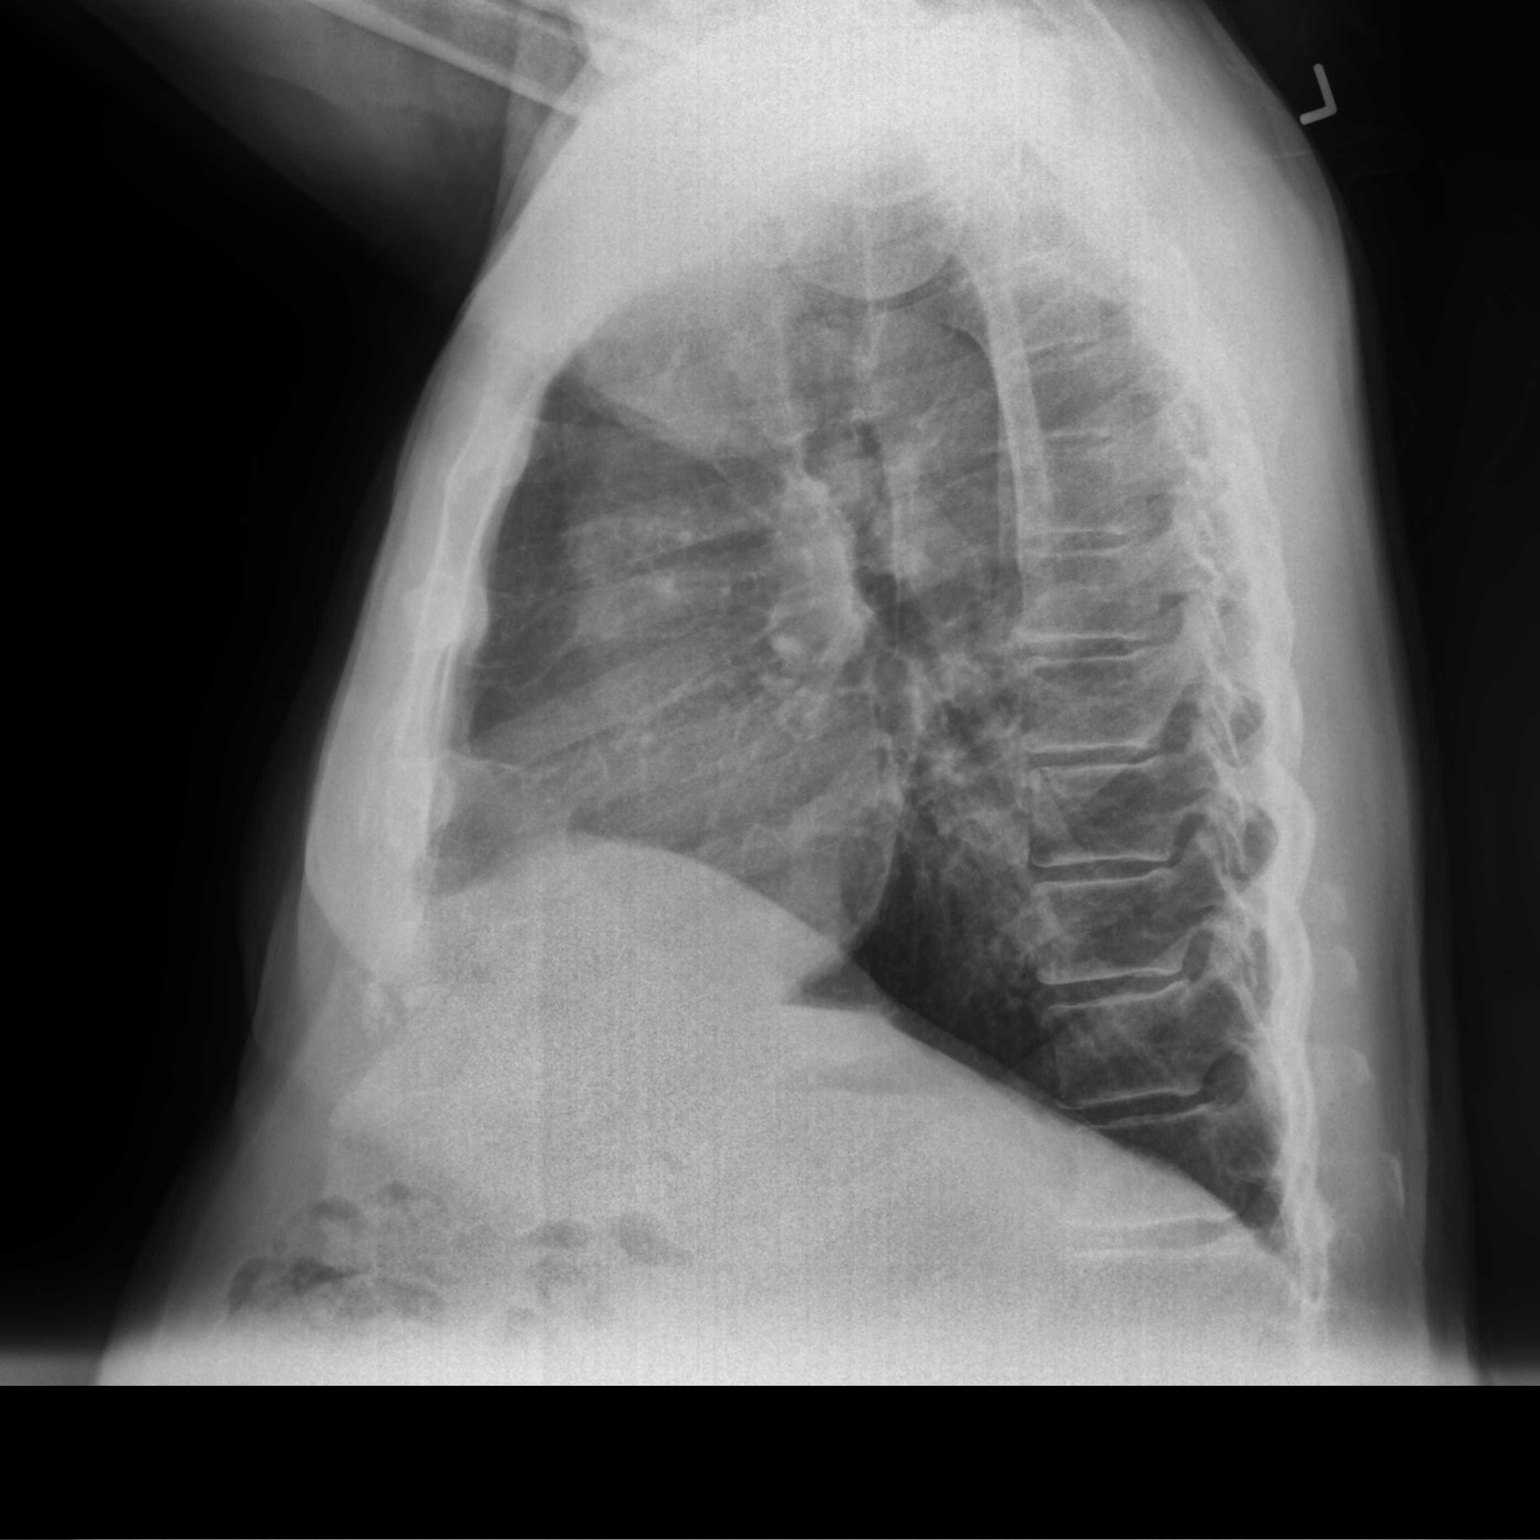

[2 of 2 positions shown; findings below may reference images not displayed]

FINDINGS: Lung volumes are normal. No consolidative airspace disease. No
pleural effusions. No pneumothorax. No pulmonary nodule or mass
noted. Pulmonary vasculature and the cardiomediastinal silhouette
are within normal limits. Atherosclerosis in the thoracic aorta.
IMPRESSION: 1.  No radiographic evidence of acute cardiopulmonary disease.
2. Aortic atherosclerosis.

## 2022-06-21 DIAGNOSIS — E113311 Type 2 diabetes mellitus with moderate nonproliferative diabetic retinopathy with macular edema, right eye: Secondary | ICD-10-CM | POA: Diagnosis not present

## 2022-07-07 ENCOUNTER — Inpatient Hospital Stay: Payer: Medicare Other | Attending: Hematology & Oncology

## 2022-07-07 ENCOUNTER — Inpatient Hospital Stay (HOSPITAL_BASED_OUTPATIENT_CLINIC_OR_DEPARTMENT_OTHER): Payer: Medicare Other | Admitting: Hematology & Oncology

## 2022-07-07 ENCOUNTER — Encounter: Payer: Self-pay | Admitting: *Deleted

## 2022-07-07 ENCOUNTER — Encounter: Payer: Self-pay | Admitting: Hematology & Oncology

## 2022-07-07 ENCOUNTER — Inpatient Hospital Stay: Payer: Medicare Other

## 2022-07-07 VITALS — BP 169/69 | HR 64 | Temp 98.5°F | Resp 20 | Ht 66.0 in | Wt 165.8 lb

## 2022-07-07 DIAGNOSIS — I129 Hypertensive chronic kidney disease with stage 1 through stage 4 chronic kidney disease, or unspecified chronic kidney disease: Secondary | ICD-10-CM | POA: Insufficient documentation

## 2022-07-07 DIAGNOSIS — N189 Chronic kidney disease, unspecified: Secondary | ICD-10-CM | POA: Diagnosis not present

## 2022-07-07 DIAGNOSIS — D631 Anemia in chronic kidney disease: Secondary | ICD-10-CM | POA: Diagnosis not present

## 2022-07-07 DIAGNOSIS — D500 Iron deficiency anemia secondary to blood loss (chronic): Secondary | ICD-10-CM

## 2022-07-07 DIAGNOSIS — D509 Iron deficiency anemia, unspecified: Secondary | ICD-10-CM | POA: Insufficient documentation

## 2022-07-07 DIAGNOSIS — E1122 Type 2 diabetes mellitus with diabetic chronic kidney disease: Secondary | ICD-10-CM | POA: Diagnosis not present

## 2022-07-07 LAB — IRON AND IRON BINDING CAPACITY (CC-WL,HP ONLY)
Iron: 78 ug/dL (ref 45–182)
Saturation Ratios: 26 % (ref 17.9–39.5)
TIBC: 300 ug/dL (ref 250–450)
UIBC: 222 ug/dL (ref 117–376)

## 2022-07-07 LAB — CMP (CANCER CENTER ONLY)
ALT: 9 U/L (ref 0–44)
AST: 16 U/L (ref 15–41)
Albumin: 4.3 g/dL (ref 3.5–5.0)
Alkaline Phosphatase: 55 U/L (ref 38–126)
Anion gap: 10 (ref 5–15)
BUN: 22 mg/dL (ref 8–23)
CO2: 25 mmol/L (ref 22–32)
Calcium: 9.5 mg/dL (ref 8.9–10.3)
Chloride: 103 mmol/L (ref 98–111)
Creatinine: 1.11 mg/dL (ref 0.61–1.24)
GFR, Estimated: >60 mL/min (ref >60)
Glucose, Bld: 125 mg/dL — ABNORMAL HIGH (ref 70–99)
Potassium: 4.6 mmol/L (ref 3.5–5.1)
Sodium: 138 mmol/L (ref 135–145)
Total Bilirubin: 0.3 mg/dL (ref 0.3–1.2)
Total Protein: 7.1 g/dL (ref 6.5–8.1)

## 2022-07-07 LAB — CBC WITH DIFFERENTIAL (CANCER CENTER ONLY)
Abs Immature Granulocytes: 0.11 10*3/uL — ABNORMAL HIGH (ref 0.00–0.07)
Basophils Absolute: 0.1 10*3/uL (ref 0.0–0.1)
Basophils Relative: 1 %
Eosinophils Absolute: 0.1 10*3/uL (ref 0.0–0.5)
Eosinophils Relative: 1 %
HCT: 36.3 % — ABNORMAL LOW (ref 39.0–52.0)
Hemoglobin: 11.5 g/dL — ABNORMAL LOW (ref 13.0–17.0)
Immature Granulocytes: 1 %
Lymphocytes Relative: 30 %
Lymphs Abs: 3.2 10*3/uL (ref 0.7–4.0)
MCH: 31.4 pg (ref 26.0–34.0)
MCHC: 31.7 g/dL (ref 30.0–36.0)
MCV: 99.2 fL (ref 80.0–100.0)
Monocytes Absolute: 0.9 10*3/uL (ref 0.1–1.0)
Monocytes Relative: 9 %
Neutro Abs: 6.2 10*3/uL (ref 1.7–7.7)
Neutrophils Relative %: 58 %
Platelet Count: 354 10*3/uL (ref 150–400)
RBC: 3.66 MIL/uL — ABNORMAL LOW (ref 4.22–5.81)
RDW: 12.9 % (ref 11.5–15.5)
WBC Count: 10.6 10*3/uL — ABNORMAL HIGH (ref 4.0–10.5)
nRBC: 0 % (ref 0.0–0.2)

## 2022-07-07 LAB — RETICULOCYTES
Immature Retic Fract: 10.9 % (ref 2.3–15.9)
RBC.: 3.7 MIL/uL — ABNORMAL LOW (ref 4.22–5.81)
Retic Count, Absolute: 48.1 10*3/uL (ref 19.0–186.0)
Retic Ct Pct: 1.3 % (ref 0.4–3.1)

## 2022-07-07 LAB — FERRITIN: Ferritin: 165 ng/mL (ref 24–336)

## 2022-07-07 NOTE — Progress Notes (Signed)
Hematology and Oncology Follow Up Visit  Dustin Mccarthy [id number] 31-Oct-1940 82 y.o. 07/07/2022   Principle Diagnosis:  Iron deficiency Anemia Erythropoietin def Anemia  Current Therapy:   IV iron - Injectafer - given on 02/08/2022  Aranesp 300 mcg sq for Hgb <11 -- dose given 07/13/2021     Interim History:  Dustin Mccarthy is back for a follow-up.  We saw back in November.  I know that this was a difficult Holiday season for him since his wife is no longer with him.  She passed away I think back in 10/10/22.  Thankfully, he does have a lot of family support.  He was down in [place] for the funeral of a family member.  When we last saw him back in November, his ferritin was 271 with an iron saturation of 27%.  He is doing pretty well.  His blood sugars are doing okay.  He has had no change in bowel or bladder habits.  He has had no cough or shortness of breath.  Thankfully, he has avoided COVID.  He has had no issues with bleeding.  There is been no rashes.  He had little bit of leg swelling.  Overall, I would say that his performance status is probably ECOG 1.     Medications:  Current Outpatient Medications:    ACCU-CHEK AVIVA PLUS test strip, 1 strip by Other route 2 (two) times daily. Use 1 strip to check glucose twice a day, Disp: , Rfl: 0   acyclovir (ZOVIRAX) 400 MG tablet, Take 400 mg by mouth 2 (two) times daily., Disp: , Rfl:    APPLE CIDER VINEGAR PO, Take by mouth. 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 1000 mg daily, Disp: , Rfl:    vitamin B-12 (CYANOCOBALAMIN) 1000 MCG tablet, Take 1,000 mcg by mouth. Every other day, Disp: , Rfl:    Zinc 50 MG TABS, Take 1 tablet by mouth daily at 6 (six) AM., Disp: , Rfl:    Cholecalciferol 50 MCG (2000 UT) TABS, Take 1 tablet by mouth daily., Disp: , Rfl:   Allergies:  Allergies  Allergen Reactions   Bee Venom Anaphylaxis   Hydrochlorothiazide Other (See Comments)    Dizzy  Oxycodone Nausea And Vomiting   Iodinated Contrast Media Rash   Iodine Other (See Comments)    UNKNOWN REACTION   Povidone Iodine Other (See Comments)    UNKNOWN REACTION    Past Medical History, Surgical history, Social history, and Family History were reviewed and updated.  Review of Systems: Review of Systems  Constitutional:  Positive for fatigue.  HENT:  Negative.    Eyes: Negative.   Respiratory: Negative.    Cardiovascular: Negative.   Gastrointestinal:  Positive for constipation.  Endocrine: Negative.   Genitourinary: Negative.    Musculoskeletal:  Positive for arthralgias and myalgias.  Skin: Negative.   Neurological:  Positive for light-headedness.  Hematological: Negative.   Psychiatric/Behavioral: Negative.      Physical Exam:  height is 5' 6"$  (1.676 m) and weight is 165 lb 12.8 oz (75.2 kg). His oral temperature is 98.5 F (36.9 C). His blood pressure is 169/69 (abnormal) and his pulse is 64. His respiration is  20 and oxygen saturation is 98%.   Wt Readings from Last 3 Encounters:  07/07/22 165 lb 12.8 oz (75.2 kg)  04/06/22 163 lb 6.4 oz (74.1 kg)  02/01/22 162 lb (73.5 kg)    Physical Exam Vitals reviewed.  HENT:     Head: Normocephalic and atraumatic.  Eyes:     Pupils: Pupils are equal, round, and reactive to light.  Cardiovascular:     Rate and Rhythm: Normal rate and regular rhythm.     Heart sounds: Normal heart sounds.  Pulmonary:     Effort: Pulmonary effort is normal.     Breath sounds: Normal breath sounds.  Abdominal:     General: Bowel sounds are normal.     Palpations: Abdomen is soft.  Musculoskeletal:        General: No tenderness or deformity. Normal range of motion.     Cervical back: Normal range of motion.  Lymphadenopathy:     Cervical: No cervical adenopathy.  Skin:    General: Skin is warm and dry.     Findings: No erythema or rash.  Neurological:     Mental Status: He is alert and oriented to person, place, and time.  Psychiatric:        Behavior: Behavior normal.        Thought Content: Thought content normal.        Judgment: Judgment normal.      Lab Results  Component Value Date   WBC 10.6 (H) 07/07/2022   HGB 11.5 (L) 07/07/2022   HCT 36.3 (L) 07/07/2022   MCV 99.2 07/07/2022   PLT 354 07/07/2022     Chemistry      Component Value Date/Time   NA 138 07/07/2022 1140   NA 134 (L) 03/31/2014 1432   K 4.6 07/07/2022 1140   K 4.6 03/31/2014 1432   CL 103 07/07/2022 1140   CO2 25 07/07/2022 1140   CO2 23 03/31/2014 1432   BUN 22 07/07/2022 1140   BUN 17.2 03/31/2014 1432   CREATININE 1.11 07/07/2022 1140   CREATININE 1.0 03/31/2014 1432      Component Value Date/Time   CALCIUM 9.5 07/07/2022 1140   CALCIUM 9.5 03/31/2014 1432   ALKPHOS 55 07/07/2022 1140   ALKPHOS 63 02/16/2014 1140   AST 16 07/07/2022 1140   AST 15 02/16/2014 1140   ALT 9 07/07/2022 1140   ALT 12 02/16/2014 1140   BILITOT 0.3 07/07/2022 1140   BILITOT 0.38  02/16/2014 1140  Impression and Plan: Dustin Mccarthy is a 82 year old white male.  He has diabetes.  He has mild anemia.  He responded incredibly well to the Aranesp.  He responded well to iron.  He does not need any injections today.  We will plan to get him back in 3 months.  I am just happy that he has done so well with respect to the iron and to the ESA injections.  I still feel bad about his poor wife.  I know that he misses her.  Thankfully, he does have good support from his family.   Volanda Napoleon, MD 2/9/202412:59 PM

## 2022-07-26 DIAGNOSIS — E113311 Type 2 diabetes mellitus with moderate nonproliferative diabetic retinopathy with macular edema, right eye: Secondary | ICD-10-CM | POA: Diagnosis not present

## 2022-08-02 DIAGNOSIS — I10 Essential (primary) hypertension: Secondary | ICD-10-CM | POA: Diagnosis not present

## 2022-08-09 DIAGNOSIS — R0602 Shortness of breath: Secondary | ICD-10-CM | POA: Diagnosis not present

## 2022-08-09 DIAGNOSIS — D500 Iron deficiency anemia secondary to blood loss (chronic): Secondary | ICD-10-CM | POA: Diagnosis not present

## 2022-08-09 DIAGNOSIS — I6529 Occlusion and stenosis of unspecified carotid artery: Secondary | ICD-10-CM | POA: Diagnosis not present

## 2022-08-09 DIAGNOSIS — R490 Dysphonia: Secondary | ICD-10-CM | POA: Diagnosis not present

## 2022-08-09 DIAGNOSIS — I2510 Atherosclerotic heart disease of native coronary artery without angina pectoris: Secondary | ICD-10-CM | POA: Diagnosis not present

## 2022-08-09 DIAGNOSIS — E7800 Pure hypercholesterolemia, unspecified: Secondary | ICD-10-CM | POA: Diagnosis not present

## 2022-08-09 DIAGNOSIS — E222 Syndrome of inappropriate secretion of antidiuretic hormone: Secondary | ICD-10-CM | POA: Diagnosis not present

## 2022-08-09 DIAGNOSIS — K219 Gastro-esophageal reflux disease without esophagitis: Secondary | ICD-10-CM | POA: Diagnosis not present

## 2022-08-09 DIAGNOSIS — Z0000 Encounter for general adult medical examination without abnormal findings: Secondary | ICD-10-CM | POA: Diagnosis not present

## 2022-08-09 DIAGNOSIS — E1139 Type 2 diabetes mellitus with other diabetic ophthalmic complication: Secondary | ICD-10-CM | POA: Diagnosis not present

## 2022-08-09 DIAGNOSIS — Z23 Encounter for immunization: Secondary | ICD-10-CM | POA: Diagnosis not present

## 2022-08-09 DIAGNOSIS — I10 Essential (primary) hypertension: Secondary | ICD-10-CM | POA: Diagnosis not present

## 2022-08-14 ENCOUNTER — Telehealth: Payer: Self-pay | Admitting: Internal Medicine

## 2022-08-14 DIAGNOSIS — R0600 Dyspnea, unspecified: Secondary | ICD-10-CM

## 2022-08-14 DIAGNOSIS — I5189 Other ill-defined heart diseases: Secondary | ICD-10-CM

## 2022-08-14 NOTE — Telephone Encounter (Signed)
Received a referral from PCP requesting overdue follow up with Dr. Debara Pickett with an echo one week prior per last note. Previous order placed by Dr. Debara Pickett for the Echo was cancelled, requesting new order be placed to schedule with f/u. Please advise.

## 2022-08-14 NOTE — Telephone Encounter (Signed)
New order placed

## 2022-09-06 DIAGNOSIS — E113311 Type 2 diabetes mellitus with moderate nonproliferative diabetic retinopathy with macular edema, right eye: Secondary | ICD-10-CM | POA: Diagnosis not present

## 2022-09-08 ENCOUNTER — Ambulatory Visit (HOSPITAL_COMMUNITY): Payer: Medicare Other | Attending: Internal Medicine

## 2022-09-08 DIAGNOSIS — R0600 Dyspnea, unspecified: Secondary | ICD-10-CM

## 2022-09-08 DIAGNOSIS — I5189 Other ill-defined heart diseases: Secondary | ICD-10-CM | POA: Insufficient documentation

## 2022-09-08 LAB — ECHOCARDIOGRAM COMPLETE
Area-P 1/2: 2.11 cm2
S' Lateral: 3.7 cm

## 2022-09-13 DIAGNOSIS — R490 Dysphonia: Secondary | ICD-10-CM | POA: Diagnosis not present

## 2022-09-13 DIAGNOSIS — J3489 Other specified disorders of nose and nasal sinuses: Secondary | ICD-10-CM | POA: Diagnosis not present

## 2022-09-14 NOTE — Progress Notes (Signed)
Cardiology Clinic Note   Date: 09/15/2022 ID: Dustin Mccarthy, DOB [birth year], MRN [id number]  Primary Cardiologist:  Chrystie Nose, MD  Patient Profile    Dustin Mccarthy is a 82 y.o. male who presents to the clinic today for routine follow-up.   Past medical history significant for: Nonobstructive CAD. LHC 04/07/2009 (positive stress test): LM 10%.  Ostial ramus intermedius 20%.  Diffuse LCx 10%.  Mid RCA 20%.  Mildly elevated LVEDP. LHC 02/20/2020 (performed at outside facility): Near normal coronary arteries.  No significant obstructive disease seen.  Medical therapy recommended. Echo 09/08/2022: EF 55 to 60%.  Grade I DD.  Trivial MR.  Aortic valve sclerosis/calcification without stenosis. Carotid artery disease. Carotid ultrasound 01/30/2014: Mild calcified plaque proximal portions of bilateral ICA <50%. Hypertension. Hyperlipidemia. SIADH. Insulin-dependent T2DM. Iron deficiency anemia.   History of Present Illness    Dustin Mccarthy is followed by Dr. Rennis Golden for the above outlined history.  Patient was last seen in the office by Dr. Rennis Golden on 04/28/2021.  At that time he reported continued DOE somewhat improved with increased exercise.  No medication changes were made.  Plan for repeat echo in 6 months which did was not done until April 2024.  He has not been seen since.  More recently, PCP Dr. Renne Crigler requested scheduling for overdue follow-up and order for echo that had previously been canceled.  Echo showed normal LV function (detailed above).  Today, patient is doing well. Patient denies shortness of breath at rest. DOE with heavy exertion but not with normal activities. This is stable for years.  No chest pain or pressure. Occasional brief right sided chest tightness that occurs while laying down and resolves in minutes on its own. Denies orthopnea or PND. Mild bilateral ankle edema that he first noticed months ago and is stable. He does not weigh at home but weight at doctor's visits  are stable. No palpitations. He stays busy doing activities around the house. His wife passed away and he has been doing some spring cleaning and tidying. He moved furniture around and cleaned up in his sun room yesterday with no shortness of breath or chest pain. He stopped Crestor on his own in the middle of 2023 and PCP started him on Zetia. His cholesterol has not been checked in a couple of years.    ROS: All other systems reviewed and are otherwise negative except as noted in History of Present Illness.  Studies Reviewed    ECG personally reviewed by me today: Sinus bradycardia, 1st degree AV block, RBBB rate 58 bpm.  No significant changes from 04/28/2021.           Physical Exam    VS:  BP 132/86   Pulse (!) 58   Ht  (1.676 m)   Wt 164 lb 12.8 oz (74.8 kg)   SpO2 97%   BMI 26.60 kg/m  , BMI Body mass index is 26.6 kg/m.  GEN: Well nourished, well developed, in no acute distress. Neck: No JVD or carotid bruits. Cardiac:  RRR. No murmurs. No rubs or gallops.   Respiratory:  Respirations regular and unlabored. Clear to auscultation without rales, wheezing or rhonchi. GI: Soft, nontender, nondistended. Extremities: Radials/DP/PT 2+ and equal bilaterally. No clubbing or cyanosis. Trace edema bilateral lower extremities.  Skin: Warm and dry, no rash. Neuro: Strength intact.  Assessment & Plan   Nonobstructive CAD.  LHC September 2021 performed at an outside facility showed near normal coronary arteries without significant obstructive disease.  Patient denies chest pain or pressure. He reports occasional, brief, mild right sided chest tightness that occurs while lying in bed and resolves on its own over a few minutes. He recently rearranged furniture with no chest pain or shortness of breath. When he underwent heart catheterizations in the past it was secondary to increased fatigue and activity intolerance. He has not experienced that since his last LHC in 2021. Continue  aspirin, Zetia. Carotid artery disease.  Carotid ultrasound September 2015 showed mild calcified plaque proximal portions of bilateral ICA <50%.  Patient denies lightheadedness, presyncope, syncope.  Continue aspirin and Zetia. Hypertension.  BP today 132/86. BP at home typically runs in the 120-140/60-80. Patient denies headaches or dizziness.  Continue olmesartan. Hyperlipidemia. Patient stopped Crestor on his own mid 2023. PCP started him on Zetia around the same time. He has not had a lipid panel in a couple of years. Will get a lipid panel and direct LDL today.   Disposition: Lipid panel and direct LDL. Return in 1 year or sooner as needed.          Signed, Etta Grandchild. Anneka Studer, DNP, NP-C

## 2022-09-15 ENCOUNTER — Ambulatory Visit: Payer: Medicare Other | Attending: Student | Admitting: Student

## 2022-09-15 ENCOUNTER — Encounter: Payer: Self-pay | Admitting: Student

## 2022-09-15 VITALS — BP 132/86 | HR 58 | Ht 66.0 in | Wt 164.8 lb

## 2022-09-15 DIAGNOSIS — I2510 Atherosclerotic heart disease of native coronary artery without angina pectoris: Secondary | ICD-10-CM | POA: Diagnosis not present

## 2022-09-15 DIAGNOSIS — I10 Essential (primary) hypertension: Secondary | ICD-10-CM | POA: Insufficient documentation

## 2022-09-15 DIAGNOSIS — E785 Hyperlipidemia, unspecified: Secondary | ICD-10-CM | POA: Insufficient documentation

## 2022-09-15 DIAGNOSIS — I2583 Coronary atherosclerosis due to lipid rich plaque: Secondary | ICD-10-CM | POA: Insufficient documentation

## 2022-09-15 NOTE — Patient Instructions (Signed)
Medication Instructions:  Your physician recommends that you continue on your current medications as directed. Please refer to the Current Medication list given to you today.  *If you need a refill on your cardiac medications before your next appointment, please call your pharmacy*   Lab Work: Your physician recommends that you have the following labs drawn today: Lipid panel and Direct LDL  If you have labs (blood work) drawn today and your tests are completely normal, you will receive your results only by: MyChart Message (if you have MyChart) OR A paper copy in the mail If you have any lab test that is abnormal or we need to change your treatment, we will call you to review the results.   Testing/Procedures: NONE   Follow-Up: At [facility], you and your health needs are our priority.  As part of our continuing mission to provide you with exceptional heart care, we have created designated Provider Care Teams.  These Care Teams include your primary Cardiologist (physician) and Advanced Practice Providers (APPs -  Physician Assistants and Nurse Practitioners) who all work together to provide you with the care you need, when you need it.  We recommend signing up for the patient portal called "MyChart".  Sign up information is provided on this After Visit Summary.  MyChart is used to connect with patients for Virtual Visits (Telemedicine).  Patients are able to view lab/test results, encounter notes, upcoming appointments, etc.  Non-urgent messages can be sent to your provider as well.   To learn more about what you can do with MyChart, go to [email or web address].    Your next appointment:   1 year(s)  Provider:   Chrystie Nose, MD

## 2022-09-16 LAB — LIPID PANEL
Chol/HDL Ratio: 3.7 ratio (ref 0.0–5.0)
Cholesterol, Total: 146 mg/dL (ref 100–199)
HDL: 40 mg/dL (ref >39)
LDL Chol Calc (NIH): 87 mg/dL (ref 0–99)
Triglycerides: 100 mg/dL (ref 0–149)
VLDL Cholesterol Cal: 19 mg/dL (ref 5–40)

## 2022-09-16 LAB — LDL CHOLESTEROL, DIRECT: LDL Direct: 82 mg/dL (ref 0–99)

## 2022-09-19 DIAGNOSIS — R600 Localized edema: Secondary | ICD-10-CM | POA: Diagnosis not present

## 2022-09-19 DIAGNOSIS — I10 Essential (primary) hypertension: Secondary | ICD-10-CM | POA: Diagnosis not present

## 2022-09-20 NOTE — Addendum Note (Signed)
Addended by: Missy Sabins on: 09/20/2022 10:21 AM   Modules accepted: Orders

## 2022-09-21 NOTE — Therapy (Signed)
OUTPATIENT SPEECH LANGUAGE PATHOLOGY VOICE EVALUATION   Patient Name: Dustin Mccarthy MRN: [id number] DOB:Oct 01, 1940, 82 y.o., male Today's Date: 09/22/2022  PCP: Merri Brunette, MD REFERRING PROVIDER: Merri Brunette, MD  END OF SESSION:  End of Session - 09/22/22 1356     Visit Number 1    Number of Visits 5    Date for SLP Re-Evaluation 10/20/22    Authorization Type Medicare    SLP Start Time 1355    SLP Stop Time  1437    SLP Time Calculation (min) 42 min    Activity Tolerance Patient tolerated treatment well             Past Medical History:  Diagnosis Date   Arthritis    CAD (coronary artery disease)    mild (cath 2010)   Constipation 02/24/2014   DOE (dyspnea on exertion)    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   GERD (gastroesophageal reflux disease)    Herpes simplex conjunctivitis    Hyperlipidemia    Hypertension    Hyponatremia 02/24/2014   Iron deficiency anemia due to chronic blood loss 07/07/2019   Obesity    Right shoulder pain 02/24/2014   Type 2 diabetes mellitus (HCC)    Unspecified deficiency anemia 02/16/2014   Weight loss 02/16/2014   Past Surgical History:  Procedure Laterality Date   BACK SURGERY  1988   CARDIAC CATHETERIZATION  2010   after abnormal stress test (03/2009) - mild coronary disease   Cardiometabolic Testing  02/2012   RER of 1.06, peak VO2 84% predicted; HR 87% predicted   HERNIA REPAIR     Skull Fracture Surgery  1945   TRANSTHORACIC ECHOCARDIOGRAM  03/2009   EF 45-50%, mild conc LVH, mod septal hypokinesis, mod apical wall hypokinesis; RV systolic function borderline reduced; trace MR/TR   Patient Active Problem List   Diagnosis Date Noted   Abnormal gait 02/11/2021   Other insect allergy status 02/11/2021   Arteriosclerosis of carotid artery 02/11/2021   Aspirin long-term use 02/11/2021   Benign prostatic hyperplasia 02/11/2021   Body mass index (BMI) 27.0-27.9, adult 02/11/2021   Chronic fatigue syndrome  02/11/2021   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) 02/11/2021   Contracture of palmar fascia 02/11/2021   Dyspnea on exertion 02/11/2021   Edema 02/11/2021   Encounter for fitting and adjustment of hearing aid 02/11/2021   Encounter for general adult medical examination with abnormal findings 02/11/2021   Family history of Parkinson disease 02/11/2021   Esophageal reflux 02/11/2021   Hearing loss 02/11/2021   History of squamous cell carcinoma of skin 02/11/2021   Hyperlipidemia 02/11/2021   Long term (current) use of insulin (HCC) 02/11/2021   Physical deconditioning 02/11/2021   Pure hypercholesterolemia 02/11/2021   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   Tremor 02/11/2021   Trochanteric bursitis of left hip 02/11/2021   Iron deficiency anemia due to chronic blood loss 07/07/2019   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   Dysphasia 06/13/2016   Globus pharyngeus 06/13/2016   Hoarseness 06/13/2016   Effusion of right shoulder 05/18/2014   Right shoulder pain 02/24/2014   Constipation 02/24/2014   Hyponatremia 02/24/2014   Reactive thrombocytosis 02/24/2014   Deficiency anemia 02/16/2014   Weight loss 02/16/2014   Leukocytosis 02/16/2014   Essential hypertension 04/01/2013   Insulin dependent diabetes mellitus without complication 04/01/2013   Dyslipidemia 04/01/2013   Coronary artery disease due to lipid rich plaque 04/01/2013    Onset date: 09/19/22 referral  REFERRING DIAG: R49.0 (ICD-10-CM) - Dysphonia   THERAPY DIAG:  Dysphonia - Plan: SLP plan of care cert/re-cert  Rationale for Evaluation and Treatment: Rehabilitation  SUBJECTIVE:   SUBJECTIVE STATEMENT: Reports voice changes in loudness and quality over 3 month period. Pt accompanied by: self  PERTINENT HISTORY: Pt c/o dysphonia, seen by ENT with results below.   PAIN:  Are you having pain? No  FALLS: Has patient fallen in last 6 months?  No, Number of falls: 0  LIVING ENVIRONMENT: Lives with: lives alone Lives in: House/apartment  PLOF: Independent  PATIENT GOALS: make sure nothing was causing voice changes  OBJECTIVE:   DIAGNOSTIC FINDINGS: 09/13/22 "Flexible laryngoscopy shows patent anterior nasal cavity with minimal crusting, no discharge or infection. Mild arytenoid edema and erythema noted, otherwise normal base of tongue and supraglottis. Normal vocal cord mobility without vocal cord nodule, mass, polyp or tumor. Compression of the false cords with attempted phonation, consistent with muscle tension dysphonia. Hypopharynx normal without mass, pooling of secretions or aspiration."  COGNITION: Overall cognitive status: Within functional limits for tasks assessed  SOCIAL HISTORY: Occupation: retired Counsellor intake: suboptimal Caffeine/alcohol intake: minimal Daily voice use: minimal  PERCEPTUAL VOICE ASSESSMENT: Voice quality: normal, rough, strained, and low vocal intensity Vocal abuse: habitual throat clearing Resonance: normal Respiratory function: speaking on residual capacity  OBJECTIVE VOICE ASSESSMENT: Maximum phonation time for sustained "ah": 10 seconds  Conversational pitch average: 147 Hz Conversational pitch range: 126-191 Hz Conversational loudness average: 68 dB Conversational loudness range: 66-78 dB S/z ratio: 1.2 (Suggestive of dysfunction >1.0)  PATIENT REPORTED OUTCOME MEASURES (PROM): V-RQOL: 13 ; calculated score 32.5 (fair-good)  TODAY'S TREATMENT:                                                                                                                                         09/22/22: instruct pt in initial HEP- semi occluded vocal tract exercises (SOVTE) using straw phonation. Pt able to achieve with sustained phonation, unable to complete with glides, siren, or song. Instruct on vocal function exercises (VFE) , with pt able to achieve clear voicing with "ol" during sustained  phonation and glides. Trial resonant voice, without successful clear voicing achieved at vowel or word level. Education provided, with handout, on throat clear alternatives. Reflux management recommendations provided, though pt denies overt s/sx of reflux. Education provided on "silent" reflux.   PATIENT EDUCATION: Education details: see above Person educated: Patient Education method: Solicitor, Verbal cues, and Handouts Education comprehension: verbalized understanding, returned demonstration, and needs further education  HOME EXERCISE PROGRAM: SOTVE, VFE  GOALS: Goals reviewed with patient? Yes  SHORT TERM GOALS = LONG TERM GOALS D/T LENGTH OF POC: Target date: 10/20/2022  Pt will correctly complete voice exercises (SOVTE, VFE) with occasional min-A over 2 sessions  Baseline: Goal status: INITIAL  2.  Pt will demonstrate improved vocal quality at sentence level 18/20 trials with occasional  mod-A Baseline:  Goal status: INITIAL  3.  Pt will report subjective perception of improved vocal quality during phone call with son  Baseline:  Goal status: INITIAL  4.  Pt will report completion of HEP 5/7 days over 1 week period Baseline:  Goal status: INITIAL   ASSESSMENT:  CLINICAL IMPRESSION: Patient is a 82 y.o. M who was seen today for voice evaluation. Pt presents with mild dysphonia c/b hoarseness and decreased volume. Pt reporting dysphonia ongoing for 3 months. Tells SLP he is not bothered by voice, just wanted to make sure nothing was causing. 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 Edema noted on exam which may indicate reflux, though pt denies overt s/sx of GERD. Pt reporting has implemented dietary changes recommended by Dr. Marene Lenz. Pt able to achieve clear voicing during vocal function exercises and demonstrate straw phonation at sustained phonation level. Initiated HEP. Recommend ST to address dysphonia.   OBJECTIVE IMPAIRMENTS:  include voice disorder. These impairments are limiting patient from effectively communicating at home and in community. Factors affecting potential to achieve goals and functional outcome are cooperation/participation level. Patient will benefit from skilled SLP services to address above impairments and improve overall function.  REHAB POTENTIAL: Good  PLAN:  SLP FREQUENCY: 1-2x/week  SLP DURATION: 4 weeks  PLANNED INTERVENTIONS: Cueing hierachy, Internal/external aids, Functional tasks, SLP instruction and feedback, Compensatory strategies, and Patient/family education    Maia Breslow, CCC-SLP 09/22/2022, 3:08 PM

## 2022-09-22 ENCOUNTER — Ambulatory Visit: Payer: Medicare Other | Attending: Internal Medicine | Admitting: Speech Pathology

## 2022-09-22 DIAGNOSIS — R490 Dysphonia: Secondary | ICD-10-CM

## 2022-09-22 NOTE — Patient Instructions (Signed)
  Semi-occluded vocal tract exercises (SOVTE)  These allow your vocal folds to vibrate without excess tension and promotes high placement of the voice  Use SOVTE as a warm up before prolonged speaking and vocal exercises   High resistance: voicing through a stirring straw  Medium resistance: voicing through a drinking straw  Less resistance: Voiced /v/                            Lip or Tongue Trill                            Nasal "hums" /m/ and /n/                            Vowels /u/ and ee  Watch Vocal Straw Exercises with Lenox Ahr on YouTube:  [email or web address]  Sustained humming  Pitch Glides for 2 minutes  Accents (siren)  Hum the [employer]  A goal would be 2-3 minutes several times a day and prior to vocal exercises  As always, use good belly breathing while completing SOVTE  Vocal Function Exercises  WARM UP Hold out ooll as long and you can with good quality Remember: LOUD and STRONG Feel the sound in your face, not your throat If you have excess tension in your throat, try pushing hands together at your chest  STRETCH Glide from a low note to a high note Use ooll Maintain your good quality sound that we practiced in therapy CONTRACT  Glide from a high note to a low note Use ooll Maintain your good quality sound that we practiced in therapy SUSTAINED PHONATION Use ooll Hold out as long as possible at low note Hold out as long as possible at mid note Hold out as long as possible at high note  Complete each part 2x, twice a day   Remember these are exercises! They are supposed to be challenging!!   When you're speaking- best voice with least amount effort

## 2022-09-28 DIAGNOSIS — Z85828 Personal history of other malignant neoplasm of skin: Secondary | ICD-10-CM | POA: Diagnosis not present

## 2022-09-28 DIAGNOSIS — D1801 Hemangioma of skin and subcutaneous tissue: Secondary | ICD-10-CM | POA: Diagnosis not present

## 2022-09-28 DIAGNOSIS — Z85820 Personal history of malignant melanoma of skin: Secondary | ICD-10-CM | POA: Diagnosis not present

## 2022-09-28 DIAGNOSIS — L812 Freckles: Secondary | ICD-10-CM | POA: Diagnosis not present

## 2022-09-28 DIAGNOSIS — L565 Disseminated superficial actinic porokeratosis (DSAP): Secondary | ICD-10-CM | POA: Diagnosis not present

## 2022-09-28 DIAGNOSIS — L821 Other seborrheic keratosis: Secondary | ICD-10-CM | POA: Diagnosis not present

## 2022-09-28 DIAGNOSIS — L570 Actinic keratosis: Secondary | ICD-10-CM | POA: Diagnosis not present

## 2022-09-29 ENCOUNTER — Inpatient Hospital Stay: Payer: Medicare Other | Attending: Hematology & Oncology

## 2022-09-29 ENCOUNTER — Inpatient Hospital Stay (HOSPITAL_BASED_OUTPATIENT_CLINIC_OR_DEPARTMENT_OTHER): Payer: Medicare Other | Admitting: Hematology & Oncology

## 2022-09-29 ENCOUNTER — Encounter: Payer: Self-pay | Admitting: Hematology & Oncology

## 2022-09-29 ENCOUNTER — Ambulatory Visit (HOSPITAL_BASED_OUTPATIENT_CLINIC_OR_DEPARTMENT_OTHER)
Admission: RE | Admit: 2022-09-29 | Discharge: 2022-09-29 | Disposition: A | Discharge status: Home / Self Care | Payer: Medicare Other | Source: Ambulatory Visit | Attending: Hematology & Oncology | Admitting: Hematology & Oncology

## 2022-09-29 VITALS — BP 138/70 | HR 60 | Temp 98.2°F | Resp 20 | Ht 66.0 in | Wt 161.0 lb

## 2022-09-29 DIAGNOSIS — R490 Dysphonia: Secondary | ICD-10-CM | POA: Insufficient documentation

## 2022-09-29 DIAGNOSIS — D631 Anemia in chronic kidney disease: Secondary | ICD-10-CM

## 2022-09-29 DIAGNOSIS — D500 Iron deficiency anemia secondary to blood loss (chronic): Secondary | ICD-10-CM

## 2022-09-29 DIAGNOSIS — D509 Iron deficiency anemia, unspecified: Secondary | ICD-10-CM | POA: Diagnosis not present

## 2022-09-29 DIAGNOSIS — E1122 Type 2 diabetes mellitus with diabetic chronic kidney disease: Secondary | ICD-10-CM | POA: Diagnosis not present

## 2022-09-29 DIAGNOSIS — R059 Cough, unspecified: Secondary | ICD-10-CM | POA: Diagnosis not present

## 2022-09-29 DIAGNOSIS — N189 Chronic kidney disease, unspecified: Secondary | ICD-10-CM | POA: Diagnosis not present

## 2022-09-29 LAB — CMP (CANCER CENTER ONLY)
ALT: 9 U/L (ref 0–44)
AST: 15 U/L (ref 15–41)
Albumin: 4.1 g/dL (ref 3.5–5.0)
Alkaline Phosphatase: 60 U/L (ref 38–126)
Anion gap: 8 (ref 5–15)
BUN: 20 mg/dL (ref 8–23)
CO2: 24 mmol/L (ref 22–32)
Calcium: 9.2 mg/dL (ref 8.9–10.3)
Chloride: 100 mmol/L (ref 98–111)
Creatinine: 1.19 mg/dL (ref 0.61–1.24)
GFR, Estimated: >60 mL/min (ref >60)
Glucose, Bld: 112 mg/dL — ABNORMAL HIGH (ref 70–99)
Potassium: 4.6 mmol/L (ref 3.5–5.1)
Sodium: 132 mmol/L — ABNORMAL LOW (ref 135–145)
Total Bilirubin: 0.3 mg/dL (ref 0.3–1.2)
Total Protein: 7.2 g/dL (ref 6.5–8.1)

## 2022-09-29 LAB — CBC WITH DIFFERENTIAL (CANCER CENTER ONLY)
Abs Immature Granulocytes: 0.04 10*3/uL (ref 0.00–0.07)
Basophils Absolute: 0.1 10*3/uL (ref 0.0–0.1)
Basophils Relative: 1 %
Eosinophils Absolute: 0.1 10*3/uL (ref 0.0–0.5)
Eosinophils Relative: 1 %
HCT: 35.6 % — ABNORMAL LOW (ref 39.0–52.0)
Hemoglobin: 11.8 g/dL — ABNORMAL LOW (ref 13.0–17.0)
Immature Granulocytes: 0 %
Lymphocytes Relative: 26 %
Lymphs Abs: 2.6 10*3/uL (ref 0.7–4.0)
MCH: 31.1 pg (ref 26.0–34.0)
MCHC: 33.1 g/dL (ref 30.0–36.0)
MCV: 93.9 fL (ref 80.0–100.0)
Monocytes Absolute: 1 10*3/uL (ref 0.1–1.0)
Monocytes Relative: 10 %
Neutro Abs: 6.3 10*3/uL (ref 1.7–7.7)
Neutrophils Relative %: 62 %
Platelet Count: 374 10*3/uL (ref 150–400)
RBC: 3.79 MIL/uL — ABNORMAL LOW (ref 4.22–5.81)
RDW: 12.7 % (ref 11.5–15.5)
WBC Count: 10 10*3/uL (ref 4.0–10.5)
nRBC: 0 % (ref 0.0–0.2)

## 2022-09-29 LAB — FERRITIN: Ferritin: 167 ng/mL (ref 24–336)

## 2022-09-29 LAB — RETICULOCYTES
Immature Retic Fract: 11.5 % (ref 2.3–15.9)
RBC.: 3.81 MIL/uL — ABNORMAL LOW (ref 4.22–5.81)
Retic Count, Absolute: 45 10*3/uL (ref 19.0–186.0)
Retic Ct Pct: 1.2 % (ref 0.4–3.1)

## 2022-09-29 LAB — IRON AND IRON BINDING CAPACITY (CC-WL,HP ONLY)
Iron: 58 ug/dL (ref 45–182)
Saturation Ratios: 19 % (ref 17.9–39.5)
TIBC: 314 ug/dL (ref 250–450)
UIBC: 256 ug/dL (ref 117–376)

## 2022-09-29 NOTE — Progress Notes (Signed)
Hematology and Oncology Follow Up Visit  Dustin Mccarthy [id number] 07/03/1940 82 y.o. 09/29/2022   Principle Diagnosis:  Iron deficiency Anemia Erythropoietin def Anemia  Current Therapy:   IV iron - Injectafer - given on 02/08/2022  Aranesp 300 mcg sq for Hgb <11 -- dose given 07/13/2021     Interim History:  Dustin Mccarthy is back for a follow-up.  Overall, he is doing okay.  He will be about a year that his wife passed away.  I know this might be a tough month for him.  We last saw him back in February.  At that time, he was doing okay.  He had iron studies which showed a ferritin was 65 with an iron saturation of 26%.  He has had no issues with bleeding or bruising.  He has had no change in bowel or bladder habits.  He has had no cough or shortness of breath.  He is still a little bit hoarse.  He has seen ENT.  I think they probably did a indirect laryngoscopy on him.  They did not see anything that was obvious.  I think he probably needs to have a chest x-ray done at least.  We will set 1 up for today.  He has had no problems with fever.  The been no issues with COVID.  Overall, I would say that his performance status is probably ECOG 1.    Medications:  Current Outpatient Medications:    ACCU-CHEK AVIVA PLUS test strip, 1 strip by Other route 2 (two) times daily. Use 1 strip to check glucose twice a day, Disp: , Rfl: 0   APPLE CIDER VINEGAR PO, Take by mouth. 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 1000 mg daily, Disp: , Rfl:    vitamin B-12 (CYANOCOBALAMIN) 1000 MCG tablet, Take 1,000 mcg by mouth. Every other day, Disp: , Rfl:    Zinc 50 MG TABS, Take 1 tablet by mouth daily at 6 (six) AM., Disp: , Rfl:   Allergies:  Allergies  Allergen Reactions   Bee Venom Anaphylaxis   Hydrochlorothiazide Other (See Comments)  Dizzy   Oxycodone Nausea And Vomiting   Iodinated Contrast Media Rash   Iodine Other (See Comments)    UNKNOWN REACTION   Povidone Iodine Other (See Comments)    UNKNOWN REACTION    Past Medical History, Surgical history, Social history, and Family History were reviewed and updated.  Review of Systems: Review of Systems  Constitutional:  Positive for fatigue.  HENT:  Negative.    Eyes: Negative.   Respiratory: Negative.    Cardiovascular: Negative.   Gastrointestinal:  Positive for constipation.  Endocrine: Negative.   Genitourinary: Negative.    Musculoskeletal:  Positive for arthralgias and myalgias.  Skin: Negative.   Neurological:  Positive for light-headedness.  Hematological: Negative.   Psychiatric/Behavioral: Negative.      Physical Exam:  height is 5\' 6"  (1.676 m) and weight is 161 lb (73 kg). His oral temperature is 98.2 F (36.8 C). His blood pressure is 138/70 and his pulse is 60. His respiration is 20 and oxygen saturation is 100%.    Wt Readings from Last 3 Encounters:  09/29/22 161 lb (73 kg)  09/15/22 164 lb 12.8 oz (74.8 kg)  07/07/22 165 lb 12.8 oz (75.2 kg)    Physical Exam Vitals reviewed.  HENT:     Head: Normocephalic and atraumatic.  Eyes:     Pupils: Pupils are equal, round, and reactive to light.  Cardiovascular:     Rate and Rhythm: Normal rate and regular rhythm.     Heart sounds: Normal heart sounds.  Pulmonary:     Effort: Pulmonary effort is normal.     Breath sounds: Normal breath sounds.  Abdominal:     General: Bowel sounds are normal.     Palpations: Abdomen is soft.  Musculoskeletal:        General: No tenderness or deformity. Normal range of motion.     Cervical back: Normal range of motion.  Lymphadenopathy:     Cervical: No cervical adenopathy.  Skin:    General: Skin is warm and dry.     Findings: No erythema or rash.  Neurological:     Mental Status: He is alert and oriented to person, place, and time.  Psychiatric:        Behavior: Behavior normal.        Thought Content: Thought content normal.        Judgment: Judgment normal.      Lab Results  Component Value Date   WBC 10.0 09/29/2022   HGB 11.8 (L) 09/29/2022   HCT 35.6 (L) 09/29/2022   MCV 93.9 09/29/2022   PLT 374 09/29/2022     Chemistry      Component Value Date/Time   NA 138 07/07/2022 1140   NA 134 (L) 03/31/2014 1432   K 4.6 07/07/2022 1140   K 4.6 03/31/2014 1432   CL 103 07/07/2022 1140   CO2 25 07/07/2022 1140   CO2 23 03/31/2014 1432   BUN 22 07/07/2022 1140   BUN 17.2 03/31/2014 1432   CREATININE 1.11 07/07/2022 1140   CREATININE 1.0 03/31/2014 1432      Component Value Date/Time   CALCIUM 9.5 07/07/2022 1140   CALCIUM 9.5 03/31/2014 1432   ALKPHOS 55 07/07/2022 1140   ALKPHOS 63 02/16/2014 1140   AST 16 07/07/2022 1140   AST 15 02/16/2014 1140   ALT 9 07/07/2022 1140   ALT 12 02/16/2014 1140   BILITOT 0.3 07/07/2022 1140   BILITOT 0.38 02/16/2014 1140      Impression  and  Plan: Dustin Mccarthy is a 82 year old white male.  He has diabetes.  He has mild anemia.  He responded incredibly well to the Aranesp.  He responded well to iron.  He does not need any injections today.  We will plan to get him back in 3 months.  I am just happy that he has done so well with respect to the iron and to the ESA injections.  Thankfully, he has great support from his family.  I know this might be a tough month for him given that his wife passed away at the end of 10-21-2022.Josph Macho, MD 5/3/202412:34 PM

## 2022-10-02 ENCOUNTER — Telehealth: Payer: Self-pay

## 2022-10-02 NOTE — Telephone Encounter (Signed)
Advised via MyChart.

## 2022-10-02 NOTE — Telephone Encounter (Signed)
-----   Message from Josph Macho, MD sent at 09/29/2022  5:40 PM EDT ----- Please call and let him know that the iron level is okay.  Thanks.  Cindee Lame

## 2022-10-04 ENCOUNTER — Ambulatory Visit: Payer: Medicare Other | Attending: Internal Medicine | Admitting: Speech Pathology

## 2022-10-04 ENCOUNTER — Telehealth: Payer: Self-pay | Admitting: *Deleted

## 2022-10-04 DIAGNOSIS — R498 Other voice and resonance disorders: Secondary | ICD-10-CM | POA: Insufficient documentation

## 2022-10-04 NOTE — Telephone Encounter (Signed)
-----   Message from Josph Macho, MD sent at 10/04/2022  2:15 PM EDT ----- Call and please let him know that the chest x-ray looks okay.  Thanks.  Cindee Lame

## 2022-10-04 NOTE — Patient Instructions (Addendum)
  Continue your straw exercises and humms  We want you to feel the air come out of your mouth and nose - a buzz in your mouth or nose  We don't want you to hold the air in your throat - that makes the rough, horse sound - let the air out  When you do these exercises - listen to your voice - if it sounds hoarse or rough, make the MMMM more and correct it -  Speak out and up to get the voice out of your throat   MMMMMMMM  Mama  Maybe  Maybe tomorrow  Make more money  My mother makes me muffins  MMMMMOK  MMMM yeah  MMM alright  MMM Gwyndolyn Kaufman  MMM How is Toniann Fail?  MMM How's work, Surveyor, minerals?  MMM What kind of wine?  MMM How are you?  MMMM How's school going?  MMMM [place] cheese Steak please  MMMM I'll have the marsala  MMMM What's up Ed?  MMM Hey Angie! How are you?  MMM when are you moving?

## 2022-10-04 NOTE — Therapy (Signed)
OUTPATIENT SPEECH LANGUAGE PATHOLOGY TREATMENT NOTE   Patient Name: Dustin Mccarthy MRN: [id number] DOB:1940-10-06, 82 y.o., male Today's Date: 10/04/2022  PCP: Merri Brunette MD REFERRING PROVIDER: Merri Brunette MD  END OF SESSION:   End of Session - 10/04/22 1452     Visit Number 2    Number of Visits 5    Date for SLP Re-Evaluation 10/20/22    Authorization Type Medicare    SLP Start Time 1400    SLP Stop Time  1445    SLP Time Calculation (min) 45 min    Activity Tolerance Patient tolerated treatment well             Past Medical History:  Diagnosis Date   Arthritis    CAD (coronary artery disease)    mild (cath 2010)   Constipation 02/24/2014   DOE (dyspnea on exertion)    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   GERD (gastroesophageal reflux disease)    Herpes simplex conjunctivitis    Hyperlipidemia    Hypertension    Hyponatremia 02/24/2014   Iron deficiency anemia due to chronic blood loss 07/07/2019   Obesity    Right shoulder pain 02/24/2014   Type 2 diabetes mellitus (HCC)    Unspecified deficiency anemia 02/16/2014   Weight loss 02/16/2014   Past Surgical History:  Procedure Laterality Date   BACK SURGERY  1988   CARDIAC CATHETERIZATION  2010   after abnormal stress test (03/2009) - mild coronary disease   Cardiometabolic Testing  02/2012   RER of 1.06, peak VO2 84% predicted; HR 87% predicted   HERNIA REPAIR     Skull Fracture Surgery  1945   TRANSTHORACIC ECHOCARDIOGRAM  03/2009   EF 45-50%, mild conc LVH, mod septal hypokinesis, mod apical wall hypokinesis; RV systolic function borderline reduced; trace MR/TR   Patient Active Problem List   Diagnosis Date Noted   Abnormal gait 02/11/2021   Other insect allergy status 02/11/2021   Arteriosclerosis of carotid artery 02/11/2021   Aspirin long-term use 02/11/2021   Benign prostatic hyperplasia 02/11/2021   Body mass index (BMI) 27.0-27.9, adult 02/11/2021   Chronic fatigue syndrome 02/11/2021    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) 02/11/2021   Contracture of palmar fascia 02/11/2021   Dyspnea on exertion 02/11/2021   Edema 02/11/2021   Encounter for fitting and adjustment of hearing aid 02/11/2021   Encounter for general adult medical examination with abnormal findings 02/11/2021   Family history of Parkinson disease 02/11/2021   Esophageal reflux 02/11/2021   Hearing loss 02/11/2021   History of squamous cell carcinoma of skin 02/11/2021   Hyperlipidemia 02/11/2021   Long term (current) use of insulin (HCC) 02/11/2021   Physical deconditioning 02/11/2021   Pure hypercholesterolemia 02/11/2021   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   Tremor 02/11/2021   Trochanteric bursitis of left hip 02/11/2021   Iron deficiency anemia due to chronic blood loss 07/07/2019   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   Dysphasia 06/13/2016   Globus pharyngeus 06/13/2016   Hoarseness 06/13/2016   Effusion of right shoulder 05/18/2014   Right shoulder pain 02/24/2014   Constipation 02/24/2014   Hyponatremia 02/24/2014   Reactive thrombocytosis 02/24/2014   Deficiency anemia 02/16/2014   Weight loss 02/16/2014   Leukocytosis 02/16/2014   Essential hypertension 04/01/2013   Insulin dependent diabetes mellitus without complication 04/01/2013   Dyslipidemia 04/01/2013   Coronary artery disease due to lipid rich plaque 04/01/2013    ONSET DATE: 09/19/22 (referral  date)  REFERRING DIAG: R49.0 (ICD-10-CM) - Dysphonia   THERAPY DIAG:  Other voice and resonance disorders  Rationale for Evaluation and Treatment: Rehabilitation  SUBJECTIVE:   SUBJECTIVE STATEMENT: "I've been doing the exercises but there hasn't been a change"  PAIN:  Are you having pain? No  OBJECTIVE:   TODAY'S TREATMENT:                                                                                                                                          DATE:   10/04/22: [name] reports completing SOVTE and resonant voice exercise (hum) at home consistently. Initiated resonant voice at syllable level to achieve clear phonation 70% of trials. Progressed to frequently used phrases and short sentences. Rashon was able to achieve clear phonatoin 70% of utterances with frequent mod verbal cues and consistent modeling. He required usual verbal cues to ID clear vs rough voice. Carry over to clear phonation in conversation 50% of utterances. See patient instructions.   09/22/22: instruct pt in initial HEP- semi occluded vocal tract exercises (SOVTE) using straw phonation. Pt able to achieve with sustained phonation, unable to complete with glides, siren, or song. Instruct on vocal function exercises (VFE) , with pt able to achieve clear voicing with "ol" during sustained phonation and glides. Trial resonant voice, without successful clear voicing achieved at vowel or word level. Education provided, with handout, on throat clear alternatives. Reflux management recommendations provided, though pt denies overt s/sx of reflux. Education provided on "silent" reflux.    PATIENT EDUCATION: Education details: see above Person educated: Patient Education method: Solicitor, Verbal cues, and Handouts Education comprehension: verbalized understanding, returned demonstration, and needs further education   HOME EXERCISE PROGRAM: SOTVE, VFE   GOALS: Goals reviewed with patient? Yes   SHORT TERM GOALS = LONG TERM GOALS D/T LENGTH OF POC: Target date: 10/20/2022   Pt will correctly complete voice exercises (SOVTE, VFE) with occasional min-A over 2 sessions  Baseline: Goal status: INITIAL   2.  Pt will demonstrate improved vocal quality at sentence level 18/20 trials with occasional mod-A Baseline:  Goal status: INITIAL   3.  Pt will report subjective perception of improved vocal quality during phone call with son  Baseline:  Goal status: INITIAL    4.  Pt will report completion of HEP 5/7 days over 1 week period Baseline:  Goal status: INITIAL     ASSESSMENT:   CLINICAL IMPRESSION: Patient is a 82 y.o. M who was seen today for voice evaluation. Pt presents with mild dysphonia c/b hoarseness and decreased volume. Pt reporting dysphonia ongoing for 3 months. Tells SLP he is not bothered by voice, just wanted to make sure nothing was causing. 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 Edema noted on exam which may indicate reflux, though pt denies overt s/sx of GERD. Pt reporting has implemented dietary changes recommended by Dr.  Skotnicki. Pt able to achieve clear voicing during vocal function exercises and demonstrate straw phonation at sustained phonation level. Initiated HEP. Recommend ST to address dysphonia.    OBJECTIVE IMPAIRMENTS: include voice disorder. These impairments are limiting patient from effectively communicating at home and in community. Factors affecting potential to achieve goals and functional outcome are cooperation/participation level. Patient will benefit from skilled SLP services to address above impairments and improve overall function.   REHAB POTENTIAL: Good   PLAN:   SLP FREQUENCY: 1-2x/week   SLP DURATION: 4 weeks   PLANNED INTERVENTIONS: Cueing hierachy, Internal/external aids, Functional tasks, SLP instruction and feedback, Compensatory strategies, and Patient/family education         Alice Reichert Radene Journey, CCC-SLP 10/04/2022, 2:52 PM

## 2022-10-11 ENCOUNTER — Ambulatory Visit: Payer: Medicare Other | Admitting: Speech Pathology

## 2022-10-11 ENCOUNTER — Encounter: Payer: Self-pay | Admitting: Speech Pathology

## 2022-10-11 DIAGNOSIS — R498 Other voice and resonance disorders: Secondary | ICD-10-CM | POA: Diagnosis not present

## 2022-10-11 NOTE — Therapy (Signed)
OUTPATIENT SPEECH LANGUAGE PATHOLOGY TREATMENT NOTE   Patient Name: Dustin Mccarthy MRN: [id number] DOB:1940-12-13, 82 y.o., male Today's Date: 10/11/2022  PCP: Dustin Brunette MD REFERRING PROVIDER: Merri Brunette MD  END OF SESSION:   End of Session - 10/11/22 1234     Visit Number 3    Number of Visits 5    Date for SLP Re-Evaluation 10/20/22    Authorization Type Medicare    SLP Start Time 1230    SLP Stop Time  1315    SLP Time Calculation (min) 45 min    Activity Tolerance Patient tolerated treatment well             Past Medical History:  Diagnosis Date   Arthritis    CAD (coronary artery disease)    mild (cath 2010)   Constipation 02/24/2014   DOE (dyspnea on exertion)    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   GERD (gastroesophageal reflux disease)    Herpes simplex conjunctivitis    Hyperlipidemia    Hypertension    Hyponatremia 02/24/2014   Iron deficiency anemia due to chronic blood loss 07/07/2019   Obesity    Right shoulder pain 02/24/2014   Type 2 diabetes mellitus (HCC)    Unspecified deficiency anemia 02/16/2014   Weight loss 02/16/2014   Past Surgical History:  Procedure Laterality Date   BACK SURGERY  1988   CARDIAC CATHETERIZATION  2010   after abnormal stress test (03/2009) - mild coronary disease   Cardiometabolic Testing  02/2012   RER of 1.06, peak VO2 84% predicted; HR 87% predicted   HERNIA REPAIR     Skull Fracture Surgery  1945   TRANSTHORACIC ECHOCARDIOGRAM  03/2009   EF 45-50%, mild conc LVH, mod septal hypokinesis, mod apical wall hypokinesis; RV systolic function borderline reduced; trace MR/TR   Patient Active Problem List   Diagnosis Date Noted   Abnormal gait 02/11/2021   Other insect allergy status 02/11/2021   Arteriosclerosis of carotid artery 02/11/2021   Aspirin long-term use 02/11/2021   Benign prostatic hyperplasia 02/11/2021   Body mass index (BMI) 27.0-27.9, adult 02/11/2021   Chronic fatigue syndrome 02/11/2021    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) 02/11/2021   Contracture of palmar fascia 02/11/2021   Dyspnea on exertion 02/11/2021   Edema 02/11/2021   Encounter for fitting and adjustment of hearing aid 02/11/2021   Encounter for general adult medical examination with abnormal findings 02/11/2021   Family history of Parkinson disease 02/11/2021   Esophageal reflux 02/11/2021   Hearing loss 02/11/2021   History of squamous cell carcinoma of skin 02/11/2021   Hyperlipidemia 02/11/2021   Long term (current) use of insulin (HCC) 02/11/2021   Physical deconditioning 02/11/2021   Pure hypercholesterolemia 02/11/2021   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   Tremor 02/11/2021   Trochanteric bursitis of left hip 02/11/2021   Iron deficiency anemia due to chronic blood loss 07/07/2019   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   Dysphasia 06/13/2016   Globus pharyngeus 06/13/2016   Hoarseness 06/13/2016   Effusion of right shoulder 05/18/2014   Right shoulder pain 02/24/2014   Constipation 02/24/2014   Hyponatremia 02/24/2014   Reactive thrombocytosis 02/24/2014   Deficiency anemia 02/16/2014   Weight loss 02/16/2014   Leukocytosis 02/16/2014   Essential hypertension 04/01/2013   Insulin dependent diabetes mellitus without complication 04/01/2013   Dyslipidemia 04/01/2013   Coronary artery disease due to lipid rich plaque 04/01/2013    ONSET DATE: 09/19/22 (referral  date)  REFERRING DIAG: R49.0 (ICD-10-CM) - Dysphonia   THERAPY DIAG:  Other voice and resonance disorders  Rationale for Evaluation and Treatment: Rehabilitation  SUBJECTIVE:   SUBJECTIVE STATEMENT: "It hasn't helped much"  PAIN:  Are you having pain? No  OBJECTIVE:   TODAY'S TREATMENT:                                                                                                                                         DATE:   10/11/22: Dustin Mccarthy has been  completing SOVTE incorrectly, blowing without phonation. After initial modeling, Dustin Mccarthy completed straw hums, glides and anthem with rare min A. Targeted muscle tension phonation with Flow phonation. At syllable, word and phrase Dustin Mccarthy achieved clear phonation with frequent mod modeling, gestures to emphasize flow sounds and verbal cues to feel air flow. In structured task using carrier phrase "my animal..." in 3 sentences to describe an animal with flow and resonant cues, Dustin Mccarthy achieved clear phonation 15/20 sentences. He noted "my voice sounds better" Provided and trained in Dustin Mccarthy to add to SOVTE flow syllables, phrases and sentences 3x each. Instructed Dustin Mccarthy to use straw phonation prior to going out to eat or getting together with family and friends. He demonstrated Dustin Mccarthy with occasional min A. In conversation, Dustin Mccarthy achieved clear phonation 60% of utterances with frequent modeling, gestures and use of exaggerated flow sounds.   5/8/24[name] reports completing SOVTE and resonant voice exercise (hum) at home consistently. Initiated resonant voice at syllable level to achieve clear phonation 70% of trials. Progressed to frequently used phrases and short sentences. Dustin Mccarthy was able to achieve clear phonatoin 70% of utterances with frequent mod verbal cues and consistent modeling. He required usual verbal cues to ID clear vs rough voice. Carry over to clear phonation in conversation 50% of utterances. See patient instructions.   09/22/22: instruct pt in initial Dustin Mccarthy- semi occluded vocal tract exercises (SOVTE) using straw phonation. Pt able to achieve with sustained phonation, unable to complete with glides, siren, or song. Instruct on vocal function exercises (VFE) , with pt able to achieve clear voicing with "ol" during sustained phonation and glides. Trial resonant voice, without successful clear voicing achieved at vowel or word level. Education provided, with handout, on throat clear alternatives. Reflux management  recommendations provided, though pt denies overt s/sx of reflux. Education provided on "silent" reflux.    PATIENT EDUCATION: Education details: see above Person educated: Patient Education method: Solicitor, Verbal cues, and Handouts Education comprehension: verbalized understanding, returned demonstration, and needs further education   HOME EXERCISE PROGRAM: SOTVE, VFE   GOALS: Goals reviewed with patient? Yes   SHORT TERM GOALS = LONG TERM GOALS D/T LENGTH OF POC: Target date: 10/20/2022   Pt will correctly complete voice exercises (SOVTE, VFE) with occasional min-A over 2 sessions  Baseline: Goal status: ONGOING   2.  Pt will demonstrate improved vocal quality at  sentence level 18/20 trials with occasional mod-A Baseline:  Goal status: ONGOING   3.  Pt will report subjective perception of improved vocal quality during phone call with son  Baseline:  Goal status: ONGOING   4.  Pt will report completion of Dustin Mccarthy 5/7 days over 1 week period Baseline:  Goal status:ONGOING     ASSESSMENT:   CLINICAL IMPRESSION: Patient is a 82 y.o. M who was seen today for voice evaluation. Pt presents with mild dysphonia c/b hoarseness and decreased volume.  Pt able to achieve clear voicing during flow phonation exercises at syllable, phrase and sentence and demonstrate straw phonation at sustained phonation level. Using flow phonation in conversation and carryover outside of ST continues to require mod to max A. Ongoing training in Dustin Mccarthy. Recommend ST to address dysphonia for QOL.    OBJECTIVE IMPAIRMENTS: include voice disorder. These impairments are limiting patient from effectively communicating at home and in community. Factors affecting potential to achieve goals and functional outcome are cooperation/participation level. Patient will benefit from skilled SLP services to address above impairments and improve overall function.   REHAB POTENTIAL: Good   PLAN:   SLP  FREQUENCY: 1-2x/week   SLP DURATION: 4 weeks   PLANNED INTERVENTIONS: Cueing hierachy, Internal/external aids, Functional tasks, SLP instruction and feedback, Compensatory strategies, and Patient/family education         Alice Reichert Radene Journey, CCC-SLP 10/11/2022, 12:35 PM

## 2022-10-11 NOTE — Patient Instructions (Signed)
    You have tension in your larynx limiting the air flow when you speak, resulting in a hoarse or gravelly voice. We call this "pressed speech"   We use flow phonation to improve proper air flow during speech. Sounds  that promote air flow include: s, z, sh, th, f, v, ch. We call this "flow speech"  After you complete the semi-occluded vocal tract exercises (straw humming, gliding and anthem & gargling), complete the following twice a day  Use a tissue to focus on airflow:  Whooo 10x Shoe 10x Sue 10x  Shoe-Fu Sue-Pu He- She Fu-Sue Pu-Lu  Start with a quiet sound and alternate with a noisy sound  Who are you?  Who is Fannie Knee?   Twenty Thirty  Forty Fifty Sixty Seventy Eighty Ninety Hundred  She sells sea shells  See Sue's shoes  Fifty-Fifty  Hit the hammer  High school hero  Hocus Pocus  To each his own  Zebra's zig zag at the zoo  [employer] chews cheddar cheese  Choosy moms choose [employer]  Fat Freddy prefers french fries  Vince vowed to vote  Feel the furry fish  She should polish her shoes  Show them the fresh fruit  Teachers eat ripe peaches at [employer]  Not now nor never  My mama makes me muffins  No one knows Norman's nickname  See Kennon Rounds Sleep soundly by the sea

## 2022-10-18 ENCOUNTER — Encounter: Payer: Self-pay | Admitting: Speech Pathology

## 2022-10-18 ENCOUNTER — Ambulatory Visit: Payer: Medicare Other | Admitting: Speech Pathology

## 2022-10-18 DIAGNOSIS — R498 Other voice and resonance disorders: Secondary | ICD-10-CM | POA: Diagnosis not present

## 2022-10-18 NOTE — Therapy (Signed)
OUTPATIENT SPEECH LANGUAGE PATHOLOGY TREATMENT NOTE   Patient Name: Dustin Mccarthy MRN: [id number] DOB:February 11, 1941, 82 y.o., male Today's Date: 10/18/2022  PCP: Merri Brunette MD REFERRING PROVIDER: Merri Brunette MD  END OF SESSION:   End of Session - 10/18/22 1225     Visit Number 4    Number of Visits 5    Date for SLP Re-Evaluation 10/20/22    Authorization Type Medicare    SLP Start Time 1230    SLP Stop Time  1315    SLP Time Calculation (min) 45 min    Activity Tolerance Patient tolerated treatment well             Past Medical History:  Diagnosis Date   Arthritis    CAD (coronary artery disease)    mild (cath 2010)   Constipation 02/24/2014   DOE (dyspnea on exertion)    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   GERD (gastroesophageal reflux disease)    Herpes simplex conjunctivitis    Hyperlipidemia    Hypertension    Hyponatremia 02/24/2014   Iron deficiency anemia due to chronic blood loss 07/07/2019   Obesity    Right shoulder pain 02/24/2014   Type 2 diabetes mellitus (HCC)    Unspecified deficiency anemia 02/16/2014   Weight loss 02/16/2014   Past Surgical History:  Procedure Laterality Date   BACK SURGERY  1988   CARDIAC CATHETERIZATION  2010   after abnormal stress test (03/2009) - mild coronary disease   Cardiometabolic Testing  02/2012   RER of 1.06, peak VO2 84% predicted; HR 87% predicted   HERNIA REPAIR     Skull Fracture Surgery  1945   TRANSTHORACIC ECHOCARDIOGRAM  03/2009   EF 45-50%, mild conc LVH, mod septal hypokinesis, mod apical wall hypokinesis; RV systolic function borderline reduced; trace MR/TR   Patient Active Problem List   Diagnosis Date Noted   Abnormal gait 02/11/2021   Other insect allergy status 02/11/2021   Arteriosclerosis of carotid artery 02/11/2021   Aspirin long-term use 02/11/2021   Benign prostatic hyperplasia 02/11/2021   Body mass index (BMI) 27.0-27.9, adult 02/11/2021   Chronic fatigue syndrome 02/11/2021    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) 02/11/2021   Contracture of palmar fascia 02/11/2021   Dyspnea on exertion 02/11/2021   Edema 02/11/2021   Encounter for fitting and adjustment of hearing aid 02/11/2021   Encounter for general adult medical examination with abnormal findings 02/11/2021   Family history of Parkinson disease 02/11/2021   Esophageal reflux 02/11/2021   Hearing loss 02/11/2021   History of squamous cell carcinoma of skin 02/11/2021   Hyperlipidemia 02/11/2021   Long term (current) use of insulin (HCC) 02/11/2021   Physical deconditioning 02/11/2021   Pure hypercholesterolemia 02/11/2021   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   Tremor 02/11/2021   Trochanteric bursitis of left hip 02/11/2021   Iron deficiency anemia due to chronic blood loss 07/07/2019   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   Dysphasia 06/13/2016   Globus pharyngeus 06/13/2016   Hoarseness 06/13/2016   Effusion of right shoulder 05/18/2014   Right shoulder pain 02/24/2014   Constipation 02/24/2014   Hyponatremia 02/24/2014   Reactive thrombocytosis 02/24/2014   Deficiency anemia 02/16/2014   Weight loss 02/16/2014   Leukocytosis 02/16/2014   Essential hypertension 04/01/2013   Insulin dependent diabetes mellitus without complication 04/01/2013   Dyslipidemia 04/01/2013   Coronary artery disease due to lipid rich plaque 04/01/2013    ONSET DATE: 09/19/22 (referral  date)  REFERRING DIAG: R49.0 (ICD-10-CM) - Dysphonia   THERAPY DIAG:  Other voice and resonance disorders  Rationale for Evaluation and Treatment: Rehabilitation  SUBJECTIVE:   SUBJECTIVE STATEMENT: "I'm still hoarse"  PAIN:  Are you having pain? No  OBJECTIVE:   TODAY'S TREATMENT:                                                                                                                                         DATE:   10/18/22: Dustin Mccarthy enters with  hoarse voice - SOVTE completed with usual mod A as he started with anthem, cues to do hum, glides and accents before anthem. Stretch-Flow exercises to recalibrate flow phonation. In flow syllables, phrases and sentences, Dustin Mccarthy achieved clear phonation 80% - he Id'd gravelly voice with rare min verbal cues. In conversation, Dustin Mccarthy required frequent mod A - modeling, verbal cues to correct voice - he is able to correct voice in therapy with the cues, however is not correcting outside of therapy. Education provided that Dustin Mccarthy needs to think about how is is speaking to carryover clear phonation outside of therapy and consistently complete HEP for SOVTE and flow sentences.   10/11/22: Dustin Mccarthy has been completing SOVTE incorrectly, blowing without phonation. After initial modeling, Dustin Mccarthy completed straw hums, glides and anthem with rare min A. Targeted muscle tension phonation with Flow phonation. At syllable, word and phrase Dustin Mccarthy achieved clear phonation with frequent mod modeling, gestures to emphasize flow sounds and verbal cues to feel air flow. In structured task using carrier phrase "my animal..." in 3 sentences to describe an animal with flow and resonant cues, Dustin Mccarthy achieved clear phonation 15/20 sentences. He noted "my voice sounds better" Provided and trained in HEP to add to SOVTE flow syllables, phrases and sentences 3x each. Instructed Dustin Mccarthy to use straw phonation prior to going out to eat or getting together with family and friends. He demonstrated HEP with occasional min A. In conversation, Dustin Mccarthy achieved clear phonation 60% of utterances with frequent modeling, gestures and use of exaggerated flow sounds.   5/8/24[name] reports completing SOVTE and resonant voice exercise (hum) at home consistently. Initiated resonant voice at syllable level to achieve clear phonation 70% of trials. Progressed to frequently used phrases and short sentences. Dustin Mccarthy was able to achieve clear phonatoin 70% of utterances with  frequent mod verbal cues and consistent modeling. He required usual verbal cues to ID clear vs rough voice. Carry over to clear phonation in conversation 50% of utterances. See patient instructions.   09/22/22: instruct pt in initial HEP- semi occluded vocal tract exercises (SOVTE) using straw phonation. Pt able to achieve with sustained phonation, unable to complete with glides, siren, or song. Instruct on vocal function exercises (VFE) , with pt able to achieve clear voicing with "ol" during sustained phonation and glides. Trial resonant voice, without successful clear voicing achieved at vowel or word level. Education  provided, with handout, on throat clear alternatives. Reflux management recommendations provided, though pt denies overt s/sx of reflux. Education provided on "silent" reflux.    PATIENT EDUCATION: Education details: see above Person educated: Patient Education method: Solicitor, Verbal cues, and Handouts Education comprehension: verbalized understanding, returned demonstration, and needs further education   HOME EXERCISE PROGRAM: SOTVE, VFE   GOALS: Goals reviewed with patient? Yes   SHORT TERM GOALS = LONG TERM GOALS D/T LENGTH OF POC: Target date: 10/20/2022   Pt will correctly complete voice exercises (SOVTE, VFE) with occasional min-A over 2 sessions  Baseline: Goal status: ONGOING   2.  Pt will demonstrate improved vocal quality at sentence level 18/20 trials with occasional mod-A Baseline:  Goal status: ONGOING   3.  Pt will report subjective perception of improved vocal quality during phone call with son  Baseline:  Goal status: ONGOING   4.  Pt will report completion of HEP 5/7 days over 1 week period Baseline:  Goal status:ONGOING     ASSESSMENT:   CLINICAL IMPRESSION: Patient is a 82 y.o. M who was seen today for voice evaluation. Pt presents with mild dysphonia c/b hoarseness and decreased volume.  Pt able to achieve clear voicing  during flow phonation exercises at syllable, phrase and sentence and demonstrate straw phonation at sustained phonation level. Using flow phonation in conversation and carryover outside of ST continues to require mod to max A. Ongoing training in HEP. Recommend ST to address dysphonia for QOL.    OBJECTIVE IMPAIRMENTS: include voice disorder. These impairments are limiting patient from effectively communicating at home and in community. Factors affecting potential to achieve goals and functional outcome are cooperation/participation level. Patient will benefit from skilled SLP services to address above impairments and improve overall function.   REHAB POTENTIAL: Good   PLAN:   SLP FREQUENCY: 1-2x/week   SLP DURATION: 4 weeks   PLANNED INTERVENTIONS: Cueing hierachy, Internal/external aids, Functional tasks, SLP instruction and feedback, Compensatory strategies, and Patient/family education         Alice Reichert Radene Journey, CCC-SLP 10/18/2022, 3:07 PM

## 2022-10-18 NOTE — Patient Instructions (Addendum)
    Quanell - you can get a clear voice - relax your throat and let the air flow use an "h" sound if you need to  Do the straw hums   10 hums  10 pitch glides  10 sirens  Song  Exaggerate the flow sounds on your sentences  You have to think about how you are talking - do this when you are talking to your kids - if you sound gravelly - fix it!

## 2022-10-19 DIAGNOSIS — E113311 Type 2 diabetes mellitus with moderate nonproliferative diabetic retinopathy with macular edema, right eye: Secondary | ICD-10-CM | POA: Diagnosis not present

## 2022-11-07 DIAGNOSIS — T466X5A Adverse effect of antihyperlipidemic and antiarteriosclerotic drugs, initial encounter: Secondary | ICD-10-CM | POA: Diagnosis not present

## 2022-11-07 DIAGNOSIS — E785 Hyperlipidemia, unspecified: Secondary | ICD-10-CM | POA: Diagnosis not present

## 2022-11-07 DIAGNOSIS — Z794 Long term (current) use of insulin: Secondary | ICD-10-CM | POA: Diagnosis not present

## 2022-11-07 DIAGNOSIS — I10 Essential (primary) hypertension: Secondary | ICD-10-CM | POA: Diagnosis not present

## 2022-11-07 DIAGNOSIS — I6529 Occlusion and stenosis of unspecified carotid artery: Secondary | ICD-10-CM | POA: Diagnosis not present

## 2022-11-07 DIAGNOSIS — M791 Myalgia, unspecified site: Secondary | ICD-10-CM | POA: Diagnosis not present

## 2022-11-07 DIAGNOSIS — E11319 Type 2 diabetes mellitus with unspecified diabetic retinopathy without macular edema: Secondary | ICD-10-CM | POA: Diagnosis not present

## 2022-11-15 ENCOUNTER — Ambulatory Visit: Payer: Medicare Other | Attending: Internal Medicine | Admitting: Speech Pathology

## 2022-11-15 DIAGNOSIS — R498 Other voice and resonance disorders: Secondary | ICD-10-CM | POA: Insufficient documentation

## 2022-11-15 NOTE — Therapy (Signed)
OUTPATIENT SPEECH LANGUAGE PATHOLOGY TREATMENT NOTE & DISCHARGE SUMMARY   Patient Name: Dustin Mccarthy MRN: [id number] DOB:1940/08/15, 82 y.o., male Today's Date: 11/15/2022  PCP: Merri Brunette MD REFERRING PROVIDER: Merri Brunette MD  END OF SESSION:   End of Session - 11/15/22 1236     Visit Number 5    Number of Visits 5    Date for SLP Re-Evaluation 40/98/11 (re-cert completed)   SLP Start Time 1230    SLP Stop Time  1300    SLP Time Calculation (min) 30 min    Activity Tolerance Patient tolerated treatment well             Past Medical History:  Diagnosis Date   Arthritis    CAD (coronary artery disease)    mild (cath 2010)   Constipation 02/24/2014   DOE (dyspnea on exertion)    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   GERD (gastroesophageal reflux disease)    Herpes simplex conjunctivitis    Hyperlipidemia    Hypertension    Hyponatremia 02/24/2014   Iron deficiency anemia due to chronic blood loss 07/07/2019   Obesity    Right shoulder pain 02/24/2014   Type 2 diabetes mellitus (HCC)    Unspecified deficiency anemia 02/16/2014   Weight loss 02/16/2014   Past Surgical History:  Procedure Laterality Date   BACK SURGERY  1988   CARDIAC CATHETERIZATION  2010   after abnormal stress test (03/2009) - mild coronary disease   Cardiometabolic Testing  02/2012   RER of 1.06, peak VO2 84% predicted; HR 87% predicted   HERNIA REPAIR     Skull Fracture Surgery  1945   TRANSTHORACIC ECHOCARDIOGRAM  03/2009   EF 45-50%, mild conc LVH, mod septal hypokinesis, mod apical wall hypokinesis; RV systolic function borderline reduced; trace MR/TR   Patient Active Problem List   Diagnosis Date Noted   Abnormal gait 02/11/2021   Other insect allergy status 02/11/2021   Arteriosclerosis of carotid artery 02/11/2021   Aspirin long-term use 02/11/2021   Benign prostatic hyperplasia 02/11/2021   Body mass index (BMI) 27.0-27.9, adult 02/11/2021   Chronic fatigue syndrome  02/11/2021   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) 02/11/2021   Contracture of palmar fascia 02/11/2021   Dyspnea on exertion 02/11/2021   Edema 02/11/2021   Encounter for fitting and adjustment of hearing aid 02/11/2021   Encounter for general adult medical examination with abnormal findings 02/11/2021   Family history of Parkinson disease 02/11/2021   Esophageal reflux 02/11/2021   Hearing loss 02/11/2021   History of squamous cell carcinoma of skin 02/11/2021   Hyperlipidemia 02/11/2021   Long term (current) use of insulin (HCC) 02/11/2021   Physical deconditioning 02/11/2021   Pure hypercholesterolemia 02/11/2021   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   Tremor 02/11/2021   Trochanteric bursitis of left hip 02/11/2021   Iron deficiency anemia due to chronic blood loss 07/07/2019   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   Dysphasia 06/13/2016   Globus pharyngeus 06/13/2016   Hoarseness 06/13/2016   Effusion of right shoulder 05/18/2014   Right shoulder pain 02/24/2014   Constipation 02/24/2014   Hyponatremia 02/24/2014   Reactive thrombocytosis 02/24/2014   Deficiency anemia 02/16/2014   Weight loss 02/16/2014   Leukocytosis 02/16/2014   Essential hypertension 04/01/2013   Insulin dependent diabetes mellitus without complication 04/01/2013   Dyslipidemia 04/01/2013   Coronary artery disease due to lipid rich plaque 04/01/2013    ONSET DATE: 09/19/22 (referral date)  REFERRING DIAG: R49.0 (ICD-10-CM) - Dysphonia   THERAPY DIAG:  Other voice and resonance disorders  Rationale for Evaluation and Treatment: Rehabilitation  SUBJECTIVE:   SUBJECTIVE STATEMENT: "My grandson said my voice sounded good over the phone"  PAIN:  Are you having pain? No  OBJECTIVE:   TODAY'S TREATMENT:                                                                                                                                          DATE:   11/15/22: Marlee enters with clear voice. He has been completing HEP 2x a day 5/7 days, only [place] not [place]. He reports his son and grandson have noted that his voice now sounds "normal." Shloime demonstrated HEP with mod I. In oral reading he maintained clear phonation over 10/10 sentences. In role play using louder volume place his order in a restaurant and in conversational phrases/greetings, clear phonation was maintained with mod I. Over 18 minute conversation, Nikodem maintained clear phonation. He reports he is maintaining a clear voice consistently.  Kay was noted to clear his throat 3x, after this was Id'd, he did not throat clear the remainder of the session. He does endorse he has been working on eliminating throat clears by swallowing or sipping (strategies we trained) Due to length of time since last session (he came at the wrong time last week so he rescheduled for today) re-cert completed as well as d/c.  10/18/22: [name] enters with hoarse voice - SOVTE completed with usual mod A as he started with anthem, cues to do hum, glides and accents before anthem. Stretch-Flow exercises to recalibrate flow phonation. In flow syllables, phrases and sentences, Jailon achieved clear phonation 80% - he Id'd gravelly voice with rare min verbal cues. In conversation, Cion required frequent mod A - modeling, verbal cues to correct voice - he is able to correct voice in therapy with the cues, however is not correcting outside of therapy. Education provided that Erion needs to think about how is is speaking to carryover clear phonation outside of therapy and consistently complete HEP for SOVTE and flow sentences.   10/11/22: Bocephus has been completing SOVTE incorrectly, blowing without phonation. After initial modeling, Ulices completed straw hums, glides and anthem with rare min A. Targeted muscle tension phonation with Flow phonation. At syllable, word and phrase  Alaric achieved clear phonation with frequent mod modeling, gestures to emphasize flow sounds and verbal cues to feel air flow. In structured task using carrier phrase "my animal..." in 3 sentences to describe an animal with flow and resonant cues, God achieved clear phonation 15/20 sentences. He noted "my voice sounds better" Provided and trained in HEP to add to SOVTE flow syllables, phrases and sentences 3x each. Instructed Jacky to use straw phonation prior to going out to eat or getting together with family  and friends. He demonstrated HEP with occasional min A. In conversation, Tori achieved clear phonation 60% of utterances with frequent modeling, gestures and use of exaggerated flow sounds.   5/8/24[name] reports completing SOVTE and resonant voice exercise (hum) at home consistently. Initiated resonant voice at syllable level to achieve clear phonation 70% of trials. Progressed to frequently used phrases and short sentences. Naiem was able to achieve clear phonatoin 70% of utterances with frequent mod verbal cues and consistent modeling. He required usual verbal cues to ID clear vs rough voice. Carry over to clear phonation in conversation 50% of utterances. See patient instructions.   09/22/22: instruct pt in initial HEP- semi occluded vocal tract exercises (SOVTE) using straw phonation. Pt able to achieve with sustained phonation, unable to complete with glides, siren, or song. Instruct on vocal function exercises (VFE) , with pt able to achieve clear voicing with "ol" during sustained phonation and glides. Trial resonant voice, without successful clear voicing achieved at vowel or word level. Education provided, with handout, on throat clear alternatives. Reflux management recommendations provided, though pt denies overt s/sx of reflux. Education provided on "silent" reflux.    PATIENT EDUCATION: Education details: see above Person educated: Patient Education method: Advertising account executive, Verbal cues, and Handouts Education comprehension: verbalized understanding, returned demonstration, and needs further education   HOME EXERCISE PROGRAM: SOTVE, VFE   GOALS: Goals reviewed with patient? Yes   SPEECH THERAPY DISCHARGE SUMMARY  Visits from Start of Care: 5  Current functional level related to goals / functional outcomes: See goals below   Remaining deficits: Intermittent throat clears - he continues to work on eliminated this   Education / Equipment: HEP for voice, strategies to achieve and maintain clear phonatoin   Patient agrees to discharge. Patient goals were met. Patient is being discharged due to meeting the stated rehab goals.Marland Kitchen     SHORT TERM GOALS = LONG TERM GOALS D/T LENGTH OF POC: Target date: 10/20/2022   Pt will correctly complete voice exercises (SOVTE, VFE) with occasional min-A over 2 sessions  Baseline: Goal status: MET   2.  Pt will demonstrate improved vocal quality at sentence level 18/20 trials with occasional mod-A Baseline:  Goal status: MET   3.  Pt will report subjective perception of improved vocal quality during phone call with son  Baseline:  Goal status:MET   4.  Pt will report completion of HEP 5/7 days over 1 week period Baseline:  Goal status:MET     ASSESSMENT:   CLINICAL IMPRESSION: Kamai has been consistently completing HEP twice a day. He has maintained clear phonation and demonstrated HEP with mod I. Family has noted improved voice. Patti is to continue HEP 5/7 days for 4 more weeks to maximize maintenance of clear phonation across setting. Goals met, d/c ST, however due to length of time since last session re-cert also completed.    OBJECTIVE IMPAIRMENTS: include voice disorder. These impairments are limiting patient from effectively communicating at home and in community. Factors affecting potential to achieve goals and functional outcome are cooperation/participation level. Patient will benefit  from skilled SLP services to address above impairments and improve overall function.   REHAB POTENTIAL: Good   PLAN:   SLP FREQUENCY: 1-2x/week   SLP DURATION: 4 weeks   PLANNED INTERVENTIONS: Cueing hierachy, Internal/external aids, Functional tasks, SLP instruction and feedback, Compensatory strategies, and Patient/family education         Alice Reichert Radene Journey, CCC-SLP 11/15/2022, 12:37 PM

## 2022-11-15 NOTE — Patient Instructions (Addendum)
   Great job doing the practice and carrying over the strategies to maintain a clear voice  You have done a great job!!  I did want to you come back today to ensure that you have been able to carryover a clear voice in a variety of situations and places - not just in ST  Try to do the exercises at least 5/7 days for 4 more weeks  Use the straw exercises for 2-3 minutes to warm up your voice before going out to socialize  Keep being mindful of how your voice sounds - if you hear the frog, fix it!!! If you need to get back to your exercises, do so!

## 2022-12-18 DIAGNOSIS — E113311 Type 2 diabetes mellitus with moderate nonproliferative diabetic retinopathy with macular edema, right eye: Secondary | ICD-10-CM | POA: Diagnosis not present

## 2022-12-29 ENCOUNTER — Inpatient Hospital Stay: Payer: Medicare Other | Admitting: Hematology & Oncology

## 2022-12-29 ENCOUNTER — Inpatient Hospital Stay: Payer: Medicare Other

## 2022-12-29 ENCOUNTER — Other Ambulatory Visit: Payer: Self-pay

## 2022-12-29 ENCOUNTER — Inpatient Hospital Stay: Payer: Medicare Other | Attending: Hematology & Oncology

## 2022-12-29 ENCOUNTER — Encounter: Payer: Self-pay | Admitting: Hematology & Oncology

## 2022-12-29 VITALS — BP 142/58 | HR 64 | Temp 98.7°F | Resp 18 | Ht 66.0 in | Wt 162.8 lb

## 2022-12-29 DIAGNOSIS — D631 Anemia in chronic kidney disease: Secondary | ICD-10-CM | POA: Insufficient documentation

## 2022-12-29 DIAGNOSIS — N189 Chronic kidney disease, unspecified: Secondary | ICD-10-CM | POA: Diagnosis not present

## 2022-12-29 DIAGNOSIS — D509 Iron deficiency anemia, unspecified: Secondary | ICD-10-CM | POA: Insufficient documentation

## 2022-12-29 DIAGNOSIS — E1122 Type 2 diabetes mellitus with diabetic chronic kidney disease: Secondary | ICD-10-CM | POA: Insufficient documentation

## 2022-12-29 DIAGNOSIS — D500 Iron deficiency anemia secondary to blood loss (chronic): Secondary | ICD-10-CM

## 2022-12-29 LAB — CMP (CANCER CENTER ONLY)
ALT: 10 U/L (ref 0–44)
AST: 16 U/L (ref 15–41)
Albumin: 4.1 g/dL (ref 3.5–5.0)
Alkaline Phosphatase: 58 U/L (ref 38–126)
Anion gap: 9 (ref 5–15)
BUN: 25 mg/dL — ABNORMAL HIGH (ref 8–23)
CO2: 23 mmol/L (ref 22–32)
Calcium: 9.6 mg/dL (ref 8.9–10.3)
Chloride: 99 mmol/L (ref 98–111)
Creatinine: 1.25 mg/dL — ABNORMAL HIGH (ref 0.61–1.24)
GFR, Estimated: 57 mL/min — ABNORMAL LOW (ref >60)
Glucose, Bld: 119 mg/dL — ABNORMAL HIGH (ref 70–99)
Potassium: 4.9 mmol/L (ref 3.5–5.1)
Sodium: 131 mmol/L — ABNORMAL LOW (ref 135–145)
Total Bilirubin: 0.4 mg/dL (ref 0.3–1.2)
Total Protein: 7 g/dL (ref 6.5–8.1)

## 2022-12-29 LAB — CBC WITH DIFFERENTIAL (CANCER CENTER ONLY)
Abs Immature Granulocytes: 0.04 10*3/uL (ref 0.00–0.07)
Basophils Absolute: 0 10*3/uL (ref 0.0–0.1)
Basophils Relative: 0 %
Eosinophils Absolute: 0.1 10*3/uL (ref 0.0–0.5)
Eosinophils Relative: 1 %
HCT: 36.1 % — ABNORMAL LOW (ref 39.0–52.0)
Hemoglobin: 11.9 g/dL — ABNORMAL LOW (ref 13.0–17.0)
Immature Granulocytes: 0 %
Lymphocytes Relative: 32 %
Lymphs Abs: 3.1 10*3/uL (ref 0.7–4.0)
MCH: 30.4 pg (ref 26.0–34.0)
MCHC: 33 g/dL (ref 30.0–36.0)
MCV: 92.1 fL (ref 80.0–100.0)
Monocytes Absolute: 0.8 10*3/uL (ref 0.1–1.0)
Monocytes Relative: 8 %
Neutro Abs: 5.5 10*3/uL (ref 1.7–7.7)
Neutrophils Relative %: 59 %
Platelet Count: 357 10*3/uL (ref 150–400)
RBC: 3.92 MIL/uL — ABNORMAL LOW (ref 4.22–5.81)
RDW: 13.3 % (ref 11.5–15.5)
WBC Count: 9.6 10*3/uL (ref 4.0–10.5)
nRBC: 0 % (ref 0.0–0.2)

## 2022-12-29 LAB — RETICULOCYTES
Immature Retic Fract: 10.1 % (ref 2.3–15.9)
RBC.: 3.87 MIL/uL — ABNORMAL LOW (ref 4.22–5.81)
Retic Count, Absolute: 52.2 10*3/uL (ref 19.0–186.0)
Retic Ct Pct: 1.4 % (ref 0.4–3.1)

## 2022-12-29 LAB — IRON AND IRON BINDING CAPACITY (CC-WL,HP ONLY)
Iron: 86 ug/dL (ref 45–182)
Saturation Ratios: 28 % (ref 17.9–39.5)
TIBC: 307 ug/dL (ref 250–450)
UIBC: 221 ug/dL (ref 117–376)

## 2022-12-29 LAB — FERRITIN: Ferritin: 159 ng/mL (ref 24–336)

## 2022-12-29 NOTE — Progress Notes (Signed)
Hematology and Oncology Follow Up Visit  Dustin Mccarthy [id number] May 13, 1941 82 y.o. 12/29/2022   Principle Diagnosis:  Iron deficiency Anemia Erythropoietin def Anemia  Current Therapy:   IV iron - Injectafer - given on 02/08/2022  Aranesp 300 mcg sq for Hgb <11 -- dose given 07/13/2021     Interim History:  Dustin Mccarthy is back for a follow-up.  We last saw him back in May.  Since then, he has been doing okay.  He might go to the beach with his family this Summer.  He has been busy.  He is eating okay.  He has had no problems with bowels or bladder.  He has had no cough or shortness of breath.  His last iron studies that were done back in May showed a ferritin of 167 with an iron saturation of 19%.  He has not noted any obvious bleeding.  There is been no leg swelling.  He does have diabetes he had his last hemoglobin A1c was 6.6.  Currently, I would say that his performance status is probably ECOG 1.     Medications:  Current Outpatient Medications:    ACCU-CHEK AVIVA PLUS test strip, 1 strip by Other route 2 (two) times daily. Use 1 strip to check glucose twice a day, Disp: , Rfl: 0   APPLE CIDER VINEGAR PO, Take by mouth. 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 1000 mg daily, Disp: , Rfl:    vitamin B-12 (CYANOCOBALAMIN) 1000 MCG tablet, Take 1,000 mcg by mouth. Every other day, Disp: , Rfl:    Zinc 50 MG TABS, Take 1 tablet by mouth daily at 6 (six) AM., Disp: , Rfl:   Allergies:  Allergies  Allergen Reactions   Bee Venom Anaphylaxis   Hydrochlorothiazide Other (See Comments)    Dizzy   Oxycodone Nausea And Vomiting   Iodinated Contrast Media Rash   Iodine Other (See Comments)    UNKNOWN REACTION   Povidone Iodine Other (See Comments)    UNKNOWN REACTION    Past Medical History, Surgical history, Social history, and Family History were reviewed and  updated.  Review of Systems: Review of Systems  Constitutional:  Positive for fatigue.  HENT:  Negative.    Eyes: Negative.   Respiratory: Negative.    Cardiovascular: Negative.   Gastrointestinal:  Positive for constipation.  Endocrine: Negative.   Genitourinary: Negative.    Musculoskeletal:  Positive for arthralgias and myalgias.  Skin: Negative.   Neurological:  Positive for light-headedness.  Hematological: Negative.   Psychiatric/Behavioral: Negative.      Physical Exam:  height is 5\' 6"  (1.676 m) and weight is 162 lb 12.8 oz (73.8 kg). His oral temperature is 98.7 F (37.1 C). His blood pressure is 142/58 (abnormal) and his pulse is 64. His respiration is 18 and oxygen saturation is 100%.   Wt Readings from Last 3 Encounters:  12/29/22 162 lb 12.8 oz (73.8 kg)  09/29/22 161 lb (73 kg)  09/15/22 164 lb 12.8 oz (74.8 kg)    Physical Exam Vitals reviewed.  HENT:     Head: Normocephalic and  atraumatic.  Eyes:     Pupils: Pupils are equal, round, and reactive to light.  Cardiovascular:     Rate and Rhythm: Normal rate and regular rhythm.     Heart sounds: Normal heart sounds.  Pulmonary:     Effort: Pulmonary effort is normal.     Breath sounds: Normal breath sounds.  Abdominal:     General: Bowel sounds are normal.     Palpations: Abdomen is soft.  Musculoskeletal:        General: No tenderness or deformity. Normal range of motion.     Cervical back: Normal range of motion.  Lymphadenopathy:     Cervical: No cervical adenopathy.  Skin:    General: Skin is warm and dry.     Findings: No erythema or rash.  Neurological:     Mental Status: He is alert and oriented to person, place, and time.  Psychiatric:        Behavior: Behavior normal.        Thought Content: Thought content normal.        Judgment: Judgment normal.      Lab Results  Component Value Date   WBC 9.6 12/29/2022   HGB 11.9 (L) 12/29/2022   HCT 36.1 (L) 12/29/2022   MCV 92.1 12/29/2022   PLT 357 12/29/2022     Chemistry      Component Value Date/Time   NA 131 (L) 12/29/2022 1146   NA 134 (L) 03/31/2014 1432   K 4.9 12/29/2022 1146   K 4.6 03/31/2014 1432   CL 99 12/29/2022 1146   CO2 23 12/29/2022 1146   CO2 23 03/31/2014 1432   BUN 25 (H) 12/29/2022 1146   BUN 17.2 03/31/2014 1432   CREATININE 1.25 (H) 12/29/2022 1146   CREATININE 1.0 03/31/2014 1432      Component Value Date/Time   CALCIUM 9.6 12/29/2022 1146   CALCIUM 9.5 03/31/2014 1432   ALKPHOS 58 12/29/2022 1146   ALKPHOS 63 02/16/2014 1140   AST 16 12/29/2022 1146   AST 15 02/16/2014 1140   ALT 10 12/29/2022 1146   ALT 12 02/16/2014 1140   BILITOT 0.4 12/29/2022 1146   BILITOT 0.38 02/16/2014 1140      Impression and Plan: Dustin Mccarthy is a 82 year old white male.  He has diabetes.  He has mild anemia.  He responded incredibly well to the Aranesp.  He responded well to iron.  He does not need any injections  today.  We will have to  see what his iron studies look like.  I will have to think that they should be okay since his hemoglobin is slowly trending upward.   We will plan to get him back in 3 months.   Dustin Macho, MD 8/2/202412:26 PM

## 2023-01-15 DIAGNOSIS — H26491 Other secondary cataract, right eye: Secondary | ICD-10-CM | POA: Diagnosis not present

## 2023-01-15 DIAGNOSIS — H2512 Age-related nuclear cataract, left eye: Secondary | ICD-10-CM | POA: Diagnosis not present

## 2023-01-15 DIAGNOSIS — Z961 Presence of intraocular lens: Secondary | ICD-10-CM | POA: Diagnosis not present

## 2023-01-15 DIAGNOSIS — E113211 Type 2 diabetes mellitus with mild nonproliferative diabetic retinopathy with macular edema, right eye: Secondary | ICD-10-CM | POA: Diagnosis not present

## 2023-02-05 DIAGNOSIS — I6529 Occlusion and stenosis of unspecified carotid artery: Secondary | ICD-10-CM | POA: Diagnosis not present

## 2023-02-05 DIAGNOSIS — E11319 Type 2 diabetes mellitus with unspecified diabetic retinopathy without macular edema: Secondary | ICD-10-CM | POA: Diagnosis not present

## 2023-02-05 DIAGNOSIS — I10 Essential (primary) hypertension: Secondary | ICD-10-CM | POA: Diagnosis not present

## 2023-02-05 DIAGNOSIS — E785 Hyperlipidemia, unspecified: Secondary | ICD-10-CM | POA: Diagnosis not present

## 2023-02-13 DIAGNOSIS — T466X5A Adverse effect of antihyperlipidemic and antiarteriosclerotic drugs, initial encounter: Secondary | ICD-10-CM | POA: Diagnosis not present

## 2023-02-13 DIAGNOSIS — E785 Hyperlipidemia, unspecified: Secondary | ICD-10-CM | POA: Diagnosis not present

## 2023-02-13 DIAGNOSIS — M791 Myalgia, unspecified site: Secondary | ICD-10-CM | POA: Diagnosis not present

## 2023-02-13 DIAGNOSIS — E11319 Type 2 diabetes mellitus with unspecified diabetic retinopathy without macular edema: Secondary | ICD-10-CM | POA: Diagnosis not present

## 2023-02-13 DIAGNOSIS — I10 Essential (primary) hypertension: Secondary | ICD-10-CM | POA: Diagnosis not present

## 2023-02-13 DIAGNOSIS — Z794 Long term (current) use of insulin: Secondary | ICD-10-CM | POA: Diagnosis not present

## 2023-02-13 DIAGNOSIS — E7841 Elevated Lipoprotein(a): Secondary | ICD-10-CM | POA: Diagnosis not present

## 2023-02-21 DIAGNOSIS — E113311 Type 2 diabetes mellitus with moderate nonproliferative diabetic retinopathy with macular edema, right eye: Secondary | ICD-10-CM | POA: Diagnosis not present

## 2023-03-12 DIAGNOSIS — H35363 Drusen (degenerative) of macula, bilateral: Secondary | ICD-10-CM | POA: Diagnosis not present

## 2023-03-12 DIAGNOSIS — H43391 Other vitreous opacities, right eye: Secondary | ICD-10-CM | POA: Diagnosis not present

## 2023-03-12 DIAGNOSIS — E113211 Type 2 diabetes mellitus with mild nonproliferative diabetic retinopathy with macular edema, right eye: Secondary | ICD-10-CM | POA: Diagnosis not present

## 2023-03-12 DIAGNOSIS — H35033 Hypertensive retinopathy, bilateral: Secondary | ICD-10-CM | POA: Diagnosis not present

## 2023-03-12 DIAGNOSIS — E113292 Type 2 diabetes mellitus with mild nonproliferative diabetic retinopathy without macular edema, left eye: Secondary | ICD-10-CM | POA: Diagnosis not present

## 2023-03-12 DIAGNOSIS — H353131 Nonexudative age-related macular degeneration, bilateral, early dry stage: Secondary | ICD-10-CM | POA: Diagnosis not present

## 2023-03-30 ENCOUNTER — Encounter: Payer: Self-pay | Admitting: *Deleted

## 2023-03-30 ENCOUNTER — Inpatient Hospital Stay (HOSPITAL_BASED_OUTPATIENT_CLINIC_OR_DEPARTMENT_OTHER): Payer: Medicare Other | Admitting: Hematology & Oncology

## 2023-03-30 ENCOUNTER — Encounter: Payer: Self-pay | Admitting: Hematology & Oncology

## 2023-03-30 ENCOUNTER — Inpatient Hospital Stay: Payer: Medicare Other

## 2023-03-30 ENCOUNTER — Inpatient Hospital Stay: Payer: Medicare Other | Attending: Hematology & Oncology

## 2023-03-30 ENCOUNTER — Other Ambulatory Visit: Payer: Self-pay

## 2023-03-30 VITALS — BP 136/63 | HR 62 | Temp 98.2°F | Resp 18 | Ht 66.0 in | Wt 163.4 lb

## 2023-03-30 DIAGNOSIS — D509 Iron deficiency anemia, unspecified: Secondary | ICD-10-CM | POA: Insufficient documentation

## 2023-03-30 DIAGNOSIS — D500 Iron deficiency anemia secondary to blood loss (chronic): Secondary | ICD-10-CM | POA: Diagnosis not present

## 2023-03-30 DIAGNOSIS — E1122 Type 2 diabetes mellitus with diabetic chronic kidney disease: Secondary | ICD-10-CM | POA: Diagnosis not present

## 2023-03-30 DIAGNOSIS — N189 Chronic kidney disease, unspecified: Secondary | ICD-10-CM | POA: Diagnosis not present

## 2023-03-30 DIAGNOSIS — D631 Anemia in chronic kidney disease: Secondary | ICD-10-CM | POA: Insufficient documentation

## 2023-03-30 LAB — CBC WITH DIFFERENTIAL (CANCER CENTER ONLY)
Abs Immature Granulocytes: 0.05 10*3/uL (ref 0.00–0.07)
Basophils Absolute: 0.1 10*3/uL (ref 0.0–0.1)
Basophils Relative: 1 %
Eosinophils Absolute: 0.1 10*3/uL (ref 0.0–0.5)
Eosinophils Relative: 1 %
HCT: 36.1 % — ABNORMAL LOW (ref 39.0–52.0)
Hemoglobin: 11.7 g/dL — ABNORMAL LOW (ref 13.0–17.0)
Immature Granulocytes: 1 %
Lymphocytes Relative: 27 %
Lymphs Abs: 3 10*3/uL (ref 0.7–4.0)
MCH: 29.5 pg (ref 26.0–34.0)
MCHC: 32.4 g/dL (ref 30.0–36.0)
MCV: 90.9 fL (ref 80.0–100.0)
Monocytes Absolute: 0.9 10*3/uL (ref 0.1–1.0)
Monocytes Relative: 9 %
Neutro Abs: 6.9 10*3/uL (ref 1.7–7.7)
Neutrophils Relative %: 61 %
Platelet Count: 438 10*3/uL — ABNORMAL HIGH (ref 150–400)
RBC: 3.97 MIL/uL — ABNORMAL LOW (ref 4.22–5.81)
RDW: 13.4 % (ref 11.5–15.5)
WBC Count: 11.1 10*3/uL — ABNORMAL HIGH (ref 4.0–10.5)
nRBC: 0 % (ref 0.0–0.2)

## 2023-03-30 LAB — IRON AND IRON BINDING CAPACITY (CC-WL,HP ONLY)
Iron: 59 ug/dL (ref 45–182)
Saturation Ratios: 18 % (ref 17.9–39.5)
TIBC: 328 ug/dL (ref 250–450)
UIBC: 269 ug/dL (ref 117–376)

## 2023-03-30 LAB — CMP (CANCER CENTER ONLY)
ALT: 10 U/L (ref 0–44)
AST: 16 U/L (ref 15–41)
Albumin: 4.4 g/dL (ref 3.5–5.0)
Alkaline Phosphatase: 72 U/L (ref 38–126)
Anion gap: 11 (ref 5–15)
BUN: 21 mg/dL (ref 8–23)
CO2: 23 mmol/L (ref 22–32)
Calcium: 10.1 mg/dL (ref 8.9–10.3)
Chloride: 100 mmol/L (ref 98–111)
Creatinine: 1.17 mg/dL (ref 0.61–1.24)
GFR, Estimated: >60 mL/min (ref >60)
Glucose, Bld: 113 mg/dL — ABNORMAL HIGH (ref 70–99)
Potassium: 4.9 mmol/L (ref 3.5–5.1)
Sodium: 134 mmol/L — ABNORMAL LOW (ref 135–145)
Total Bilirubin: 0.3 mg/dL (ref 0.3–1.2)
Total Protein: 7.3 g/dL (ref 6.5–8.1)

## 2023-03-30 LAB — FERRITIN: Ferritin: 127 ng/mL (ref 24–336)

## 2023-03-30 LAB — RETICULOCYTES
Immature Retic Fract: 8.4 % (ref 2.3–15.9)
RBC.: 3.95 MIL/uL — ABNORMAL LOW (ref 4.22–5.81)
Retic Count, Absolute: 67.2 10*3/uL (ref 19.0–186.0)
Retic Ct Pct: 1.7 % (ref 0.4–3.1)

## 2023-03-30 NOTE — Progress Notes (Signed)
Hematology and Oncology Follow Up Visit  Dustin Mccarthy [id number] 09-24-1940 82 y.o. 03/30/2023   Principle Diagnosis:  Iron deficiency Anemia Erythropoietin def Anemia  Current Therapy:   IV iron - Injectafer - given on 02/08/2022  Aranesp 300 mcg sq for Hgb <11 -- dose given 07/13/2021     Interim History:  Dustin Mccarthy is back for a follow-up.  We last saw him back in August.  Since then, he has been doing pretty well.  He is having a lot of problems with his knees.  He says that his son knows a orthopedist that [facility].  If that does not work, we can was give him to Dr. Jerl Santos.  He has had no obvious bleeding.  He has not no problems with nausea or vomiting.  His appetite has been doing pretty well.  His last iron studies that were done back in August showed a ferritin of 159 with an iron saturation of 28%.  He has had no problems with fever.  He has had no issues with COVID.  Currently, I would have said that his performance status is probably ECOG 1.    Medications:  Current Outpatient Medications:    ACCU-CHEK AVIVA PLUS test strip, 1 strip by Other route 2 (two) times daily. Use 1 strip to check glucose twice a day, Disp: , Rfl: 0   APPLE CIDER VINEGAR PO, Take by mouth. 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 1000 mg daily, Disp: , Rfl:    vitamin B-12 (CYANOCOBALAMIN) 1000 MCG tablet, Take 1,000 mcg by mouth. Every other day, Disp: , Rfl:    Zinc 50 MG TABS, Take 1 tablet by mouth daily at 6 (six) AM., Disp: , Rfl:   Allergies:  Allergies  Allergen Reactions   Bee Venom Anaphylaxis   Hydrochlorothiazide Other (See Comments)    Dizzy   Iodinated Contrast Media Rash   Iodine Rash   Oxycodone Nausea And Vomiting   Povidone Iodine Other (See Comments)    UNKNOWN REACTION    Past Medical History, Surgical history, Social history, and Family History  were reviewed and updated.  Review of Systems: Review of Systems  Constitutional:  Positive for fatigue.  HENT:  Negative.    Eyes: Negative.   Respiratory: Negative.    Cardiovascular: Negative.   Gastrointestinal:  Positive for constipation.  Endocrine: Negative.   Genitourinary: Negative.    Musculoskeletal:  Positive for arthralgias and myalgias.  Skin: Negative.   Neurological:  Positive for light-headedness.  Hematological: Negative.   Psychiatric/Behavioral: Negative.      Physical Exam:  height is 5\' 6"  (1.676 m) and weight is 163 lb 6.4 oz (74.1 kg). His oral temperature is 98.2 F (36.8 C). His blood pressure is 136/63 and his pulse is 62. His respiration is 18 and oxygen saturation is 100%.   Wt Readings from Last 3 Encounters:  03/30/23 163 lb 6.4 oz (74.1 kg)  12/29/22 162 lb 12.8 oz (73.8 kg)  09/29/22 161 lb (73 kg)    Physical Exam Vitals reviewed.  HENT:     Head:  Normocephalic and atraumatic.  Eyes:     Pupils: Pupils are equal, round, and reactive to light.  Cardiovascular:     Rate and Rhythm: Normal rate and regular rhythm.     Heart sounds: Normal heart sounds.  Pulmonary:     Effort: Pulmonary effort is normal.     Breath sounds: Normal breath sounds.  Abdominal:     General: Bowel sounds are normal.     Palpations: Abdomen is soft.  Musculoskeletal:        General: No tenderness or deformity. Normal range of motion.     Cervical back: Normal range of motion.  Lymphadenopathy:     Cervical: No cervical adenopathy.  Skin:    General: Skin is warm and dry.     Findings: No erythema or rash.  Neurological:     Mental Status: He is alert and oriented to person, place, and time.  Psychiatric:        Behavior: Behavior normal.        Thought Content: Thought content normal.        Judgment: Judgment normal.      Lab Results  Component Value Date   WBC 9.6 12/29/2022   HGB 11.9 (L) 12/29/2022   HCT 36.1 (L) 12/29/2022   MCV 92.1 12/29/2022   PLT 357 12/29/2022     Chemistry      Component Value Date/Time   NA 131 (L) 12/29/2022 1146   NA 134 (L) 03/31/2014 1432   K 4.9 12/29/2022 1146   K 4.6 03/31/2014 1432   CL 99 12/29/2022 1146   CO2 23 12/29/2022 1146   CO2 23 03/31/2014 1432   BUN 25 (H) 12/29/2022 1146   BUN 17.2 03/31/2014 1432   CREATININE 1.25 (H) 12/29/2022 1146   CREATININE 1.0 03/31/2014 1432      Component Value Date/Time   CALCIUM 9.6 12/29/2022 1146   CALCIUM 9.5 03/31/2014 1432   ALKPHOS 58 12/29/2022 1146   ALKPHOS 63 02/16/2014 1140   AST 16 12/29/2022 1146   AST 15 02/16/2014 1140   ALT 10 12/29/2022 1146   ALT 12 02/16/2014 1140   BILITOT 0.4 12/29/2022 1146   BILITOT 0.38 02/16/2014 1140      Impression and Plan: Dustin Mccarthy is a 82 year old white male.  He has diabetes.  He has mild anemia.  His hemoglobin is doing quite well today.  He does not need Aranesp.  I would be shocked if he  needed any iron.  We will see what his iron levels are.  We will get him back after the Holidays.  I know it is still tough on him since his wife passed on.  Thankfully, he has a very good family to help him.  Josph Macho, MD 11/1/202412:27 PM

## 2023-04-11 DIAGNOSIS — R3912 Poor urinary stream: Secondary | ICD-10-CM | POA: Diagnosis not present

## 2023-04-11 DIAGNOSIS — R351 Nocturia: Secondary | ICD-10-CM | POA: Diagnosis not present

## 2023-04-11 DIAGNOSIS — N401 Enlarged prostate with lower urinary tract symptoms: Secondary | ICD-10-CM | POA: Diagnosis not present

## 2023-04-11 DIAGNOSIS — R3915 Urgency of urination: Secondary | ICD-10-CM | POA: Diagnosis not present

## 2023-04-18 DIAGNOSIS — E113311 Type 2 diabetes mellitus with moderate nonproliferative diabetic retinopathy with macular edema, right eye: Secondary | ICD-10-CM | POA: Diagnosis not present

## 2023-04-23 DIAGNOSIS — M170 Bilateral primary osteoarthritis of knee: Secondary | ICD-10-CM | POA: Diagnosis not present

## 2023-04-23 DIAGNOSIS — M25562 Pain in left knee: Secondary | ICD-10-CM | POA: Diagnosis not present

## 2023-04-23 DIAGNOSIS — M25561 Pain in right knee: Secondary | ICD-10-CM | POA: Diagnosis not present

## 2023-05-09 DIAGNOSIS — L570 Actinic keratosis: Secondary | ICD-10-CM | POA: Diagnosis not present

## 2023-05-09 DIAGNOSIS — L812 Freckles: Secondary | ICD-10-CM | POA: Diagnosis not present

## 2023-05-09 DIAGNOSIS — L821 Other seborrheic keratosis: Secondary | ICD-10-CM | POA: Diagnosis not present

## 2023-05-09 DIAGNOSIS — Z85820 Personal history of malignant melanoma of skin: Secondary | ICD-10-CM | POA: Diagnosis not present

## 2023-05-09 DIAGNOSIS — Z85828 Personal history of other malignant neoplasm of skin: Secondary | ICD-10-CM | POA: Diagnosis not present

## 2023-05-09 DIAGNOSIS — D1801 Hemangioma of skin and subcutaneous tissue: Secondary | ICD-10-CM | POA: Diagnosis not present

## 2023-05-10 DIAGNOSIS — I10 Essential (primary) hypertension: Secondary | ICD-10-CM | POA: Diagnosis not present

## 2023-05-10 DIAGNOSIS — E7841 Elevated Lipoprotein(a): Secondary | ICD-10-CM | POA: Diagnosis not present

## 2023-05-10 DIAGNOSIS — E785 Hyperlipidemia, unspecified: Secondary | ICD-10-CM | POA: Diagnosis not present

## 2023-05-10 DIAGNOSIS — E11319 Type 2 diabetes mellitus with unspecified diabetic retinopathy without macular edema: Secondary | ICD-10-CM | POA: Diagnosis not present

## 2023-05-10 DIAGNOSIS — T466X5A Adverse effect of antihyperlipidemic and antiarteriosclerotic drugs, initial encounter: Secondary | ICD-10-CM | POA: Diagnosis not present

## 2023-05-10 DIAGNOSIS — M791 Myalgia, unspecified site: Secondary | ICD-10-CM | POA: Diagnosis not present

## 2023-05-10 DIAGNOSIS — Z794 Long term (current) use of insulin: Secondary | ICD-10-CM | POA: Diagnosis not present

## 2023-06-13 DIAGNOSIS — E113311 Type 2 diabetes mellitus with moderate nonproliferative diabetic retinopathy with macular edema, right eye: Secondary | ICD-10-CM | POA: Diagnosis not present

## 2023-06-29 ENCOUNTER — Inpatient Hospital Stay: Payer: Medicare Other

## 2023-06-29 ENCOUNTER — Inpatient Hospital Stay (HOSPITAL_BASED_OUTPATIENT_CLINIC_OR_DEPARTMENT_OTHER): Payer: Medicare Other | Admitting: Hematology & Oncology

## 2023-06-29 ENCOUNTER — Inpatient Hospital Stay: Payer: Medicare Other | Attending: Hematology & Oncology

## 2023-06-29 ENCOUNTER — Encounter: Payer: Self-pay | Admitting: Hematology & Oncology

## 2023-06-29 VITALS — BP 143/65 | HR 62 | Temp 98.0°F | Resp 18 | Ht 66.0 in | Wt 162.4 lb

## 2023-06-29 DIAGNOSIS — E1122 Type 2 diabetes mellitus with diabetic chronic kidney disease: Secondary | ICD-10-CM | POA: Diagnosis not present

## 2023-06-29 DIAGNOSIS — D631 Anemia in chronic kidney disease: Secondary | ICD-10-CM

## 2023-06-29 DIAGNOSIS — D509 Iron deficiency anemia, unspecified: Secondary | ICD-10-CM | POA: Diagnosis not present

## 2023-06-29 DIAGNOSIS — D500 Iron deficiency anemia secondary to blood loss (chronic): Secondary | ICD-10-CM | POA: Diagnosis not present

## 2023-06-29 DIAGNOSIS — N189 Chronic kidney disease, unspecified: Secondary | ICD-10-CM | POA: Insufficient documentation

## 2023-06-29 LAB — CBC WITH DIFFERENTIAL (CANCER CENTER ONLY)
Abs Immature Granulocytes: 0.06 10*3/uL (ref 0.00–0.07)
Basophils Absolute: 0.1 10*3/uL (ref 0.0–0.1)
Basophils Relative: 0 %
Eosinophils Absolute: 0.1 10*3/uL (ref 0.0–0.5)
Eosinophils Relative: 1 %
HCT: 34.8 % — ABNORMAL LOW (ref 39.0–52.0)
Hemoglobin: 11.5 g/dL — ABNORMAL LOW (ref 13.0–17.0)
Immature Granulocytes: 1 %
Lymphocytes Relative: 35 %
Lymphs Abs: 4.2 10*3/uL — ABNORMAL HIGH (ref 0.7–4.0)
MCH: 30.1 pg (ref 26.0–34.0)
MCHC: 33 g/dL (ref 30.0–36.0)
MCV: 91.1 fL (ref 80.0–100.0)
Monocytes Absolute: 1.1 10*3/uL — ABNORMAL HIGH (ref 0.1–1.0)
Monocytes Relative: 9 %
Neutro Abs: 6.4 10*3/uL (ref 1.7–7.7)
Neutrophils Relative %: 54 %
Platelet Count: 392 10*3/uL (ref 150–400)
RBC: 3.82 MIL/uL — ABNORMAL LOW (ref 4.22–5.81)
RDW: 14.3 % (ref 11.5–15.5)
WBC Count: 11.9 10*3/uL — ABNORMAL HIGH (ref 4.0–10.5)
nRBC: 0 % (ref 0.0–0.2)

## 2023-06-29 LAB — CMP (CANCER CENTER ONLY)
ALT: 10 U/L (ref 0–44)
AST: 16 U/L (ref 15–41)
Albumin: 4 g/dL (ref 3.5–5.0)
Alkaline Phosphatase: 58 U/L (ref 38–126)
Anion gap: 10 (ref 5–15)
BUN: 22 mg/dL (ref 8–23)
CO2: 23 mmol/L (ref 22–32)
Calcium: 9.1 mg/dL (ref 8.9–10.3)
Chloride: 98 mmol/L (ref 98–111)
Creatinine: 1.12 mg/dL (ref 0.61–1.24)
GFR, Estimated: >60 mL/min (ref >60)
Glucose, Bld: 139 mg/dL — ABNORMAL HIGH (ref 70–99)
Potassium: 4.7 mmol/L (ref 3.5–5.1)
Sodium: 131 mmol/L — ABNORMAL LOW (ref 135–145)
Total Bilirubin: 0.4 mg/dL (ref 0.0–1.2)
Total Protein: 6.8 g/dL (ref 6.5–8.1)

## 2023-06-29 LAB — IRON AND IRON BINDING CAPACITY (CC-WL,HP ONLY)
Iron: 75 ug/dL (ref 45–182)
Saturation Ratios: 24 % (ref 17.9–39.5)
TIBC: 316 ug/dL (ref 250–450)
UIBC: 241 ug/dL (ref 117–376)

## 2023-06-29 LAB — RETICULOCYTES
Immature Retic Fract: 15.6 % (ref 2.3–15.9)
RBC.: 3.68 MIL/uL — ABNORMAL LOW (ref 4.22–5.81)
Retic Count, Absolute: 49.3 10*3/uL (ref 19.0–186.0)
Retic Ct Pct: 1.3 % (ref 0.4–3.1)

## 2023-06-29 LAB — FERRITIN: Ferritin: 123 ng/mL (ref 24–336)

## 2023-06-29 NOTE — Progress Notes (Signed)
Hematology and Oncology Follow Up Visit  Dustin Mccarthy [id number] 1941-05-18 83 y.o. 06/29/2023   Principle Diagnosis:  Iron deficiency Anemia Erythropoietin def Anemia  Current Therapy:   IV iron - Injectafer - given on 02/08/2022  Aranesp 300 mcg sq for Hgb <11 -- dose given 07/13/2021     Interim History:  Dustin Mccarthy is back for a follow-up.  We last saw him back in November.  Since then, has been doing okay.  He does not complain too much of his knees.  Hopefully, he will be able to do okay with his knees and with any arthritis.  His last iron studies that we did showed that his iron was okay.  His last iron studies showed a ferritin of 127 with iron saturation of 15%.  His appetite is okay.  He has had no problems with bleeding.  Has been no change in bowel or bladder habits.  He has had no rashes.  There is been no leg swelling.  He was able to enjoy the Holiday season.  Overall, I would say that his performance status probably ECOG 1.     Medications:  Current Outpatient Medications:    ACCU-CHEK AVIVA PLUS test strip, 1 strip by Other route 2 (two) times daily. Use 1 strip to check glucose twice a day, Disp: , Rfl: 0   APPLE CIDER VINEGAR PO, Take by mouth. 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 1000 mg daily, Disp: , Rfl:    vitamin B-12 (CYANOCOBALAMIN) 1000 MCG tablet, Take 1,000 mcg by mouth. Every other day, Disp: , Rfl:    Zinc 50 MG TABS, Take 1 tablet by mouth daily at 6 (six) AM., Disp: , Rfl:   Allergies:  Allergies  Allergen Reactions   Bee Venom Anaphylaxis   Hydrochlorothiazide Other (See Comments)    Dizzy   Iodinated Contrast Media Rash   Iodine Rash   Oxycodone Nausea And Vomiting   Povidone Iodine Other (See Comments)    UNKNOWN REACTION    Past Medical History, Surgical history, Social history, and Family History were reviewed  and updated.  Review of Systems: Review of Systems  Constitutional:  Positive for fatigue.  HENT:  Negative.    Eyes: Negative.   Respiratory: Negative.    Cardiovascular: Negative.   Gastrointestinal:  Positive for constipation.  Endocrine: Negative.   Genitourinary: Negative.    Musculoskeletal:  Positive for arthralgias and myalgias.  Skin: Negative.   Neurological:  Positive for light-headedness.  Hematological: Negative.   Psychiatric/Behavioral: Negative.      Physical Exam:  height is 5\' 6"  (1.676 m) and weight is 162 lb 6.4 oz (73.7 kg). His oral temperature is 98 F (36.7 C). His blood pressure is 143/65 (abnormal) and his pulse is 62. His respiration is 18 and oxygen saturation is 100%.   Wt Readings from Last 3 Encounters:  06/29/23 162 lb 6.4 oz (73.7 kg)  03/30/23 163 lb 6.4 oz (74.1 kg)  12/29/22 162 lb 12.8 oz (73.8 kg)    Physical Exam Vitals reviewed.  HENT:     Head:  Normocephalic and atraumatic.  Eyes:     Pupils: Pupils are equal, round, and reactive to light.  Cardiovascular:     Rate and Rhythm: Normal rate and regular rhythm.     Heart sounds: Normal heart sounds.  Pulmonary:     Effort: Pulmonary effort is normal.     Breath sounds: Normal breath sounds.  Abdominal:     General: Bowel sounds are normal.     Palpations: Abdomen is soft.  Musculoskeletal:        General: No tenderness or deformity. Normal range of motion.     Cervical back: Normal range of motion.  Lymphadenopathy:     Cervical: No cervical adenopathy.  Skin:    General: Skin is warm and dry.     Findings: No erythema or rash.  Neurological:     Mental Status: He is alert and oriented to person, place, and time.  Psychiatric:        Behavior: Behavior normal.        Thought Content: Thought content normal.        Judgment: Judgment normal.     Lab Results  Component Value Date   WBC 11.9 (H) 06/29/2023   HGB 11.5 (L) 06/29/2023   HCT 34.8 (L) 06/29/2023   MCV 91.1 06/29/2023   PLT 392 06/29/2023     Chemistry      Component Value Date/Time   NA 131 (L) 06/29/2023 1152   NA 134 (L) 03/31/2014 1432   K 4.7 06/29/2023 1152   K 4.6 03/31/2014 1432   CL 98 06/29/2023 1152   CO2 23 06/29/2023 1152   CO2 23 03/31/2014 1432   BUN 22 06/29/2023 1152   BUN 17.2 03/31/2014 1432   CREATININE 1.12 06/29/2023 1152   CREATININE 1.0 03/31/2014 1432      Component Value Date/Time   CALCIUM 9.1 06/29/2023 1152   CALCIUM 9.5 03/31/2014 1432   ALKPHOS 58 06/29/2023 1152   ALKPHOS 63 02/16/2014 1140   AST 16 06/29/2023 1152   AST 15 02/16/2014 1140   ALT 10 06/29/2023 1152   ALT 12 02/16/2014 1140   BILITOT 0.4 06/29/2023 1152   BILITOT 0.38 02/16/2014 1140      Impression and Plan: Dustin Mccarthy is a 83 year old white male.  He has diabetes.  He has mild anemia.  His hemoglobin is doing quite well today.  He does not need Aranesp.  I would be shocked if he needed  any iron.  We  will see what his iron levels are.  At this point, I think we can probably get him back in about 2-3 months.  We will try to get him through the Winter.  Marland Kitchen  Dustin Macho, MD 1/31/202512:46 PM

## 2023-07-02 ENCOUNTER — Encounter: Payer: Self-pay | Admitting: *Deleted

## 2023-07-23 DIAGNOSIS — H60311 Diffuse otitis externa, right ear: Secondary | ICD-10-CM | POA: Diagnosis not present

## 2023-07-23 DIAGNOSIS — H9201 Otalgia, right ear: Secondary | ICD-10-CM | POA: Diagnosis not present

## 2023-08-02 DIAGNOSIS — H60501 Unspecified acute noninfective otitis externa, right ear: Secondary | ICD-10-CM | POA: Diagnosis not present

## 2023-08-02 DIAGNOSIS — H9201 Otalgia, right ear: Secondary | ICD-10-CM | POA: Diagnosis not present

## 2023-08-08 DIAGNOSIS — R42 Dizziness and giddiness: Secondary | ICD-10-CM | POA: Diagnosis not present

## 2023-08-08 DIAGNOSIS — H9201 Otalgia, right ear: Secondary | ICD-10-CM | POA: Diagnosis not present

## 2023-08-08 DIAGNOSIS — H60501 Unspecified acute noninfective otitis externa, right ear: Secondary | ICD-10-CM | POA: Diagnosis not present

## 2023-08-09 DIAGNOSIS — D649 Anemia, unspecified: Secondary | ICD-10-CM | POA: Diagnosis not present

## 2023-08-09 DIAGNOSIS — I10 Essential (primary) hypertension: Secondary | ICD-10-CM | POA: Diagnosis not present

## 2023-08-09 DIAGNOSIS — E11319 Type 2 diabetes mellitus with unspecified diabetic retinopathy without macular edema: Secondary | ICD-10-CM | POA: Diagnosis not present

## 2023-08-14 DIAGNOSIS — R609 Edema, unspecified: Secondary | ICD-10-CM | POA: Diagnosis not present

## 2023-08-14 DIAGNOSIS — H60311 Diffuse otitis externa, right ear: Secondary | ICD-10-CM | POA: Diagnosis not present

## 2023-08-14 DIAGNOSIS — I2510 Atherosclerotic heart disease of native coronary artery without angina pectoris: Secondary | ICD-10-CM | POA: Diagnosis not present

## 2023-08-14 DIAGNOSIS — D519 Vitamin B12 deficiency anemia, unspecified: Secondary | ICD-10-CM | POA: Diagnosis not present

## 2023-08-14 DIAGNOSIS — E222 Syndrome of inappropriate secretion of antidiuretic hormone: Secondary | ICD-10-CM | POA: Diagnosis not present

## 2023-08-14 DIAGNOSIS — R2689 Other abnormalities of gait and mobility: Secondary | ICD-10-CM | POA: Diagnosis not present

## 2023-08-14 DIAGNOSIS — I6529 Occlusion and stenosis of unspecified carotid artery: Secondary | ICD-10-CM | POA: Diagnosis not present

## 2023-08-14 DIAGNOSIS — I10 Essential (primary) hypertension: Secondary | ICD-10-CM | POA: Diagnosis not present

## 2023-08-14 DIAGNOSIS — Z0000 Encounter for general adult medical examination without abnormal findings: Secondary | ICD-10-CM | POA: Diagnosis not present

## 2023-08-14 DIAGNOSIS — N401 Enlarged prostate with lower urinary tract symptoms: Secondary | ICD-10-CM | POA: Diagnosis not present

## 2023-08-14 DIAGNOSIS — E1139 Type 2 diabetes mellitus with other diabetic ophthalmic complication: Secondary | ICD-10-CM | POA: Diagnosis not present

## 2023-08-15 DIAGNOSIS — J329 Chronic sinusitis, unspecified: Secondary | ICD-10-CM | POA: Diagnosis not present

## 2023-08-15 DIAGNOSIS — G4700 Insomnia, unspecified: Secondary | ICD-10-CM | POA: Diagnosis not present

## 2023-08-15 DIAGNOSIS — H7091 Unspecified mastoiditis, right ear: Secondary | ICD-10-CM | POA: Diagnosis not present

## 2023-08-15 DIAGNOSIS — H9201 Otalgia, right ear: Secondary | ICD-10-CM | POA: Diagnosis not present

## 2023-08-15 DIAGNOSIS — M799 Soft tissue disorder, unspecified: Secondary | ICD-10-CM | POA: Diagnosis not present

## 2023-08-15 DIAGNOSIS — J342 Deviated nasal septum: Secondary | ICD-10-CM | POA: Diagnosis not present

## 2023-08-15 DIAGNOSIS — H60501 Unspecified acute noninfective otitis externa, right ear: Secondary | ICD-10-CM | POA: Diagnosis not present

## 2023-08-22 DIAGNOSIS — H60501 Unspecified acute noninfective otitis externa, right ear: Secondary | ICD-10-CM | POA: Diagnosis not present

## 2023-08-22 DIAGNOSIS — L929 Granulomatous disorder of the skin and subcutaneous tissue, unspecified: Secondary | ICD-10-CM | POA: Diagnosis not present

## 2023-08-29 DIAGNOSIS — L929 Granulomatous disorder of the skin and subcutaneous tissue, unspecified: Secondary | ICD-10-CM | POA: Diagnosis not present

## 2023-08-29 DIAGNOSIS — H60501 Unspecified acute noninfective otitis externa, right ear: Secondary | ICD-10-CM | POA: Diagnosis not present

## 2023-09-03 DIAGNOSIS — E113311 Type 2 diabetes mellitus with moderate nonproliferative diabetic retinopathy with macular edema, right eye: Secondary | ICD-10-CM | POA: Diagnosis not present

## 2023-09-14 ENCOUNTER — Inpatient Hospital Stay: Payer: Medicare Other | Attending: Hematology & Oncology

## 2023-09-14 ENCOUNTER — Encounter: Payer: Self-pay | Admitting: Hematology & Oncology

## 2023-09-14 ENCOUNTER — Other Ambulatory Visit: Payer: Self-pay

## 2023-09-14 ENCOUNTER — Inpatient Hospital Stay: Payer: Medicare Other | Admitting: Hematology & Oncology

## 2023-09-14 ENCOUNTER — Inpatient Hospital Stay: Payer: Medicare Other

## 2023-09-14 VITALS — BP 150/75 | HR 63 | Temp 97.9°F | Resp 16 | Ht 66.0 in | Wt 163.0 lb

## 2023-09-14 DIAGNOSIS — D500 Iron deficiency anemia secondary to blood loss (chronic): Secondary | ICD-10-CM

## 2023-09-14 DIAGNOSIS — D631 Anemia in chronic kidney disease: Secondary | ICD-10-CM | POA: Diagnosis not present

## 2023-09-14 DIAGNOSIS — N189 Chronic kidney disease, unspecified: Secondary | ICD-10-CM | POA: Insufficient documentation

## 2023-09-14 DIAGNOSIS — E1122 Type 2 diabetes mellitus with diabetic chronic kidney disease: Secondary | ICD-10-CM | POA: Diagnosis not present

## 2023-09-14 LAB — IRON AND IRON BINDING CAPACITY (CC-WL,HP ONLY)
Iron: 72 ug/dL (ref 45–182)
Saturation Ratios: 23 % (ref 17.9–39.5)
TIBC: 315 ug/dL (ref 250–450)
UIBC: 243 ug/dL (ref 117–376)

## 2023-09-14 LAB — CBC WITH DIFFERENTIAL (CANCER CENTER ONLY)
Abs Immature Granulocytes: 0.05 10*3/uL (ref 0.00–0.07)
Basophils Absolute: 0.1 10*3/uL (ref 0.0–0.1)
Basophils Relative: 1 %
Eosinophils Absolute: 0.1 10*3/uL (ref 0.0–0.5)
Eosinophils Relative: 1 %
HCT: 32.9 % — ABNORMAL LOW (ref 39.0–52.0)
Hemoglobin: 10.9 g/dL — ABNORMAL LOW (ref 13.0–17.0)
Immature Granulocytes: 1 %
Lymphocytes Relative: 26 %
Lymphs Abs: 2.6 10*3/uL (ref 0.7–4.0)
MCH: 30.3 pg (ref 26.0–34.0)
MCHC: 33.1 g/dL (ref 30.0–36.0)
MCV: 91.4 fL (ref 80.0–100.0)
Monocytes Absolute: 0.9 10*3/uL (ref 0.1–1.0)
Monocytes Relative: 9 %
Neutro Abs: 6.4 10*3/uL (ref 1.7–7.7)
Neutrophils Relative %: 62 %
Platelet Count: 358 10*3/uL (ref 150–400)
RBC: 3.6 MIL/uL — ABNORMAL LOW (ref 4.22–5.81)
RDW: 13.4 % (ref 11.5–15.5)
WBC Count: 10.1 10*3/uL (ref 4.0–10.5)
nRBC: 0 % (ref 0.0–0.2)

## 2023-09-14 LAB — CMP (CANCER CENTER ONLY)
ALT: 11 U/L (ref 0–44)
AST: 17 U/L (ref 15–41)
Albumin: 4 g/dL (ref 3.5–5.0)
Alkaline Phosphatase: 53 U/L (ref 38–126)
Anion gap: 9 (ref 5–15)
BUN: 25 mg/dL — ABNORMAL HIGH (ref 8–23)
CO2: 24 mmol/L (ref 22–32)
Calcium: 9.7 mg/dL (ref 8.9–10.3)
Chloride: 100 mmol/L (ref 98–111)
Creatinine: 1.17 mg/dL (ref 0.61–1.24)
GFR, Estimated: >60 mL/min (ref >60)
Glucose, Bld: 104 mg/dL — ABNORMAL HIGH (ref 70–99)
Potassium: 4.9 mmol/L (ref 3.5–5.1)
Sodium: 133 mmol/L — ABNORMAL LOW (ref 135–145)
Total Bilirubin: 0.3 mg/dL (ref 0.0–1.2)
Total Protein: 6.4 g/dL — ABNORMAL LOW (ref 6.5–8.1)

## 2023-09-14 LAB — RETICULOCYTES
Immature Retic Fract: 14.1 % (ref 2.3–15.9)
RBC.: 3.6 MIL/uL — ABNORMAL LOW (ref 4.22–5.81)
Retic Count, Absolute: 48.2 10*3/uL (ref 19.0–186.0)
Retic Ct Pct: 1.3 % (ref 0.4–3.1)

## 2023-09-14 LAB — FERRITIN: Ferritin: 98 ng/mL (ref 24–336)

## 2023-09-14 MED ORDER — FUROSEMIDE 20 MG PO TABS: 20.0000 mg | ORAL_TABLET | Freq: Every day | ORAL | 4 refills | Status: DC

## 2023-09-14 MED ORDER — DARBEPOETIN ALFA 300 MCG/0.6ML IJ SOSY
300.0000 ug | PREFILLED_SYRINGE | Freq: Once | INTRAMUSCULAR | Status: AC
  Administered 2023-09-14: 300 ug via SUBCUTANEOUS
  Filled 2023-09-14: qty 0.6

## 2023-09-14 NOTE — Patient Instructions (Signed)

## 2023-09-14 NOTE — Progress Notes (Signed)
 Hematology and Oncology Follow Up Visit  Dustin Mccarthy [id number] 1941-01-21 83 y.o. 09/14/2023   Principle Diagnosis:  Iron deficiency Anemia Erythropoietin  def Anemia  Current Therapy:   IV iron - Injectafer  - given on 02/08/2022  Aranesp  300 mcg sq for Hgb <11 -- dose given 09/13/2021     Interim History:  Dustin Mccarthy is back for a follow-up.  So far, things are going pretty well for him.  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  He does have a little swelling in the lower legs.  He is not on a diuretic.  I think he probably would benefit from a diuretic.  I will put him on some Maxide.  He has had no bleeding.  Less than that we saw him, his ferritin was 123 with an iron saturation of 24%.  He has had no fever.  He has had a bad ear infection in the right ear.  He said he about a month for this to get better.  He has had no issues with rashes.  He has had no obvious change in bowel or bladder habits.  His appetite has been pretty good.  He and his [place].  Overall, I would say that his performance status is probably ECOG 1.   Medications:  Current Outpatient Medications:    ACCU-CHEK AVIVA PLUS test strip, 1 strip by Other route 2 (two) times daily. Use 1 strip to check glucose twice a day, Disp: , Rfl: 0   APPLE CIDER VINEGAR PO, Take by mouth. 250 mg daily, Disp: , Rfl:    aspirin 81 MG chewable tablet, every other day., Disp: , Rfl:    Cholecalciferol 50 MCG (2000 UT) TABS, Take 1 tablet by mouth daily., Disp: , Rfl:    Dulaglutide (TRULICITY) 1.5 MG/0.5ML SOAJ, Inject 1.5 mg into the skin once a week., Disp: , Rfl:    ezetimibe (ZETIA) 10 MG tablet, 1 tablet Orally Once a day for 90 days, Disp: , Rfl:    finasteride (PROSCAR) 5 MG tablet, Take 5 mg by mouth daily., Disp: , Rfl:    folic acid (FOLVITE) 1 MG tablet, every other day., Disp: , Rfl:    glucose blood test strip, 1 strip In Vitro three times daily; dx E11.319 for 90  days, Disp: , Rfl:    Insulin Glargine (BASAGLAR KWIKPEN [place]), Inject 8 Units into the skin at bedtime., Disp: , Rfl:    Magnesium 80 MG TABS, Take 80 mg by mouth daily., Disp: , Rfl:    metFORMIN (GLUCOPHAGE) 1000 MG tablet, Take 1,000 mg by mouth 2 (two) times daily with a meal., Disp: , Rfl:    olmesartan (BENICAR) 40 MG tablet, Take 40 mg by mouth daily., Disp: , Rfl:    tamsulosin (FLOMAX) 0.4 MG CAPS capsule, Take 0.4 mg by mouth daily., Disp: , Rfl:    Thiamine HCl (VITAMIN B-1 PO), Take by mouth daily., Disp: , Rfl:    TURMERIC PO, Take by mouth. 1000 mg daily, Disp: , Rfl:    vitamin B-12 (CYANOCOBALAMIN ) 1000 MCG tablet, Take 1,000 mcg by mouth. Every other day, Disp: , Rfl:    Zinc 50 MG TABS, Take 1 tablet by mouth daily at 6 (six) AM., Disp: , Rfl:   Allergies:  Allergies  Allergen Reactions   Bee Venom Anaphylaxis   Hydrochlorothiazide  Other (See Comments)    Dizzy   Iodinated Contrast Media Rash   Iodine Rash  Oxycodone  Nausea And Vomiting   Povidone Iodine Other (See Comments)    UNKNOWN REACTION    Past Medical History, Surgical history, Social history, and Family History were reviewed and updated.  Review of Systems: Review of Systems  Constitutional:  Positive for fatigue.  HENT:  Negative.    Eyes: Negative.   Respiratory: Negative.    Cardiovascular: Negative.   Gastrointestinal:  Positive for constipation.  Endocrine: Negative.   Genitourinary: Negative.    Musculoskeletal:  Positive for arthralgias and myalgias.  Skin: Negative.   Neurological:  Positive for light-headedness.  Hematological: Negative.   Psychiatric/Behavioral: Negative.      Physical Exam:  height is 5\' 6"  (1.676 m) and weight is 163 lb (73.9 kg). His oral temperature is 97.9 F (36.6 C). His pulse is 63. His respiration is 16 and oxygen  saturation is 100%.   Wt Readings from Last 3 Encounters:  09/14/23 163 lb (73.9 kg)  06/29/23 162 lb 6.4 oz (73.7 kg)  03/30/23 163 lb 6.4 oz  (74.1 kg)    Physical Exam Vitals reviewed.  HENT:     Head: Normocephalic and atraumatic.  Eyes:     Pupils: Pupils are equal, round, and reactive to light.  Cardiovascular:     Rate and Rhythm: Normal rate and regular rhythm.     Heart sounds: Normal heart sounds.  Pulmonary:     Effort: Pulmonary effort is normal.     Breath sounds: Normal breath sounds.  Abdominal:     General: Bowel sounds are normal.     Palpations: Abdomen is soft.  Musculoskeletal:        General: No tenderness or deformity. Normal range of motion.     Cervical back: Normal range of motion.  Lymphadenopathy:     Cervical: No cervical adenopathy.  Skin:    General: Skin is warm and dry.     Findings: No erythema or rash.  Neurological:     Mental Status: He is alert and oriented to person, place, and time.  Psychiatric:        Behavior: Behavior normal.        Thought Content: Thought content normal.        Judgment: Judgment normal.      Lab Results  Component Value Date   WBC 10.1 09/14/2023   HGB 10.9 (L) 09/14/2023   HCT 32.9 (L) 09/14/2023   MCV 91.4 09/14/2023   PLT 358 09/14/2023     Chemistry      Component Value Date/Time   NA 131 (L) 06/29/2023 1152   NA 134 (L) 03/31/2014 1432   K 4.7 06/29/2023 1152   K 4.6 03/31/2014 1432   CL 98 06/29/2023 1152   CO2 23 06/29/2023 1152   CO2 23 03/31/2014 1432   BUN 22 06/29/2023 1152   BUN 17.2 03/31/2014 1432   CREATININE 1.12 06/29/2023 1152   CREATININE 1.0 03/31/2014 1432      Component Value Date/Time   CALCIUM 9.1 06/29/2023 1152   CALCIUM 9.5 03/31/2014 1432   ALKPHOS 58 06/29/2023 1152   ALKPHOS 63 02/16/2014 1140   AST 16 06/29/2023 1152   AST 15 02/16/2014 1140   ALT 10 06/29/2023 1152   ALT 12 02/16/2014 1140   BILITOT 0.4 06/29/2023 1152   BILITOT 0.38 02/16/2014 1140      Impression and Plan: Dustin Mccarthy is an 83 year old white male.  He has diabetes.  He has mild anemia.  His hemoglobin is down a little  bit  today.  We will going give a dose of Aranesp .  We will see what his iron levels look like.  I think we can probably get him through the Summer at this point.  He really has done nicely.  I know he still suffering over the fact that he he lost his wife about a year or so ago.  Aaron Aas  Ivor Mars, MD 4/18/202512:23 PM

## 2023-09-26 DIAGNOSIS — H60391 Other infective otitis externa, right ear: Secondary | ICD-10-CM | POA: Diagnosis not present

## 2023-10-03 DIAGNOSIS — H6041 Cholesteatoma of right external ear: Secondary | ICD-10-CM | POA: Diagnosis not present

## 2023-10-03 DIAGNOSIS — H60391 Other infective otitis externa, right ear: Secondary | ICD-10-CM | POA: Diagnosis not present

## 2023-10-04 ENCOUNTER — Encounter: Payer: Self-pay | Admitting: Internal Medicine

## 2023-10-04 ENCOUNTER — Ambulatory Visit: Payer: Medicare Other | Attending: Internal Medicine | Admitting: Internal Medicine

## 2023-10-04 VITALS — BP 110/58 | HR 78 | Ht 66.5 in | Wt 159.0 lb

## 2023-10-04 DIAGNOSIS — E785 Hyperlipidemia, unspecified: Secondary | ICD-10-CM | POA: Insufficient documentation

## 2023-10-04 DIAGNOSIS — I2510 Atherosclerotic heart disease of native coronary artery without angina pectoris: Secondary | ICD-10-CM | POA: Insufficient documentation

## 2023-10-04 DIAGNOSIS — I10 Essential (primary) hypertension: Secondary | ICD-10-CM | POA: Diagnosis not present

## 2023-10-04 DIAGNOSIS — I2583 Coronary atherosclerosis due to lipid rich plaque: Secondary | ICD-10-CM | POA: Insufficient documentation

## 2023-10-04 NOTE — Patient Instructions (Signed)
 Medication Instructions:  NO CHANGES  *If you need a refill on your cardiac medications before your next appointment, please call your pharmacy*   Follow-Up: At [facility], you and your health needs are our priority.  As part of our continuing mission to provide you with exceptional heart care, our providers are all part of one team.  This team includes your primary Cardiologist (physician) and Advanced Practice Providers or APPs (Physician Assistants and Nurse Practitioners) who all work together to provide you with the care you need, when you need it.  Your next appointment:    12 months with Dr. Maximo Spar  We recommend signing up for the patient portal called "MyChart".  Sign up information is provided on this After Visit Summary.  MyChart is used to connect with patients for Virtual Visits (Telemedicine).  Patients are able to view lab/test results, encounter notes, upcoming appointments, etc.  Non-urgent messages can be sent to your provider as well.   To learn more about what you can do with MyChart, go to [email or web address].

## 2023-10-04 NOTE — Progress Notes (Signed)
 OFFICE NOTE  Chief Complaint:  Knee pain  Primary Care Physician: Imelda Man, MD  HPI:  Dustin Mccarthy  Is a 83 year old gentleman who has diabetes type 2, hypertension, dyslipidemia, and obesity. He is on insulin therapy and had a heart catheterization in 2010, which was negative, because of an abnormal stress test. He has also recently had some worsening shortness of breath and difficulty with weight loss. I recommended a metabolic test to further evaluate his shortness of breath. That was performed on March 12, 2012. It showed an RER of 1.06, a peak VO2 of 84% predicted. Heart rate was 87% predicted and the heart rate and VO2 curve showed a good agreement until anaerobic threshold with some flattening of his VO2 curve suggestive of an ischemic response. The VO2, however, is high enough and greater than 60, typically a cutoff for underlying coronary disease. This is a low-risk study and suggests small vessel ischemia. I recommended aerobic exercise and adding l-arginine 3 g p.o. t.i.d. to the diet. He actually did both of these things and reported some marked improvement in his shortness of breath. He did gardening for about 6 months and discontinued it. His exercise is ongoing he is managed to lose 6-8 pounds. He says he feels better and is not bothered by shortness of breath. He been followed closely by Duey Ghent (PharmD) at Dr. Arnetha Langton office. He reports that his diabetes is fairly well controlled with an A1c of 6.9. He is cholesterol is also at goal.  Dustin Mccarthy returns today for follow-up. Recently he's been having some issues after he returned from a long visit in [place] . He has been noted to have high platelets, and elevated white blood cell count and anemia. He's been seen by hematology and they are fairly convinced he does not have a hematologic malignancy. He is also being seen by infectious diseases for possible exposure related abnormalities in his blood work. He denies any chest  pain or worsening shortness of breath but does have significant fatigue. He denies any fevers, chills or constitutional symptoms. He did have a lipid profile in September which showed total cholesterol of 96, triglycerides 57, HDL 35 and LDL of 50.  Dustin Mccarthy returns today for follow-up. Overall he is doing well denies any chest pain or worsening shortness of breath. Blood pressure was mildly elevated at 144/66 however recheck was 132/64. He says recently his blood pressure may be running slightly higher. I've asked him to follow this at home and talk with his primary care provider when he sees him in follow-up about it. A1c is 6.9 and he maintains on insulin and metformin. Cholesterol was recently checked and at goal. He denies any chest pain or worsening shortness of breath.  05/12/2016  Dustin Mccarthy returns today for follow-up. He denies a chest pain worsening shortness of breath. He has some mild sock line edema. Surprisingly his hemoglobin A1c has gone up despite medicine changes. His weight is actually down 17 pounds since I last saw him. He is on Jardience in addition to Toujeo and metformin. Blood pressure is well-controlled today.  04/04/2017  Dustin Mccarthy returns today for follow-up.  Over the last year he is done fairly well.  He denies any worsening chest pain or shortness of breath.  Hemoglobin A1c recently was 6.8.  His cholesterols been well controlled.  He is on a long-acting insulin in addition to oral medications for his diabetes.  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  He denies any specific activity but does say he feels better when he does some work in the yard.  11/30/2017  Dustin Mccarthy returns today for follow-up.  He is done well well over the past year.  Hemoglobin A1c slightly high at 7.1 but hovers around 7.  He is been put on Glyxambi and Toujeo.  The Toujeo does seem to have helped his blood sugars.  He is required less insulin on Glyxambi.  His blood sugars  are now much lower.  Hopefully his more recent A1c will be even lower.  Cholesterol is at goal with most recent lipids this past August showed total cholesterol 103, HDL 34, LDL 48 and triglycerides 106.  He denies any chest pain or shortness of breath.  He is not as physically active as we would like him to be but is committed to starting some exercise and walking.  Weight is actually down about 10 pounds since last year.  04/02/2019  Dustin Mccarthy is seen today in follow-up.  Overall he is doing well and has no complaints.  He denies any chest pain or worsening shortness of breath.  His diabetes is well controlled.  LDL has been at target less than 70.  His blood pressure is also excellent today.  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  05/05/2020  Dustin Mccarthy returns for follow-up.  This is an annual visit although recently he was here with his wife.  Both of them had spent an extended period time out in [place]  visiting with her daughter who was in a cardiac nurse.  He had had some difficulty with shortness of breath particular walking through the airport and had a history of anemia.  Ultimately established with a PCP there and was seen by a hematologist by his report.  He underwent multiple studies including CT scans and blood work and ultimately was placed on some iron.  Symptoms however had not improved and then he was referred to cardiology, being seen by Dr. Chrissie Coupe with [facility]  It sounds like by his report he had an echocardiogram, a cardiac PET and ultimately cardiac catheterization.  We have requested those records.  From what he tells me he was not found to have any obstructive coronary disease.  Today in follow-up he is noted to be slightly hypertensive.  This is been a more consistent trend which she says is similar to his blood pressure numbers at home.  He also has had some lower extremity edema.  EKG today shows sinus rhythm with first-degree AV  block at 63.  Lipids are well controlled with an LDL 45, total cholesterol 308, HDL 37 and triglycerides 99 (03/2020).  08/06/2020  Dustin Mccarthy returns today for follow-up.  Unfortunately I tried him on hydrochlorothiazide  for some additional blood pressure lowering and edema.  He said the swelling did not really change but he was getting very dizzy and basically almost fell out of a chair at his desk twice.  His daughter thought it was likely the HCTZ and stopped it and his symptoms improved.  He still struggling with shortness of breath which also did not improve with the diuretic.  He can walk a small distance and gets short of breath and notes tachycardia.  He was referred to pulmonary and was seen just a few days ago.  No acute cardiopulmonary disease.  I personally felt that the right hemidiaphragm may be a little elevated.  He does have follow-up with pulmonary.  He  did have an extensive work-up in [place]  including cardiac catheterization and PET as well as an echocardiogram all of which were not particularly revealing.  04/28/2021  Dustin Mccarthy is seen today in follow-up.  He was seen by Dr. Marygrace Snellen over the summer.  He underwent cardiopulmonary exercise testing.  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  I suspect he might have diastolic dysfunction as a reason for why he gets short of breath.  He does have significant calcific coronary artery disease however was found to have no significant obstruction by cath in August of last year.  He had numerous tests that were done.  He denies any angina.  His PCP sent him for rehabilitation and physical therapy and he says that doing the exercises and being more active has helped his shortness of breath.  10/04/2023  Dustin Mccarthy is seen today for follow-up.  It has been several years since I saw him.  Unfortunately his wife Lenon Radar passed away 2 years ago.  She was a patient of mine as well.  He has  subsequently stopped traveling out to [place]  where he was going for a while.  He is otherwise doing well.  Denies any chest pain or shortness of breath.  He has having some pain in both of his knees.  He had injections and some fluid removed was told he probably needed knee replacement at some point.  He continues to go to the cancer center is under treatment for iron deficiency anemia.    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   GERD (gastroesophageal reflux disease)    Herpes simplex conjunctivitis    Hyperlipidemia    Hypertension    Hyponatremia 02/24/2014   Iron deficiency anemia due to chronic blood loss 07/07/2019   Obesity    Right shoulder pain 02/24/2014   Type 2 diabetes mellitus (HCC)    Unspecified deficiency anemia 02/16/2014   Weight loss 02/16/2014    Past Surgical History:  Procedure Laterality Date   BACK SURGERY  1988   CARDIAC CATHETERIZATION  2010   after abnormal stress test (03/2009) - mild coronary disease   Cardiometabolic Testing  02/2012   RER of 1.06, peak VO2 84% predicted; HR 87% predicted   HERNIA REPAIR     Skull Fracture Surgery  1945   TRANSTHORACIC ECHOCARDIOGRAM  03/2009   EF 45-50%, mild conc LVH, mod septal hypokinesis, mod apical wall hypokinesis; RV systolic function borderline reduced; trace MR/TR    FAMHx:  Family History  Problem Relation Age of Onset   Hypertension Mother    Diabetes Mother    Heart attack Mother    Stroke Mother    Parkinson's disease Father    Heart disease Father    Diabetes Child    Cancer Paternal Uncle        throat ca   Colon cancer Neg Hx    Esophageal cancer Neg Hx    Stomach cancer Neg Hx    Pancreatic cancer Neg Hx     SOCHx:   reports that he quit smoking about 44 years ago. His smoking use included cigarettes. He started  smoking about 59 years ago. He has a 3.8 pack-year smoking history. He has never used smokeless tobacco. He reports current alcohol  use  of about 7.0 standard drinks of alcohol  per week. He reports that he does not use drugs.  ALLERGIES:  Allergies  Allergen Reactions   Bee Venom Anaphylaxis, Hives and Other (See Comments)   Hydrochlorothiazide  Other (See Comments)    Dizzy  Other Reaction(s): Dizziness   Iodinated Contrast Media Rash   Iodine Rash   Oxycodone  Nausea And Vomiting, Anxiety and Nausea Only    Cold sweats, n/v and "out of it."   Povidone Iodine Other (See Comments)    UNKNOWN REACTION   Povidone-Iodine Other (See Comments)    UNKNOWN REACTION    ROS: Pertinent items noted in HPI and remainder of comprehensive ROS otherwise negative.  HOME MEDS: Current Outpatient Medications  Medication Sig Dispense Refill   ACCU-CHEK AVIVA PLUS test strip 1 strip by Other route 2 (two) times daily. Use 1 strip to check glucose twice a day  0   APPLE CIDER VINEGAR PO Take by mouth. 250 mg daily     aspirin 81 MG chewable tablet every other day.     Cholecalciferol 50 MCG (2000 UT) TABS Take 1 tablet by mouth daily.     Dulaglutide (TRULICITY) 1.5 MG/0.5ML SOAJ Inject 1.5 mg into the skin once a week.     ezetimibe (ZETIA) 10 MG tablet 1 tablet Orally Once a day for 90 days     finasteride (PROSCAR) 5 MG tablet Take 5 mg by mouth daily.     folic acid (FOLVITE) 1 MG tablet every other day.     furosemide  (LASIX ) 20 MG tablet Take 1 tablet (20 mg total) by mouth daily. 30 tablet 4   glucose blood test strip 1 strip In Vitro three times daily; dx E11.319 for 90 days     Insulin Glargine (BASAGLAR KWIKPEN [place]) Inject 8 Units into the skin at bedtime.     Magnesium 400 MG CAPS Take 400 mg by mouth daily.     metFORMIN (GLUCOPHAGE) 1000 MG tablet Take 1,000 mg by mouth 2 (two) times daily with a meal.     OLMESARTAN MEDOXOMIL PO Take 30 mg by mouth daily.     tamsulosin (FLOMAX) 0.4 MG  CAPS capsule Take 0.4 mg by mouth daily.     Thiamine HCl (VITAMIN B-1 PO) Take by mouth daily.     TURMERIC PO Take 2,250 mg by mouth daily. 1000 mg daily     vitamin B-12 (CYANOCOBALAMIN ) 1000 MCG tablet Take 1,000 mcg by mouth. Every other day     Zinc 50 MG TABS Take 1 tablet by mouth daily at 6 (six) AM.     No current facility-administered medications for this visit.    LABS/IMAGING: No results found for this or any previous visit (from the past 48 hours).  No results found.  VITALS: BP (!) 110/58 (BP Location: Right Arm, Patient Position: Sitting, Cuff Size: Normal)   Pulse 78   Ht 5' 6.5" (1.689 m)   Wt 159 lb (72.1 kg)   SpO2 98%   BMI 25.28 kg/m   EXAM: General appearance: alert and no distress Neck: no carotid bruit and no JVD Lungs: clear to auscultation bilaterally Heart: regular rate and rhythm, S1, S2 normal, no murmur, click, rub or gallop Abdomen: soft, non-tender; bowel sounds normal; no masses,  no organomegaly Extremities: extremities normal, atraumatic, no cyanosis or edema Pulses: 2+ and symmetric Skin: Skin color, texture, turgor normal. 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   Dustin Mccarthy seems to be doing well without any new  chest pain or worsening shortness of breath.  He continues to have some issues with knee pain.  He has not recently gone back out to [place]  for any cardiac care.  EKG today shows a left anterior fascicular block in addition to incomplete right bundle branch block.  Heart rate is stayed stable.  He is not on any AV nodal blocking agents.  Otherwise blood pressure is well-controlled.  Lipids are also pretty good with recent LDL of 87 in March.  Plan follow-up with me annually or sooner as necessary.  Hazle Lites, MD, [facility], FNLA, FACP  [place]  [facility]  Medical Director of the Advanced Lipid Disorders &  Cardiovascular Risk Reduction Clinic Diplomate of the American Board of Clinical Lipidology Attending Cardiologist  Direct Dial: [phone]  Fax: [phone]  Website:  [email or web address]   Aviva Lemmings Makynzi Eastland 10/04/2023, 2:57 PM

## 2023-10-10 DIAGNOSIS — H60391 Other infective otitis externa, right ear: Secondary | ICD-10-CM | POA: Diagnosis not present

## 2023-10-10 DIAGNOSIS — G4700 Insomnia, unspecified: Secondary | ICD-10-CM | POA: Diagnosis not present

## 2023-10-10 DIAGNOSIS — H6041 Cholesteatoma of right external ear: Secondary | ICD-10-CM | POA: Diagnosis not present

## 2023-11-05 DIAGNOSIS — E113311 Type 2 diabetes mellitus with moderate nonproliferative diabetic retinopathy with macular edema, right eye: Secondary | ICD-10-CM | POA: Diagnosis not present

## 2023-11-05 DIAGNOSIS — H35033 Hypertensive retinopathy, bilateral: Secondary | ICD-10-CM | POA: Diagnosis not present

## 2023-11-05 DIAGNOSIS — H26493 Other secondary cataract, bilateral: Secondary | ICD-10-CM | POA: Diagnosis not present

## 2023-11-05 DIAGNOSIS — H3582 Retinal ischemia: Secondary | ICD-10-CM | POA: Diagnosis not present

## 2023-11-05 DIAGNOSIS — H35043 Retinal micro-aneurysms, unspecified, bilateral: Secondary | ICD-10-CM | POA: Diagnosis not present

## 2023-11-07 DIAGNOSIS — L812 Freckles: Secondary | ICD-10-CM | POA: Diagnosis not present

## 2023-11-07 DIAGNOSIS — L570 Actinic keratosis: Secondary | ICD-10-CM | POA: Diagnosis not present

## 2023-11-07 DIAGNOSIS — D1801 Hemangioma of skin and subcutaneous tissue: Secondary | ICD-10-CM | POA: Diagnosis not present

## 2023-11-07 DIAGNOSIS — L565 Disseminated superficial actinic porokeratosis (DSAP): Secondary | ICD-10-CM | POA: Diagnosis not present

## 2023-11-07 DIAGNOSIS — L821 Other seborrheic keratosis: Secondary | ICD-10-CM | POA: Diagnosis not present

## 2023-11-07 DIAGNOSIS — Z85828 Personal history of other malignant neoplasm of skin: Secondary | ICD-10-CM | POA: Diagnosis not present

## 2023-11-07 DIAGNOSIS — Z85820 Personal history of malignant melanoma of skin: Secondary | ICD-10-CM | POA: Diagnosis not present

## 2023-11-15 DIAGNOSIS — I10 Essential (primary) hypertension: Secondary | ICD-10-CM | POA: Diagnosis not present

## 2023-11-15 DIAGNOSIS — T466X5A Adverse effect of antihyperlipidemic and antiarteriosclerotic drugs, initial encounter: Secondary | ICD-10-CM | POA: Diagnosis not present

## 2023-11-15 DIAGNOSIS — E785 Hyperlipidemia, unspecified: Secondary | ICD-10-CM | POA: Diagnosis not present

## 2023-11-15 DIAGNOSIS — E11319 Type 2 diabetes mellitus with unspecified diabetic retinopathy without macular edema: Secondary | ICD-10-CM | POA: Diagnosis not present

## 2023-11-15 DIAGNOSIS — M791 Myalgia, unspecified site: Secondary | ICD-10-CM | POA: Diagnosis not present

## 2023-11-15 DIAGNOSIS — Z794 Long term (current) use of insulin: Secondary | ICD-10-CM | POA: Diagnosis not present

## 2023-11-15 DIAGNOSIS — E7841 Elevated Lipoprotein(a): Secondary | ICD-10-CM | POA: Diagnosis not present

## 2023-11-21 DIAGNOSIS — H6041 Cholesteatoma of right external ear: Secondary | ICD-10-CM | POA: Diagnosis not present

## 2023-11-21 DIAGNOSIS — H60391 Other infective otitis externa, right ear: Secondary | ICD-10-CM | POA: Diagnosis not present

## 2023-11-21 NOTE — Progress Notes (Signed)
  Subjective  Patient ID: Dustin Mccarthy is a 83 y.o. male being seen for:  Chief Complaint  Patient presents with  . 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  He had granulation tissue noted in the ear canal which was cauterized in the past and CT at the time done in the office several months ago and did not show any bony erosion.  He was intermittently on several courses of antibiotics and last visit had an area of exposed bone which had not fully healed and some concern for a developing canal cholesteatoma.  Review of Systems: all relevant systems have been reviewed unless otherwise documented.  Past Medical History:  Diagnosis Date  . Coronary artery disease   . Diabetes mellitus    (CMD)   . DOE (dyspnea on exertion)   . Herpes simplex conjunctivitis   . Hyperlipidemia   . Hypertension   . Hyponatremia   . Vitamin B12 deficiency anemia, unspecified     Past Surgical History:  Procedure Laterality Date  . BACK SURGERY     Procedure: BACK SURGERY  . HERNIA REPAIR     Procedure: HERNIA REPAIR  . OTHER SURGICAL HISTORY     Procedure: OTHER SURGICAL HISTORY (Cardiac catheterization)  . OTHER SURGICAL HISTORY     Procedure: OTHER SURGICAL HISTORY (Skull fracture surgery)  . OTHER SURGICAL HISTORY     Procedure: OTHER SURGICAL HISTORY (Transthoracic); Echocardiogram    Family History  Problem Relation Name Age of Onset  . Hypertension Mother    . Diabetes Mother    . Heart attack Mother    . Stroke Mother    . Parkinsonism Father    . Heart disease Father    . Throat cancer Paternal Uncle      Allergies  Allergen Reactions  . Bee Sting Anaphylaxis  . Venom-Honey Bee Other (See Comments), Anaphylaxis and Hives  . Hydrochlorothiazide  Dizziness and Other (See Comments)    Dizzy  . Iodinated Contrast Media Rash  . Iodine Other (See Comments) and Rash  . Oxycodone  GI Intolerance, Anxiety and  Nausea And Vomiting    Cold sweats, n/v and out of it.  . Povidone-Iodine Other (See Comments)    UNKNOWN REACTION     Objective  Physical Exam: General/Constitutional: Patient is a well-nourished, well-developed in no distress. Answers questions appropriately.  Skin/scalp : Normal and without lesions. No rashes, ulcerations or masses noted.  Head: No facial deformities  Eyes: Vision grossly intact. Normal extraocular movements. No nystagmus noted.  Ears: Right ear: Healed area of the right external canal without exposed bone or granulation tissue.  Normal tympanic membrane. Left ear: Normal ear canal. Normal tympanic membrane.  Nose: Septum midline, turbinates slightly enlarged  Oral cavity and oropharynx: No concerning lesions in the oral cavity or pharynx.  Neck: No palpable masses or lesions   Assessment/Plan  1. 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  We discussed potential for recurrence and to call if this develops.  2. Cholesteatoma of external auditory canal, right We discussed that he had previous findings consistent with an ear canal cholesteatoma.  This appears to have resolved on exam today.   No orders of the defined types were placed in this encounter.   No follow-ups on file.   Electronically signed by: Arthea Fries, MD 11/21/2023 2:23 PM

## 2023-11-22 DIAGNOSIS — E113311 Type 2 diabetes mellitus with moderate nonproliferative diabetic retinopathy with macular edema, right eye: Secondary | ICD-10-CM | POA: Diagnosis not present

## 2024-01-10 ENCOUNTER — Emergency Department (HOSPITAL_COMMUNITY)
Admission: EM | Admit: 2024-01-10 | Discharge: 2024-01-10 | Disposition: A | Discharge status: Home / Self Care | Attending: Emergency Medicine | Admitting: Emergency Medicine

## 2024-01-10 ENCOUNTER — Encounter (HOSPITAL_COMMUNITY): Payer: Self-pay

## 2024-01-10 ENCOUNTER — Emergency Department (HOSPITAL_COMMUNITY)

## 2024-01-10 ENCOUNTER — Other Ambulatory Visit: Payer: Self-pay

## 2024-01-10 DIAGNOSIS — G319 Degenerative disease of nervous system, unspecified: Secondary | ICD-10-CM | POA: Diagnosis not present

## 2024-01-10 DIAGNOSIS — I6789 Other cerebrovascular disease: Secondary | ICD-10-CM | POA: Insufficient documentation

## 2024-01-10 DIAGNOSIS — D509 Iron deficiency anemia, unspecified: Secondary | ICD-10-CM | POA: Insufficient documentation

## 2024-01-10 DIAGNOSIS — Z79899 Other long term (current) drug therapy: Secondary | ICD-10-CM | POA: Diagnosis not present

## 2024-01-10 DIAGNOSIS — I10 Essential (primary) hypertension: Secondary | ICD-10-CM | POA: Insufficient documentation

## 2024-01-10 DIAGNOSIS — I6782 Cerebral ischemia: Secondary | ICD-10-CM | POA: Diagnosis not present

## 2024-01-10 DIAGNOSIS — I4510 Unspecified right bundle-branch block: Secondary | ICD-10-CM | POA: Diagnosis not present

## 2024-01-10 DIAGNOSIS — E785 Hyperlipidemia, unspecified: Secondary | ICD-10-CM | POA: Diagnosis not present

## 2024-01-10 DIAGNOSIS — I2510 Atherosclerotic heart disease of native coronary artery without angina pectoris: Secondary | ICD-10-CM | POA: Diagnosis not present

## 2024-01-10 DIAGNOSIS — R42 Dizziness and giddiness: Secondary | ICD-10-CM | POA: Insufficient documentation

## 2024-01-10 DIAGNOSIS — I771 Stricture of artery: Secondary | ICD-10-CM | POA: Diagnosis not present

## 2024-01-10 DIAGNOSIS — Z7984 Long term (current) use of oral hypoglycemic drugs: Secondary | ICD-10-CM | POA: Diagnosis not present

## 2024-01-10 DIAGNOSIS — Z794 Long term (current) use of insulin: Secondary | ICD-10-CM | POA: Insufficient documentation

## 2024-01-10 DIAGNOSIS — E119 Type 2 diabetes mellitus without complications: Secondary | ICD-10-CM | POA: Insufficient documentation

## 2024-01-10 DIAGNOSIS — J341 Cyst and mucocele of nose and nasal sinus: Secondary | ICD-10-CM | POA: Diagnosis not present

## 2024-01-10 DIAGNOSIS — Z7982 Long term (current) use of aspirin: Secondary | ICD-10-CM | POA: Insufficient documentation

## 2024-01-10 LAB — COMPREHENSIVE METABOLIC PANEL WITH GFR
ALT: 14 U/L (ref 0–44)
AST: 24 U/L (ref 15–41)
Albumin: 3.5 g/dL (ref 3.5–5.0)
Alkaline Phosphatase: 49 U/L (ref 38–126)
Anion gap: 14 (ref 5–15)
BUN: 27 mg/dL — ABNORMAL HIGH (ref 8–23)
CO2: 21 mmol/L — ABNORMAL LOW (ref 22–32)
Calcium: 9.7 mg/dL (ref 8.9–10.3)
Chloride: 101 mmol/L (ref 98–111)
Creatinine, Ser: 1.39 mg/dL — ABNORMAL HIGH (ref 0.61–1.24)
GFR, Estimated: 50 mL/min — ABNORMAL LOW (ref >60)
Glucose, Bld: 119 mg/dL — ABNORMAL HIGH (ref 70–99)
Potassium: 4.8 mmol/L (ref 3.5–5.1)
Sodium: 136 mmol/L (ref 135–145)
Total Bilirubin: 0.5 mg/dL (ref 0.0–1.2)
Total Protein: 6.6 g/dL (ref 6.5–8.1)

## 2024-01-10 LAB — CBC WITH DIFFERENTIAL/PLATELET
Abs Immature Granulocytes: 0.04 K/uL (ref 0.00–0.07)
Basophils Absolute: 0.1 K/uL (ref 0.0–0.1)
Basophils Relative: 1 %
Eosinophils Absolute: 0.2 K/uL (ref 0.0–0.5)
Eosinophils Relative: 2 %
HCT: 34.9 % — ABNORMAL LOW (ref 39.0–52.0)
Hemoglobin: 11.4 g/dL — ABNORMAL LOW (ref 13.0–17.0)
Immature Granulocytes: 1 %
Lymphocytes Relative: 46 %
Lymphs Abs: 4 K/uL (ref 0.7–4.0)
MCH: 29.3 pg (ref 26.0–34.0)
MCHC: 32.7 g/dL (ref 30.0–36.0)
MCV: 89.7 fL (ref 80.0–100.0)
Monocytes Absolute: 0.7 K/uL (ref 0.1–1.0)
Monocytes Relative: 9 %
Neutro Abs: 3.4 K/uL (ref 1.7–7.7)
Neutrophils Relative %: 41 %
Platelets: 363 K/uL (ref 150–400)
RBC: 3.89 MIL/uL — ABNORMAL LOW (ref 4.22–5.81)
RDW: 15.4 % (ref 11.5–15.5)
WBC: 8.3 K/uL (ref 4.0–10.5)
nRBC: 0 % (ref 0.0–0.2)

## 2024-01-10 LAB — MAGNESIUM: Magnesium: 1.8 mg/dL (ref 1.7–2.4)

## 2024-01-10 LAB — BRAIN NATRIURETIC PEPTIDE: B Natriuretic Peptide: 33.2 pg/mL (ref 0.0–100.0)

## 2024-01-10 LAB — TROPONIN I (HIGH SENSITIVITY)
Troponin I (High Sensitivity): 8 ng/L (ref <18)
Troponin I (High Sensitivity): 10 ng/L (ref <18)

## 2024-01-10 LAB — CBG MONITORING, ED: Glucose-Capillary: 113 mg/dL — ABNORMAL HIGH (ref 70–99)

## 2024-01-10 MED ORDER — GADOBUTROL 1 MMOL/ML IV SOLN
7.0000 mL | Freq: Once | INTRAVENOUS | Status: AC | PRN
  Administered 2024-01-10: 7 mL via INTRAVENOUS

## 2024-01-10 MED ORDER — MECLIZINE HCL 25 MG PO TABS: 25.0000 mg | ORAL_TABLET | Freq: Three times a day (TID) | ORAL | 0 refills | Status: AC | PRN

## 2024-01-10 NOTE — ED Triage Notes (Signed)
 Patient BIB EMS from home C/O dizziness upon awakening. Patient states that they woke up around 0700 this morning and when he stood up he felt dizzy so he laid back down. Patient then woke up again at 0930 and was still dizzy. Patient's only complaint is dizziness at this time.

## 2024-01-10 NOTE — ED Notes (Signed)
 Patient states he has a history of hypertension, but did not take his medication this morning.

## 2024-01-10 NOTE — ED Notes (Signed)
 In MRI during  tech Rounds

## 2024-01-10 NOTE — ED Provider Notes (Signed)
  EMERGENCY DEPARTMENT AT [facility] Provider Note   CSN: [id number] Arrival date & time: 01/10/24  1029     Patient presents with: Dizziness   Dustin Mccarthy is a 83 y.o. 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  Patient is accompanied by family members about provide medical history.  Patient states that this a.m. he tried to get out of bed however had to lay back down secondary to dizziness described as the room spinning and inability to ambulate.  Patient states that he fell back asleep however when he was awoken once again by his son he had recurrence of symptoms when trying to stand up.  Patient states that when he goes from a sitting to a standing position he has associated dizziness described as room spinning.  Patient endorsed associated nausea.  Patient denies chest pain, shortness of breath, syncope.  Patient endorses adequate p.o. intake yesterday however denies p.o. intake today.  Denies associated fever, abdominal pain.    Dizziness      Prior to Admission medications   Medication Sig Start Date End Date Taking? Authorizing Provider  ACCU-CHEK AVIVA PLUS test strip 1 strip by Other route 2 (two) times daily. Use 1 strip to check glucose twice a day 02/16/15   [provider]  APPLE CIDER VINEGAR PO Take by mouth. 250 mg daily    [provider]  aspirin 81 MG chewable tablet every other day. 03/28/18   [provider]  Cholecalciferol 50 MCG (2000 UT) TABS Take 1 tablet by mouth daily. 07/18/19   [provider]  Dulaglutide (TRULICITY) 1.5 MG/0.5ML SOAJ Inject 1.5 mg into the skin once a week. 01/28/19   [provider]   ezetimibe (ZETIA) 10 MG tablet 1 tablet Orally Once a day for 90 days    [provider]  finasteride (PROSCAR) 5 MG tablet Take 5 mg by mouth daily. 05/02/19   [provider]  folic acid (FOLVITE) 1 MG tablet every other day. 05/09/18   [provider]  furosemide  (LASIX ) 20 MG tablet Take 1 tablet (20 mg total) by mouth daily. 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    [provider]  Magnesium 400 MG CAPS Take 400 mg by mouth daily.    [provider]  metFORMIN (GLUCOPHAGE) 1000 MG tablet Take 1,000 mg by mouth 2 (two) times daily with a meal.    [provider]  OLMESARTAN MEDOXOMIL PO Take 30 mg by mouth daily. 09/07/21   [provider]  tamsulosin (FLOMAX) 0.4 MG CAPS capsule Take 0.4 mg by mouth daily.    [provider]  Thiamine HCl (VITAMIN B-1 PO) Take by mouth daily.    [provider]  TURMERIC PO Take 2,250 mg by mouth daily. 1000 mg daily    [provider]  vitamin B-12 (CYANOCOBALAMIN ) 1000 MCG tablet Take 1,000 mcg by mouth. Every other day    [provider]  Zinc 50 MG TABS Take 1 tablet by mouth daily at 6 (six) AM.  [provider]    Allergies: Bee venom, Hydrochlorothiazide , Iodinated contrast media, Iodine, Oxycodone , Povidone iodine, and Povidone-iodine    Review of Systems  Neurological:  Positive for dizziness.    Updated Vital Signs BP (!) 159/73 (BP Location: Right Arm)   Pulse (!) 59   Temp 98.2 F (36.8 C) (Oral)   Resp 19   SpO2 98%   Physical Exam  (all labs ordered are listed, but only abnormal results are displayed) Labs Reviewed  CBC WITH DIFFERENTIAL/PLATELET - Abnormal; Notable for the following components:      Result Value   RBC 3.89 (*)     Hemoglobin 11.4 (*)    HCT 34.9 (*)    All other components within normal limits  COMPREHENSIVE METABOLIC PANEL WITH GFR - Abnormal; Notable for the following components:   CO2 21 (*)    Glucose, Bld 119 (*)    BUN 27 (*)    Creatinine, Ser 1.39 (*)    GFR, Estimated 50 (*)    All other components within normal limits  CBG MONITORING, ED - Abnormal; Notable for the following components:   Glucose-Capillary 113 (*)    All other components within normal limits  MAGNESIUM  BRAIN NATRIURETIC PEPTIDE  TROPONIN I (HIGH SENSITIVITY)  TROPONIN I (HIGH SENSITIVITY)    EKG: EKG Interpretation Date/Time:  Thursday January 10 2024 10:33:25 EDT Ventricular Rate:  60 PR Interval:  349 QRS Duration:  155 QT Interval:  450 QTC Calculation: 450 R Axis:   -52  Text Interpretation: Sinus rhythm Prolonged PR interval 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   Procedures   Medications Ordered in the ED - No data to display  Clinical Course as of 01/10/24 1538  Thu Jan 10, 2024  1509 S- htn, hld Dizziness x2 on standing MR and neuro vs dc [RC]    Clinical Course User Index [RC] Sharyne Darina RAMAN, MD                                 Medical Decision Making Amount and/or Complexity of Data Reviewed Labs: ordered. Radiology: ordered.   On initial evaluation patient is bradycardic however otherwise hemodynamically stable, afebrile and saturating well on room air.  Based on patient's history differential diagnosis includes central versus peripheral vertigo, dehydration, infection, electrolyte abnormality, ACS/MI.  Patient does not currently have focal neurologic deficits however with repetitive episodes of vertigo we will obtain laboratory studies as well as MRI imaging to assess for central cause of vertiginous symptoms.  Patient's presentation less likely ACS/MI as  patient's EKG is nonischemic with troponins within normal limits and no associated chest pain.  Laboratory studies without evidence of infection, significant anemia, significant electrolyte abnormalities.  Troponin and BNP within normal limits.  Patient does state his vertiginous symptoms are provoked with head movement therefore peripheral vertigo most likely at this time.  At the time of handoff, MRI studies needing to be completed.  Discussed with patient that if MRI is negative for central causes of vertigo I would recommend following up with patient's primary care provider as well as ENT for peripheral vertigo.  Would recommend meclizine  on discharge.  Patient handed off to incoming team and patient's family members updated at bedside     Final diagnoses:  Dizziness    ED Discharge Orders  None       Lavanda Bolster DO Emergency Medicine PGY2    Bolster Lavanda, DO 01/10/24 1538    Tegeler, Lonni PARAS, MD 01/11/24 1256

## 2024-01-10 NOTE — ED Provider Notes (Signed)
 Patient care assumed from previous provider.   Patient care of Dustin Mccarthy is a 83 y.o. male from previous provider. Please see the original provider note from this emergency department encounter for full history and physical.   Course of Care and my assessment at the time of sign out is detailed in the ED Course below.   Clinical Course as of 01/10/24 1648  Thu Jan 10, 2024  1509 S- htn, hld Dizziness x2 on standing MR and neuro vs dc [RC]  1646 MR Angiogram Neck W or Wo Contrast No critical stenosis [RC]  1646 MR ANGIO HEAD WO CONTRAST No acute pathology [RC]  1646 MR BRAIN WO CONTRAST No acute pathology [RC]    Clinical Course User Index [RC] Sharyne Darina RAMAN, MD   On my reassessment patient hemodynamically intact and neurologically intact.  He is able to stand and ambulate without difficulty.  Given his reassuring MRIs as above do not believe neurologic consultation is required given normal exam.  Discussed this with the patient and family at bedside who state understanding and are in agreement with discharge with close follow-up with their PCP and strict return precautions.  Discharged in stable condition.  Labs reviewed by myself and considered in medical decision making.  Imaging reviewed by myself and considered in medical decision making. Imaging final read interpreted by radiology.  1. Dizziness     Discharge   The plan for this patient was discussed with Dr. garrick, who voiced agreement and who oversaw evaluation and treatment of this patient.    Sharyne Darina RAMAN, MD 01/10/24 1648    garrick [place], MD 01/10/24 2308

## 2024-01-12 ENCOUNTER — Telehealth: Payer: Self-pay

## 2024-01-12 NOTE — Telephone Encounter (Signed)
 Patient son called in to ask about medication, it was not at the [facility] on [address] where he usually gets scripts. Chart review done, the medicaiton was sent to [facility], he only gets his diabetic items there. Called in Prescription at [facility]. Called [facility] to cancel the other, but they are closed at this time.

## 2024-01-16 DIAGNOSIS — R42 Dizziness and giddiness: Secondary | ICD-10-CM | POA: Diagnosis not present

## 2024-01-16 DIAGNOSIS — H2512 Age-related nuclear cataract, left eye: Secondary | ICD-10-CM | POA: Diagnosis not present

## 2024-01-16 DIAGNOSIS — Z7689 Persons encountering health services in other specified circumstances: Secondary | ICD-10-CM | POA: Diagnosis not present

## 2024-01-16 DIAGNOSIS — Z961 Presence of intraocular lens: Secondary | ICD-10-CM | POA: Diagnosis not present

## 2024-01-16 DIAGNOSIS — E113211 Type 2 diabetes mellitus with mild nonproliferative diabetic retinopathy with macular edema, right eye: Secondary | ICD-10-CM | POA: Diagnosis not present

## 2024-01-16 DIAGNOSIS — H35351 Cystoid macular degeneration, right eye: Secondary | ICD-10-CM | POA: Diagnosis not present

## 2024-02-04 DIAGNOSIS — E113311 Type 2 diabetes mellitus with moderate nonproliferative diabetic retinopathy with macular edema, right eye: Secondary | ICD-10-CM | POA: Diagnosis not present

## 2024-02-12 ENCOUNTER — Inpatient Hospital Stay

## 2024-02-12 ENCOUNTER — Inpatient Hospital Stay: Admitting: Family

## 2024-02-18 ENCOUNTER — Inpatient Hospital Stay

## 2024-02-18 ENCOUNTER — Ambulatory Visit: Payer: Self-pay | Admitting: Hematology & Oncology

## 2024-02-18 ENCOUNTER — Inpatient Hospital Stay (HOSPITAL_BASED_OUTPATIENT_CLINIC_OR_DEPARTMENT_OTHER): Admitting: Family

## 2024-02-18 ENCOUNTER — Inpatient Hospital Stay: Attending: Hematology & Oncology

## 2024-02-18 VITALS — BP 136/61 | HR 62 | Temp 98.0°F | Resp 17 | Ht 66.0 in | Wt 154.8 lb

## 2024-02-18 DIAGNOSIS — D500 Iron deficiency anemia secondary to blood loss (chronic): Secondary | ICD-10-CM | POA: Diagnosis not present

## 2024-02-18 DIAGNOSIS — E1122 Type 2 diabetes mellitus with diabetic chronic kidney disease: Secondary | ICD-10-CM | POA: Diagnosis present

## 2024-02-18 DIAGNOSIS — D631 Anemia in chronic kidney disease: Secondary | ICD-10-CM

## 2024-02-18 DIAGNOSIS — N189 Chronic kidney disease, unspecified: Secondary | ICD-10-CM | POA: Insufficient documentation

## 2024-02-18 LAB — CBC WITH DIFFERENTIAL (CANCER CENTER ONLY)
Abs Immature Granulocytes: 0.05 K/uL (ref 0.00–0.07)
Basophils Absolute: 0.1 K/uL (ref 0.0–0.1)
Basophils Relative: 1 %
Eosinophils Absolute: 0.1 K/uL (ref 0.0–0.5)
Eosinophils Relative: 1 %
HCT: 32 % — ABNORMAL LOW (ref 39.0–52.0)
Hemoglobin: 10.4 g/dL — ABNORMAL LOW (ref 13.0–17.0)
Immature Granulocytes: 1 %
Lymphocytes Relative: 31 %
Lymphs Abs: 3.1 K/uL (ref 0.7–4.0)
MCH: 29.9 pg (ref 26.0–34.0)
MCHC: 32.5 g/dL (ref 30.0–36.0)
MCV: 92 fL (ref 80.0–100.0)
Monocytes Absolute: 0.8 K/uL (ref 0.1–1.0)
Monocytes Relative: 8 %
Neutro Abs: 6 K/uL (ref 1.7–7.7)
Neutrophils Relative %: 58 %
Platelet Count: 375 K/uL (ref 150–400)
RBC: 3.48 MIL/uL — ABNORMAL LOW (ref 4.22–5.81)
RDW: 14.9 % (ref 11.5–15.5)
WBC Count: 10.1 K/uL (ref 4.0–10.5)
nRBC: 0 % (ref 0.0–0.2)

## 2024-02-18 LAB — CMP (CANCER CENTER ONLY)
ALT: 11 U/L (ref 0–44)
AST: 21 U/L (ref 15–41)
Albumin: 4.3 g/dL (ref 3.5–5.0)
Alkaline Phosphatase: 62 U/L (ref 38–126)
Anion gap: 13 (ref 5–15)
BUN: 27 mg/dL — ABNORMAL HIGH (ref 8–23)
CO2: 21 mmol/L — ABNORMAL LOW (ref 22–32)
Calcium: 9.3 mg/dL (ref 8.9–10.3)
Chloride: 100 mmol/L (ref 98–111)
Creatinine: 1.39 mg/dL — ABNORMAL HIGH (ref 0.61–1.24)
GFR, Estimated: 50 mL/min — ABNORMAL LOW (ref >60)
Glucose, Bld: 129 mg/dL — ABNORMAL HIGH (ref 70–99)
Potassium: 4.7 mmol/L (ref 3.5–5.1)
Sodium: 134 mmol/L — ABNORMAL LOW (ref 135–145)
Total Bilirubin: 0.4 mg/dL (ref 0.0–1.2)
Total Protein: 6.7 g/dL (ref 6.5–8.1)

## 2024-02-18 LAB — RETICULOCYTES
Immature Retic Fract: 13.5 % (ref 2.3–15.9)
RBC.: 3.52 MIL/uL — ABNORMAL LOW (ref 4.22–5.81)
Retic Count, Absolute: 53.5 K/uL (ref 19.0–186.0)
Retic Ct Pct: 1.5 % (ref 0.4–3.1)

## 2024-02-18 LAB — IRON AND IRON BINDING CAPACITY (CC-WL,HP ONLY)
Iron: 84 ug/dL (ref 45–182)
Saturation Ratios: 27 % (ref 17.9–39.5)
TIBC: 315 ug/dL (ref 250–450)
UIBC: 231 ug/dL

## 2024-02-18 LAB — FERRITIN: Ferritin: 291 ng/mL (ref 24–336)

## 2024-02-18 MED ORDER — DARBEPOETIN ALFA 300 MCG/0.6ML IJ SOSY
300.0000 ug | PREFILLED_SYRINGE | Freq: Once | INTRAMUSCULAR | Status: AC
  Administered 2024-02-18: 300 ug via SUBCUTANEOUS
  Filled 2024-02-18: qty 0.6

## 2024-02-18 NOTE — Progress Notes (Signed)
 Hematology and Oncology Follow Up Visit  Dustin Mccarthy [id number] 08-05-1940 83 y.o. 02/18/2024   Principle Diagnosis:  Iron deficiency Anemia Erythropoietin  def Anemia   Current Therapy:        IV iron as indicated  Aranesp  300 mcg sq for Hgb <11    Interim History:  Dustin Mccarthy is here today for follow-up. He is symptomatic with fatigue and recently started having bouts with dizziness. He did go to the ED for this on 8/14 and states that this is felt to be due to vertigo.  He is ambulating with a cane for added support.  No falls or syncope reported.  No fever, chills, n/v, cough, rash, SOB, chest pain, palpitations, abdominal pain or change in bowel or bladder habits.  Mild swelling in the ankles comes and goes. No numbness or tingling at this time.  Appetite is fair. He has had a hard time cooking meals for one since his wife passed a little over 2 years ago. They were married for 62 years and she is missed.  Hydration has been good. Weight is stable at 154 lbs.   ECOG Performance Status: 1 - Symptomatic but completely ambulatory  Medications:  Allergies as of 02/18/2024       Reactions   Bee Venom Anaphylaxis, Hives, Other (See Comments)   Hydrochlorothiazide  Other (See Comments)   Dizzy Other Reaction(s): Dizziness   Iodinated Contrast Media Rash   Iodine Rash   Oxycodone  Nausea And Vomiting, Anxiety, Nausea Only   Cold sweats, n/v and out of it.   Povidone Iodine Other (See Comments)   UNKNOWN REACTION   Povidone-iodine Other (See Comments)   UNKNOWN REACTION        Medication List        Accurate as of February 18, 2024  2:36 PM. If you have any questions, ask your nurse or doctor.          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 Use 1 strip to check glucose twice a day   APPLE CIDER VINEGAR  PO Take by mouth. 250 mg daily   aspirin 81 MG chewable tablet every other day.   BASAGLAR KWIKPEN  Inject 8 Units into the skin at bedtime.   Cholecalciferol 50 MCG (2000 UT) Tabs Take 1 tablet by mouth daily.   cyanocobalamin  1000 MCG tablet Commonly known as: VITAMIN B12 Take 1,000 mcg by mouth. Every other day   ezetimibe 10 MG tablet Commonly known as: ZETIA 1 tablet Orally Once a day for 90 days   finasteride 5 MG tablet Commonly known as: PROSCAR Take 5 mg by mouth daily.   folic acid 1 MG tablet Commonly known as: FOLVITE every other day.   furosemide  20 MG tablet Commonly known as: LASIX  Take 1 tablet (20 mg total) by mouth daily.   Magnesium 400 MG Caps Take 400 mg by mouth daily.   meclizine  25 MG tablet Commonly known as: ANTIVERT  Take 1 tablet (25 mg total) by mouth 3 (three) times daily as needed for dizziness.   metFORMIN 1000 MG tablet Commonly known as: GLUCOPHAGE Take 1,000 mg by mouth 2 (two) times daily with a meal.   OLMESARTAN MEDOXOMIL PO Take 30 mg by mouth daily.   tamsulosin 0.4 MG Caps capsule Commonly known as: FLOMAX Take 0.4 mg by mouth daily.  Trulicity 1.5 MG/0.5ML Soaj Generic drug: Dulaglutide Inject 1.5 mg into the skin once a week.   TURMERIC PO Take 2,250 mg by mouth daily. 1000 mg daily   VITAMIN B-1 PO Take by mouth daily.   Zinc 50 MG Tabs Take 1 tablet by mouth daily at 6 (six) AM.        Allergies:  Allergies  Allergen Reactions   Bee Venom Anaphylaxis, Hives and Other (See Comments)   Hydrochlorothiazide  Other (See Comments)    Dizzy  Other Reaction(s): Dizziness   Iodinated Contrast Media Rash   Iodine Rash   Oxycodone  Nausea And Vomiting, Anxiety and Nausea Only    Cold sweats, n/v and out of it.   Povidone Iodine Other (See Comments)    UNKNOWN REACTION   Povidone-Iodine Other (See Comments)    UNKNOWN REACTION    Past Medical History, Surgical history, Social history, and Family  History were reviewed and updated.  Review of Systems: All other 10 point review of systems is negative.   Physical Exam:  height is 5' 6 (1.676 m) and weight is 154 lb 12.8 oz (70.2 kg). His oral temperature is 98 F (36.7 C). His blood pressure is 136/61 and his pulse is 62. His respiration is 17 and oxygen  saturation is 100%.   Wt Readings from Last 3 Encounters:  02/18/24 154 lb 12.8 oz (70.2 kg)  10/04/23 159 lb (72.1 kg)  09/14/23 163 lb (73.9 kg)    Ocular: Sclerae unicteric, pupils equal, round and reactive to light Ear-nose-throat: Oropharynx clear, dentition fair Lymphatic: No cervical or supraclavicular adenopathy Lungs no rales or rhonchi, good excursion bilaterally Heart regular rate and rhythm, no murmur appreciated Abd soft, nontender, positive bowel sounds MSK no focal spinal tenderness, no joint edema Neuro: non-focal, well-oriented, appropriate affect Breasts: Deferred   Lab Results  Component Value Date   WBC 10.1 02/18/2024   HGB 10.4 (L) 02/18/2024   HCT 32.0 (L) 02/18/2024   MCV 92.0 02/18/2024   PLT 375 02/18/2024   Lab Results  Component Value Date   FERRITIN 98 09/14/2023   IRON 72 09/14/2023   TIBC 315 09/14/2023   UIBC 243 09/14/2023   IRONPCTSAT 23 09/14/2023   Lab Results  Component Value Date   RETICCTPCT 1.5 02/18/2024   RBC 3.52 (L) 02/18/2024   RETICCTABS 45.43 03/31/2014   No results found for: KPAFRELGTCHN, LAMBDASER, KAPLAMBRATIO No results found for: KIMBERLY LE, IGMSERUM Lab Results  Component Value Date   TOTALPROTELP 6.8 04/22/2014   ALBUMINELP 56.0 04/22/2014   A1GS 4.9 04/22/2014   A2GS 12.8 (H) 04/22/2014   BETS 6.8 04/22/2014   BETA2SER 5.1 04/22/2014   GAMS 14.4 04/22/2014   MSPIKE NOT DET 04/22/2014   SPEI SEE NOTE 04/22/2014     Chemistry      Component Value Date/Time   NA 136 01/10/2024 1126   NA 134 (L) 03/31/2014 1432   K 4.8 01/10/2024 1126   K 4.6 03/31/2014 1432   CL 101 01/10/2024  1126   CO2 21 (L) 01/10/2024 1126   CO2 23 03/31/2014 1432   BUN 27 (H) 01/10/2024 1126   BUN 17.2 03/31/2014 1432   CREATININE 1.39 (H) 01/10/2024 1126   CREATININE 1.17 09/14/2023 1201   CREATININE 1.0 03/31/2014 1432      Component Value Date/Time   CALCIUM 9.7 01/10/2024 1126   CALCIUM 9.5 03/31/2014 1432   ALKPHOS 49 01/10/2024 1126   ALKPHOS 63 02/16/2014 1140   AST 24 01/10/2024  1126   AST 17 09/14/2023 1201   AST 15 02/16/2014 1140   ALT 14 01/10/2024 1126   ALT 11 09/14/2023 1201   ALT 12 02/16/2014 1140   BILITOT 0.5 01/10/2024 1126   BILITOT 0.3 09/14/2023 1201   BILITOT 0.38 02/16/2014 1140       Impression and Plan: Mr. Pitre is a very pleasant gentleman with history of both IDA and erythropoietin  deficiency anemia.  ESA given for Hgb 10.4.  Iron studies are pending. We will replace if needed.  Follow-up in 3 months.   Lauraine Pepper, NP 9/22/20252:36 PM

## 2024-02-18 NOTE — Patient Instructions (Signed)

## 2024-02-19 ENCOUNTER — Encounter: Payer: Self-pay | Admitting: *Deleted

## 2024-02-29 ENCOUNTER — Other Ambulatory Visit: Payer: Self-pay | Admitting: Hematology & Oncology

## 2024-03-27 DIAGNOSIS — E7841 Elevated Lipoprotein(a): Secondary | ICD-10-CM | POA: Diagnosis not present

## 2024-03-27 DIAGNOSIS — E785 Hyperlipidemia, unspecified: Secondary | ICD-10-CM | POA: Diagnosis not present

## 2024-03-27 DIAGNOSIS — E11319 Type 2 diabetes mellitus with unspecified diabetic retinopathy without macular edema: Secondary | ICD-10-CM | POA: Diagnosis not present

## 2024-03-27 DIAGNOSIS — M791 Myalgia, unspecified site: Secondary | ICD-10-CM | POA: Diagnosis not present

## 2024-03-27 DIAGNOSIS — T466X5A Adverse effect of antihyperlipidemic and antiarteriosclerotic drugs, initial encounter: Secondary | ICD-10-CM | POA: Diagnosis not present

## 2024-03-27 DIAGNOSIS — Z794 Long term (current) use of insulin: Secondary | ICD-10-CM | POA: Diagnosis not present

## 2024-03-27 DIAGNOSIS — I10 Essential (primary) hypertension: Secondary | ICD-10-CM | POA: Diagnosis not present

## 2024-04-14 DIAGNOSIS — R3912 Poor urinary stream: Secondary | ICD-10-CM | POA: Diagnosis not present

## 2024-04-14 DIAGNOSIS — R3914 Feeling of incomplete bladder emptying: Secondary | ICD-10-CM | POA: Diagnosis not present

## 2024-04-14 DIAGNOSIS — N401 Enlarged prostate with lower urinary tract symptoms: Secondary | ICD-10-CM | POA: Diagnosis not present

## 2024-04-28 DIAGNOSIS — E113311 Type 2 diabetes mellitus with moderate nonproliferative diabetic retinopathy with macular edema, right eye: Secondary | ICD-10-CM | POA: Diagnosis not present

## 2024-05-13 DIAGNOSIS — L570 Actinic keratosis: Secondary | ICD-10-CM | POA: Diagnosis not present

## 2024-05-13 DIAGNOSIS — Z85828 Personal history of other malignant neoplasm of skin: Secondary | ICD-10-CM | POA: Diagnosis not present

## 2024-05-13 DIAGNOSIS — Z85820 Personal history of malignant melanoma of skin: Secondary | ICD-10-CM | POA: Diagnosis not present

## 2024-05-13 DIAGNOSIS — L720 Epidermal cyst: Secondary | ICD-10-CM | POA: Diagnosis not present

## 2024-05-13 DIAGNOSIS — L821 Other seborrheic keratosis: Secondary | ICD-10-CM | POA: Diagnosis not present

## 2024-05-13 DIAGNOSIS — L812 Freckles: Secondary | ICD-10-CM | POA: Diagnosis not present

## 2024-05-19 ENCOUNTER — Inpatient Hospital Stay: Attending: Hematology & Oncology

## 2024-05-19 ENCOUNTER — Other Ambulatory Visit: Payer: Self-pay

## 2024-05-19 ENCOUNTER — Encounter: Payer: Self-pay | Admitting: Hematology & Oncology

## 2024-05-19 ENCOUNTER — Ambulatory Visit: Admitting: Hematology & Oncology

## 2024-05-19 ENCOUNTER — Ambulatory Visit

## 2024-05-19 VITALS — BP 128/78 | HR 61 | Temp 98.0°F | Resp 16 | Ht 66.0 in | Wt 156.0 lb

## 2024-05-19 DIAGNOSIS — E1122 Type 2 diabetes mellitus with diabetic chronic kidney disease: Secondary | ICD-10-CM | POA: Insufficient documentation

## 2024-05-19 DIAGNOSIS — D631 Anemia in chronic kidney disease: Secondary | ICD-10-CM

## 2024-05-19 DIAGNOSIS — D509 Iron deficiency anemia, unspecified: Secondary | ICD-10-CM | POA: Diagnosis not present

## 2024-05-19 DIAGNOSIS — D500 Iron deficiency anemia secondary to blood loss (chronic): Secondary | ICD-10-CM

## 2024-05-19 DIAGNOSIS — N189 Chronic kidney disease, unspecified: Secondary | ICD-10-CM | POA: Diagnosis present

## 2024-05-19 LAB — CBC WITH DIFFERENTIAL (CANCER CENTER ONLY)
Abs Immature Granulocytes: 0.03 K/uL (ref 0.00–0.07)
Basophils Absolute: 0.1 K/uL (ref 0.0–0.1)
Basophils Relative: 1 %
Eosinophils Absolute: 0.1 K/uL (ref 0.0–0.5)
Eosinophils Relative: 1 %
HCT: 36.8 % — ABNORMAL LOW (ref 39.0–52.0)
Hemoglobin: 12.2 g/dL — ABNORMAL LOW (ref 13.0–17.0)
Immature Granulocytes: 0 %
Lymphocytes Relative: 28 %
Lymphs Abs: 2.6 K/uL (ref 0.7–4.0)
MCH: 30.1 pg (ref 26.0–34.0)
MCHC: 33.2 g/dL (ref 30.0–36.0)
MCV: 90.9 fL (ref 80.0–100.0)
Monocytes Absolute: 1.1 K/uL — ABNORMAL HIGH (ref 0.1–1.0)
Monocytes Relative: 11 %
Neutro Abs: 5.5 K/uL (ref 1.7–7.7)
Neutrophils Relative %: 59 %
Platelet Count: 331 K/uL (ref 150–400)
RBC: 4.05 MIL/uL — ABNORMAL LOW (ref 4.22–5.81)
RDW: 13.8 % (ref 11.5–15.5)
WBC Count: 9.4 K/uL (ref 4.0–10.5)
nRBC: 0 % (ref 0.0–0.2)

## 2024-05-19 LAB — IRON AND IRON BINDING CAPACITY (CC-WL,HP ONLY)
Iron: 35 ug/dL — ABNORMAL LOW (ref 45–182)
Saturation Ratios: 12 % — ABNORMAL LOW (ref 17.9–39.5)
TIBC: 290 ug/dL (ref 250–450)
UIBC: 255 ug/dL

## 2024-05-19 LAB — CMP (CANCER CENTER ONLY)
ALT: 12 U/L (ref 0–44)
AST: 24 U/L (ref 15–41)
Albumin: 4.2 g/dL (ref 3.5–5.0)
Alkaline Phosphatase: 65 U/L (ref 38–126)
Anion gap: 14 (ref 5–15)
BUN: 31 mg/dL — ABNORMAL HIGH (ref 8–23)
CO2: 24 mmol/L (ref 22–32)
Calcium: 9.6 mg/dL (ref 8.9–10.3)
Chloride: 99 mmol/L (ref 98–111)
Creatinine: 1.71 mg/dL — ABNORMAL HIGH (ref 0.61–1.24)
GFR, Estimated: 39 mL/min — ABNORMAL LOW
Glucose, Bld: 150 mg/dL — ABNORMAL HIGH (ref 70–99)
Potassium: 5.2 mmol/L — ABNORMAL HIGH (ref 3.5–5.1)
Sodium: 137 mmol/L (ref 135–145)
Total Bilirubin: 0.3 mg/dL (ref 0.0–1.2)
Total Protein: 7 g/dL (ref 6.5–8.1)

## 2024-05-19 LAB — FERRITIN: Ferritin: 218 ng/mL (ref 24–336)

## 2024-05-19 LAB — RETICULOCYTES
Immature Retic Fract: 9.2 % (ref 2.3–15.9)
RBC.: 4.09 MIL/uL — ABNORMAL LOW (ref 4.22–5.81)
Retic Count, Absolute: 33.9 K/uL (ref 19.0–186.0)
Retic Ct Pct: 0.8 % (ref 0.4–3.1)

## 2024-05-19 NOTE — Progress Notes (Signed)
 " Hematology and Oncology Follow Up Visit  Dustin Mccarthy [id number] 03-09-1941 83 y.o. 05/19/2024   Principle Diagnosis:  Iron deficiency Anemia Erythropoietin  def Anemia   Current Therapy:        IV iron as indicated  -Injectafer  given on 02/08/2022 Aranesp  300 mcg sq for Hgb <11    Interim History:  Dustin Mccarthy is here today for follow-up.  He was last seen back in September.  At that time, he did get some Aranesp .  His iron studies were okay.  He is doing little bit better.  He had no episode of vertigo.  They said this was about a month or so ago.  He went to the ER for this.  They did the extensive workup on him.  Everything turned out okay.  He was then put on meclizine .  He has had no problems with bleeding.  There is been no change in bowel or bladder habits.  He may have little constipation.  He has had no leg swelling.  He has had no rashes.  He has had no cough.  He has had no fever.  He has had no headache.  It is now been about 2-1/2 years since his wife passed.  I know this is still quite challenging for him.  Overall, I would say that his performance status is probably ECOG 2.   Medications:  Allergies as of 05/19/2024       Reactions   Bee Venom Anaphylaxis, Hives, Other (See Comments)   Hydrochlorothiazide  Other (See Comments)   Dizzy Other Reaction(s): Dizziness   Iodinated Contrast Media Rash   Iodine Rash   Oxycodone  Nausea And Vomiting, Anxiety, Nausea Only   Cold sweats, n/v and out of it.   Povidone Iodine Other (See Comments)   UNKNOWN REACTION   Povidone-iodine Other (See Comments)   UNKNOWN REACTION        Medication List        Accurate as of May 19, 2024  1:36 PM. If you have any questions, ask your nurse or doctor.          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 Use 1 strip  to check glucose twice a day   APPLE CIDER VINEGAR PO Take by mouth. 250 mg daily   aspirin 81 MG chewable tablet every other day.   BASAGLAR KWIKPEN [place] Inject 8 Units into the skin at bedtime.   Cholecalciferol 50 MCG (2000 UT) Tabs Take 1 tablet by mouth daily.   cyanocobalamin  1000 MCG tablet Commonly known as: VITAMIN B12 Take 1,000 mcg by mouth. Every other day   ezetimibe 10 MG tablet Commonly known as: ZETIA 1 tablet Orally Once a day for 90 days   finasteride 5 MG tablet Commonly known as: PROSCAR Take 5 mg by mouth daily.   folic acid 1 MG tablet Commonly known as: FOLVITE every other day.   furosemide  20 MG tablet Commonly known as: LASIX  TAKE 1 TABLET(20 MG) BY MOUTH DAILY   Magnesium 400 MG Caps Take 400 mg by mouth daily.   meclizine  25 MG tablet Commonly known as: ANTIVERT  Take 1 tablet (25 mg total) by mouth 3 (three) times daily as needed for dizziness.   metFORMIN 1000 MG tablet Commonly known as: GLUCOPHAGE Take 1,000 mg by mouth 2 (two) times daily with  a meal.   OLMESARTAN MEDOXOMIL PO Take 30 mg by mouth daily.   tamsulosin 0.4 MG Caps capsule Commonly known as: FLOMAX Take 0.4 mg by mouth daily.   Trulicity 1.5 MG/0.5ML Soaj Generic drug: Dulaglutide Inject 1.5 mg into the skin once a week.   TURMERIC PO Take 2,250 mg by mouth daily. 1000 mg daily   VITAMIN B-1 PO Take by mouth daily.   Zinc 50 MG Tabs Take 1 tablet by mouth daily at 6 (six) AM.        Allergies:  Allergies  Allergen Reactions   Bee Venom Anaphylaxis, Hives and Other (See Comments)   Hydrochlorothiazide  Other (See Comments)    Dizzy  Other Reaction(s): Dizziness   Iodinated Contrast Media Rash   Iodine Rash   Oxycodone  Nausea And Vomiting, Anxiety and Nausea Only    Cold sweats, n/v and out of it.   Povidone Iodine Other (See Comments)    UNKNOWN REACTION   Povidone-Iodine Other (See Comments)    UNKNOWN REACTION    Past Medical History,  Surgical history, Social history, and Family History were reviewed and updated.  Review of Systems:  Review of Systems  Constitutional: Negative.   HENT: Negative.    Eyes: Negative.   Respiratory: Negative.    Cardiovascular: Negative.   Gastrointestinal: Negative.   Genitourinary: Negative.   Musculoskeletal: Negative.   Skin: Negative.   Neurological:  Positive for dizziness.  Endo/Heme/Allergies: Negative.      Physical Exam:  height is 5' 6 (1.676 m) and weight is 156 lb (70.8 kg). His oral temperature is 98 F (36.7 C). His blood pressure is 128/78 and his pulse is 61. His respiration is 16 and oxygen  saturation is 100%.   Wt Readings from Last 3 Encounters:  05/19/24 156 lb (70.8 kg)  02/18/24 154 lb 12.8 oz (70.2 kg)  10/04/23 159 lb (72.1 kg)   Physical Exam Vitals reviewed.  HENT:     Head: Normocephalic and atraumatic.  Eyes:     Pupils: Pupils are equal, round, and reactive to light.  Cardiovascular:     Rate and Rhythm: Normal rate and regular rhythm.     Heart sounds: Normal heart sounds.  Pulmonary:     Effort: Pulmonary effort is normal.     Breath sounds: Normal breath sounds.  Abdominal:     General: Bowel sounds are normal.     Palpations: Abdomen is soft.  Musculoskeletal:        General: No tenderness or deformity. Normal range of motion.     Cervical back: Normal range of motion.  Lymphadenopathy:     Cervical: No cervical adenopathy.  Skin:    General: Skin is warm and dry.     Findings: No erythema or rash.  Neurological:     Mental Status: He is alert and oriented to person, place, and time.  Psychiatric:        Behavior: Behavior normal.        Thought Content: Thought content normal.        Judgment: Judgment normal.      Lab Results  Component Value Date   WBC 9.4 05/19/2024   HGB 12.2 (L) 05/19/2024   HCT 36.8 (L) 05/19/2024   MCV 90.9 05/19/2024   PLT 331 05/19/2024   Lab Results  Component Value Date   FERRITIN 291  02/18/2024   IRON 84 02/18/2024   TIBC 315 02/18/2024   UIBC 231 02/18/2024   IRONPCTSAT 27 02/18/2024  Lab Results  Component Value Date   RETICCTPCT 0.8 05/19/2024   RBC 4.09 (L) 05/19/2024   RBC 4.05 (L) 05/19/2024   RETICCTABS 45.43 03/31/2014   No results found for: KPAFRELGTCHN, LAMBDASER, KAPLAMBRATIO No results found for: KIMBERLY LE, [facility] Lab Results  Component Value Date   TOTALPROTELP 6.8 04/22/2014   ALBUMINELP 56.0 04/22/2014   A1GS 4.9 04/22/2014   A2GS 12.8 (H) 04/22/2014   BETS 6.8 04/22/2014   BETA2SER 5.1 04/22/2014   GAMS 14.4 04/22/2014   MSPIKE NOT DET 04/22/2014   SPEI SEE NOTE 04/22/2014     Chemistry      Component Value Date/Time   NA 134 (L) 02/18/2024 1413   NA 134 (L) 03/31/2014 1432   K 4.7 02/18/2024 1413   K 4.6 03/31/2014 1432   CL 100 02/18/2024 1413   CO2 21 (L) 02/18/2024 1413   CO2 23 03/31/2014 1432   BUN 27 (H) 02/18/2024 1413   BUN 17.2 03/31/2014 1432   CREATININE 1.39 (H) 02/18/2024 1413   CREATININE 1.0 03/31/2014 1432      Component Value Date/Time   CALCIUM 9.3 02/18/2024 1413   CALCIUM 9.5 03/31/2014 1432   ALKPHOS 62 02/18/2024 1413   ALKPHOS 63 02/16/2014 1140   AST 21 02/18/2024 1413   AST 15 02/16/2014 1140   ALT 11 02/18/2024 1413   ALT 12 02/16/2014 1140   BILITOT 0.4 02/18/2024 1413   BILITOT 0.38 02/16/2014 1140       Impression and Plan: Dustin Mccarthy is a very pleasant gentleman with history of both IDA and erythropoietin  deficiency anemia.   His blood count looks fantastic.  I really do not think he is going need to have any iron or any kind of Aranesp .  I just feel bad that he is still missing his 4 wife who passed on 2-1/2 years ago.  He is trying to deal with a house out in [place] .  He wants to sell the house.  Hopefully, everything will work out okay for him.  We will plan to get him back in 3 months.  Will try to get him through the Winter.   Dustin JONELLE Crease,  MD 12/22/20251:36 PM  "

## 2024-05-20 ENCOUNTER — Other Ambulatory Visit: Payer: Self-pay | Admitting: Medical Oncology

## 2024-05-20 ENCOUNTER — Ambulatory Visit: Payer: Self-pay | Admitting: Hematology & Oncology

## 2024-06-02 ENCOUNTER — Inpatient Hospital Stay: Attending: Hematology & Oncology

## 2024-06-02 VITALS — BP 156/77 | HR 60 | Temp 97.7°F | Resp 18

## 2024-06-02 DIAGNOSIS — D509 Iron deficiency anemia, unspecified: Secondary | ICD-10-CM | POA: Diagnosis present

## 2024-06-02 DIAGNOSIS — D500 Iron deficiency anemia secondary to blood loss (chronic): Secondary | ICD-10-CM

## 2024-06-02 MED ORDER — SODIUM CHLORIDE 0.9 % IV SOLN: INTRAVENOUS | Status: DC

## 2024-06-02 MED ORDER — SODIUM CHLORIDE 0.9 % IV SOLN
1000.0000 mg | Freq: Once | INTRAVENOUS | Status: AC
  Administered 2024-06-02: 1000 mg via INTRAVENOUS
  Filled 2024-06-02: qty 10

## 2024-08-04 ENCOUNTER — Inpatient Hospital Stay

## 2024-08-04 ENCOUNTER — Inpatient Hospital Stay: Admitting: Family
